# Patient Record
Sex: Female | Born: 1980 | Race: White | Hispanic: Yes | Marital: Married | Smoking: Never smoker
Health system: Southern US, Community
[De-identification: ages and names within clinical notes are randomized; demographics above are authoritative.]

## PROBLEM LIST (undated history)

## (undated) ENCOUNTER — Inpatient Hospital Stay (HOSPITAL_COMMUNITY): Payer: Self-pay

## (undated) DIAGNOSIS — F419 Anxiety disorder, unspecified: Secondary | ICD-10-CM

## (undated) DIAGNOSIS — O24419 Gestational diabetes mellitus in pregnancy, unspecified control: Secondary | ICD-10-CM

## (undated) DIAGNOSIS — F329 Major depressive disorder, single episode, unspecified: Secondary | ICD-10-CM

## (undated) DIAGNOSIS — F32A Depression, unspecified: Secondary | ICD-10-CM

## (undated) HISTORY — PX: APPENDECTOMY: SHX54

---

## 2005-08-21 ENCOUNTER — Emergency Department (HOSPITAL_COMMUNITY): Admission: EM | Admit: 2005-08-21 | Discharge: 2005-08-21 | Payer: Self-pay | Admitting: Emergency Medicine

## 2005-11-05 ENCOUNTER — Inpatient Hospital Stay (HOSPITAL_COMMUNITY): Admission: AD | Admit: 2005-11-05 | Discharge: 2005-11-05 | Payer: Self-pay | Admitting: Obstetrics and Gynecology

## 2006-01-14 ENCOUNTER — Inpatient Hospital Stay (HOSPITAL_COMMUNITY): Admission: AD | Admit: 2006-01-14 | Discharge: 2006-01-14 | Payer: Self-pay | Admitting: *Deleted

## 2006-05-25 ENCOUNTER — Emergency Department (HOSPITAL_COMMUNITY): Admission: EM | Admit: 2006-05-25 | Discharge: 2006-05-26 | Payer: Self-pay | Admitting: Emergency Medicine

## 2006-07-22 ENCOUNTER — Inpatient Hospital Stay (HOSPITAL_COMMUNITY): Admission: AD | Admit: 2006-07-22 | Discharge: 2006-07-22 | Payer: Self-pay | Admitting: Obstetrics and Gynecology

## 2007-06-04 ENCOUNTER — Ambulatory Visit: Payer: Self-pay | Admitting: Internal Medicine

## 2007-06-04 LAB — CONVERTED CEMR LAB
Basophils Absolute: 0 10*3/uL (ref 0.0–0.1)
CO2: 22 meq/L (ref 19–32)
Creatinine, Ser: 0.63 mg/dL (ref 0.40–1.20)
Eosinophils Relative: 1 % (ref 0–5)
Lymphocytes Relative: 36 % (ref 12–46)
Lymphs Abs: 3.3 10*3/uL (ref 0.7–3.3)
Monocytes Relative: 7 % (ref 3–11)
Neutro Abs: 5.1 10*3/uL (ref 1.7–7.7)
Neutrophils Relative %: 56 % (ref 43–77)
Potassium: 4.1 meq/L (ref 3.5–5.3)
RBC: 4.33 M/uL (ref 3.87–5.11)
Sodium: 138 meq/L (ref 135–145)
WBC: 9.1 10*3/uL (ref 4.0–10.5)

## 2007-06-07 ENCOUNTER — Ambulatory Visit: Payer: Self-pay | Admitting: *Deleted

## 2008-05-22 ENCOUNTER — Ambulatory Visit: Payer: Self-pay | Admitting: Family Medicine

## 2008-07-12 ENCOUNTER — Ambulatory Visit: Payer: Self-pay | Admitting: Internal Medicine

## 2008-07-12 ENCOUNTER — Encounter: Payer: Self-pay | Admitting: Family Medicine

## 2008-07-12 LAB — CONVERTED CEMR LAB
ALT: 16 units/L (ref 0–35)
Albumin: 4.7 g/dL (ref 3.5–5.2)
Alkaline Phosphatase: 103 units/L (ref 39–117)
BUN: 13 mg/dL (ref 6–23)
CO2: 23 meq/L (ref 19–32)
Calcium: 9.2 mg/dL (ref 8.4–10.5)
Glucose, Bld: 90 mg/dL (ref 70–99)
Hemoglobin: 14.3 g/dL (ref 12.0–15.0)
Lymphocytes Relative: 42 % (ref 12–46)
MCHC: 34.3 g/dL (ref 30.0–36.0)
Monocytes Absolute: 0.5 10*3/uL (ref 0.1–1.0)
Monocytes Relative: 7 % (ref 3–12)
Neutro Abs: 3.9 10*3/uL (ref 1.7–7.7)
Neutrophils Relative %: 49 % (ref 43–77)
Platelets: 253 10*3/uL (ref 150–400)
RDW: 12.3 % (ref 11.5–15.5)
Total CHOL/HDL Ratio: 4.2
Total Protein: 7.2 g/dL (ref 6.0–8.3)
VLDL: 35 mg/dL (ref 0–40)
WBC: 7.8 10*3/uL (ref 4.0–10.5)

## 2008-07-21 ENCOUNTER — Encounter: Payer: Self-pay | Admitting: Family Medicine

## 2008-07-21 ENCOUNTER — Ambulatory Visit: Payer: Self-pay | Admitting: Internal Medicine

## 2008-07-21 LAB — CONVERTED CEMR LAB
Chlamydia, DNA Probe: NEGATIVE
GC Probe Amp, Genital: NEGATIVE

## 2009-11-21 ENCOUNTER — Ambulatory Visit: Payer: Self-pay | Admitting: Obstetrics and Gynecology

## 2009-11-30 ENCOUNTER — Ambulatory Visit (HOSPITAL_COMMUNITY): Admission: RE | Admit: 2009-11-30 | Discharge: 2009-11-30 | Payer: Self-pay | Admitting: Obstetrics & Gynecology

## 2009-12-19 ENCOUNTER — Ambulatory Visit: Payer: Self-pay | Admitting: Obstetrics and Gynecology

## 2009-12-20 ENCOUNTER — Encounter: Payer: Self-pay | Admitting: Advanced Practice Midwife

## 2010-10-17 ENCOUNTER — Emergency Department (HOSPITAL_COMMUNITY)
Admission: EM | Admit: 2010-10-17 | Discharge: 2010-10-18 | Payer: Self-pay | Source: Home / Self Care | Admitting: Emergency Medicine

## 2010-10-21 LAB — URINALYSIS, ROUTINE W REFLEX MICROSCOPIC
Ketones, ur: NEGATIVE mg/dL
Protein, ur: NEGATIVE mg/dL
Specific Gravity, Urine: 1.021 (ref 1.005–1.030)
Urine Glucose, Fasting: >1000 mg/dL — AB
pH: 7 (ref 5.0–8.0)

## 2010-10-21 LAB — BASIC METABOLIC PANEL
BUN: 9 mg/dL (ref 6–23)
CO2: 29 mEq/L (ref 19–32)
Creatinine, Ser: 0.83 mg/dL (ref 0.4–1.2)
GFR calc non Af Amer: >60 mL/min (ref >60)
Sodium: 139 mEq/L (ref 135–145)

## 2010-10-21 LAB — DIFFERENTIAL
Basophils Absolute: 0 10*3/uL (ref 0.0–0.1)
Lymphs Abs: 3.8 10*3/uL (ref 0.7–4.0)
Monocytes Absolute: 1 10*3/uL (ref 0.1–1.0)

## 2010-10-21 LAB — CBC
Hemoglobin: 13.2 g/dL (ref 12.0–15.0)
MCH: 30.6 pg (ref 26.0–34.0)
MCHC: 35.6 g/dL (ref 30.0–36.0)
MCV: 86.1 fL (ref 78.0–100.0)
RBC: 4.31 MIL/uL (ref 3.87–5.11)
RDW: 12.5 % (ref 11.5–15.5)

## 2010-10-21 LAB — POCT PREGNANCY, URINE: Preg Test, Ur: NEGATIVE

## 2010-10-21 LAB — WET PREP, GENITAL: Trich, Wet Prep: NONE SEEN

## 2010-10-21 LAB — URINE MICROSCOPIC-ADD ON

## 2010-12-22 LAB — POCT URINALYSIS DIP (DEVICE)
Glucose, UA: NEGATIVE mg/dL
Nitrite: NEGATIVE
Specific Gravity, Urine: >=1.03 (ref 1.005–1.030)
Urobilinogen, UA: 0.2 mg/dL (ref 0.0–1.0)
pH: 5 (ref 5.0–8.0)

## 2011-05-16 ENCOUNTER — Emergency Department (HOSPITAL_COMMUNITY)
Admission: EM | Admit: 2011-05-16 | Discharge: 2011-05-17 | Disposition: A | Discharge status: Home / Self Care | Payer: Self-pay | Attending: Emergency Medicine | Admitting: Emergency Medicine

## 2011-05-16 DIAGNOSIS — M533 Sacrococcygeal disorders, not elsewhere classified: Secondary | ICD-10-CM | POA: Insufficient documentation

## 2011-05-16 DIAGNOSIS — R350 Frequency of micturition: Secondary | ICD-10-CM | POA: Insufficient documentation

## 2011-05-16 DIAGNOSIS — A499 Bacterial infection, unspecified: Secondary | ICD-10-CM | POA: Insufficient documentation

## 2011-05-16 DIAGNOSIS — M545 Low back pain, unspecified: Secondary | ICD-10-CM | POA: Insufficient documentation

## 2011-05-16 DIAGNOSIS — R109 Unspecified abdominal pain: Secondary | ICD-10-CM | POA: Insufficient documentation

## 2011-05-16 DIAGNOSIS — B9689 Other specified bacterial agents as the cause of diseases classified elsewhere: Secondary | ICD-10-CM | POA: Insufficient documentation

## 2011-05-16 DIAGNOSIS — R300 Dysuria: Secondary | ICD-10-CM | POA: Insufficient documentation

## 2011-05-16 DIAGNOSIS — N760 Acute vaginitis: Secondary | ICD-10-CM | POA: Insufficient documentation

## 2011-05-16 LAB — URINALYSIS, ROUTINE W REFLEX MICROSCOPIC
Glucose, UA: NEGATIVE mg/dL
Nitrite: NEGATIVE

## 2011-05-16 LAB — URINE MICROSCOPIC-ADD ON

## 2011-05-16 LAB — POCT PREGNANCY, URINE: Preg Test, Ur: NEGATIVE

## 2011-05-17 ENCOUNTER — Emergency Department (HOSPITAL_COMMUNITY): Payer: Self-pay

## 2011-05-17 LAB — WET PREP, GENITAL
Trich, Wet Prep: NONE SEEN
Yeast Wet Prep HPF POC: NONE SEEN

## 2011-05-19 LAB — GC/CHLAMYDIA PROBE AMP, GENITAL: GC Probe Amp, Genital: NEGATIVE

## 2011-09-30 NOTE — L&D Delivery Note (Signed)
Delivery Note At 2:33 PM a viable female was delivered via  (Presentation: ;  ).  APGAR: , ; weight .   Placenta status: , .  Cord:  with the following complications: .  Cord pH: not done  Anesthesia:   Episiotomy:  Lacerations:  Suture Repair: 2.0 Est. Blood Loss (mL):   Mom to postpartum.  Baby to nursery-stable.  MARSHALL,BERNARD A 06/28/2012, 2:40 PM

## 2011-11-07 ENCOUNTER — Encounter (HOSPITAL_COMMUNITY): Payer: Self-pay | Admitting: *Deleted

## 2011-11-07 ENCOUNTER — Inpatient Hospital Stay (HOSPITAL_COMMUNITY)
Admission: AD | Admit: 2011-11-07 | Discharge: 2011-11-07 | Disposition: A | Discharge status: Home / Self Care | Payer: Self-pay | Source: Ambulatory Visit | Attending: Obstetrics and Gynecology | Admitting: Obstetrics and Gynecology

## 2011-11-07 ENCOUNTER — Inpatient Hospital Stay (HOSPITAL_COMMUNITY): Payer: Self-pay

## 2011-11-07 DIAGNOSIS — O99891 Other specified diseases and conditions complicating pregnancy: Secondary | ICD-10-CM | POA: Insufficient documentation

## 2011-11-07 DIAGNOSIS — O26899 Other specified pregnancy related conditions, unspecified trimester: Secondary | ICD-10-CM

## 2011-11-07 DIAGNOSIS — R109 Unspecified abdominal pain: Secondary | ICD-10-CM | POA: Insufficient documentation

## 2011-11-07 HISTORY — DX: Major depressive disorder, single episode, unspecified: F32.9

## 2011-11-07 HISTORY — DX: Depression, unspecified: F32.A

## 2011-11-07 HISTORY — DX: Anxiety disorder, unspecified: F41.9

## 2011-11-07 LAB — URINALYSIS, ROUTINE W REFLEX MICROSCOPIC
Bilirubin Urine: NEGATIVE
Ketones, ur: NEGATIVE mg/dL
Leukocytes, UA: NEGATIVE
Nitrite: NEGATIVE
Protein, ur: NEGATIVE mg/dL
Urobilinogen, UA: 0.2 mg/dL (ref 0.0–1.0)
pH: 5.5 (ref 5.0–8.0)

## 2011-11-07 LAB — CBC
HCT: 37.8 % (ref 36.0–46.0)
Hemoglobin: 13.6 g/dL (ref 12.0–15.0)
MCH: 31 pg (ref 26.0–34.0)
MCV: 86.1 fL (ref 78.0–100.0)
Platelets: 243 10*3/uL (ref 150–400)
RBC: 4.39 MIL/uL (ref 3.87–5.11)
WBC: 14.4 10*3/uL — ABNORMAL HIGH (ref 4.0–10.5)

## 2011-11-07 NOTE — ED Provider Notes (Signed)
History   Pt presents today c/o lower abd pain that is worse with movement. She denies fever, vag dc, bleeding, or any other problems at this time.  Chief Complaint  Patient presents with  . Abdominal Pain   HPI  OB History    Grav Para Term Preterm Abortions TAB SAB Ect Mult Living   3 2 1 1      2       Past Medical History  Diagnosis Date  . Preterm labor   . Depression   . Anxiety     Past Surgical History  Procedure Date  . Appendectomy   . Appendectomy     Family History  Problem Relation Age of Onset  . Anesthesia problems Neg Hx     History  Substance Use Topics  . Smoking status: Not on file  . Smokeless tobacco: Not on file  . Alcohol Use:     Allergies: No Known Allergies  No prescriptions prior to admission    Review of Systems  Constitutional: Negative for fever and chills.  Eyes: Negative for blurred vision and double vision.  Cardiovascular: Negative for chest pain and palpitations.  Gastrointestinal: Positive for abdominal pain. Negative for nausea, vomiting, diarrhea and constipation.  Genitourinary: Negative for dysuria, urgency, frequency and hematuria.  Neurological: Negative for dizziness and headaches.  Psychiatric/Behavioral: Negative for depression and suicidal ideas.   Physical Exam   Blood pressure 112/75, pulse 85, temperature 99.1 F (37.3 C), temperature source Oral, resp. rate 20, height 5' 1.5" (1.562 m), weight 140 lb (63.504 kg), last menstrual period 09/26/2011, SpO2 100.00%.  Physical Exam  Nursing note and vitals reviewed. Constitutional: She is oriented to person, place, and time. She appears well-developed and well-nourished. No distress.  HENT:  Head: Normocephalic and atraumatic.  Eyes: EOM are normal. Pupils are equal, round, and reactive to light.  GI: Soft. She exhibits no distension and no mass. There is tenderness. There is no rebound and no guarding.  Genitourinary: No bleeding around the vagina. No vaginal  discharge found.       Cervix Lg/closed. Pt slightly tender over uterus. No adnexal masses.  Neurological: She is alert and oriented to person, place, and time.  Skin: Skin is warm and dry. She is not diaphoretic.  Psychiatric: She has a normal mood and affect. Her behavior is normal. Judgment and thought content normal.    MAU Course  Procedures  Results for orders placed during the hospital encounter of 11/07/11 (from the past 24 hour(s))  URINALYSIS, ROUTINE W REFLEX MICROSCOPIC     Status: Abnormal   Collection Time   11/07/11  1:20 PM      Component Value Range   Color, Urine YELLOW  YELLOW    APPearance HAZY (*) CLEAR    Specific Gravity, Urine 1.015  1.005 - 1.030    pH 5.5  5.0 - 8.0    Glucose, UA NEGATIVE  NEGATIVE (mg/dL)   Hgb urine dipstick SMALL (*) NEGATIVE    Bilirubin Urine NEGATIVE  NEGATIVE    Ketones, ur NEGATIVE  NEGATIVE (mg/dL)   Protein, ur NEGATIVE  NEGATIVE (mg/dL)   Urobilinogen, UA 0.2  0.0 - 1.0 (mg/dL)   Nitrite NEGATIVE  NEGATIVE    Leukocytes, UA NEGATIVE  NEGATIVE   URINE MICROSCOPIC-ADD ON     Status: Abnormal   Collection Time   11/07/11  1:20 PM      Component Value Range   Squamous Epithelial / LPF MANY (*) RARE  WBC, UA 0-2  <3 (WBC/hpf)   Bacteria, UA FEW (*) RARE   POCT PREGNANCY, URINE     Status: Abnormal   Collection Time   11/07/11  1:30 PM      Component Value Range   Preg Test, Ur POSITIVE (*) NEGATIVE   ABO/RH     Status: Normal   Collection Time   11/07/11  2:34 PM      Component Value Range   ABO/RH(D) B POS    CBC     Status: Abnormal   Collection Time   11/07/11  2:34 PM      Component Value Range   WBC 14.4 (*) 4.0 - 10.5 (K/uL)   RBC 4.39  3.87 - 5.11 (MIL/uL)   Hemoglobin 13.6  12.0 - 15.0 (g/dL)   HCT 16.1  09.6 - 04.5 (%)   MCV 86.1  78.0 - 100.0 (fL)   MCH 31.0  26.0 - 34.0 (pg)   MCHC 36.0  30.0 - 36.0 (g/dL)   RDW 40.9  81.1 - 91.4 (%)   Platelets 243  150 - 400 (K/uL)    US shows IUGS with yolk sac. No  adnexal masses.  Assessment and Plan  Pain in preg: discussed with pt at length. She is to begin prenatal care ASAP. Discussed diet, activity, risks, and precautions.  Clinton Gallant. Agripina Guyette III, DrHSc, MPAS, PA-C  11/07/2011, 4:01 PM   Henrietta Hoover, PA 11/07/11 1607

## 2011-11-07 NOTE — Progress Notes (Addendum)
Went to Dr [facility] 8 days ago, was told threatened miscarriage.  No bleeding.  abd pain, cramping and pain in back.   Blood was detected in urine.   implanon removed Dec 10.  Stools are soft, frequent.  Urine has a strong smell, denies pain or burning.

## 2011-11-08 NOTE — ED Provider Notes (Signed)
Agree with above note.  Michelle Cross 11/08/2011 6:07 AM   

## 2012-01-20 LAB — OB RESULTS CONSOLE ABO/RH

## 2012-01-20 LAB — OB RESULTS CONSOLE RUBELLA ANTIBODY, IGM: Rubella: IMMUNE

## 2012-01-20 LAB — OB RESULTS CONSOLE GC/CHLAMYDIA: Gonorrhea: NEGATIVE

## 2012-01-20 LAB — OB RESULTS CONSOLE ANTIBODY SCREEN: Antibody Screen: NEGATIVE

## 2012-01-20 LAB — OB RESULTS CONSOLE HEPATITIS B SURFACE ANTIGEN: Hepatitis B Surface Ag: NEGATIVE

## 2012-04-15 ENCOUNTER — Ambulatory Visit (HOSPITAL_COMMUNITY)
Admission: RE | Admit: 2012-04-15 | Discharge: 2012-04-15 | Disposition: A | Discharge status: Home / Self Care | Payer: Self-pay | Source: Ambulatory Visit | Attending: Obstetrics | Admitting: Obstetrics

## 2012-04-15 ENCOUNTER — Other Ambulatory Visit: Payer: Self-pay | Admitting: Obstetrics

## 2012-04-15 ENCOUNTER — Other Ambulatory Visit: Payer: Self-pay

## 2012-04-15 ENCOUNTER — Encounter: Payer: Self-pay | Attending: Obstetrics | Admitting: Dietician

## 2012-04-15 DIAGNOSIS — O99810 Abnormal glucose complicating pregnancy: Secondary | ICD-10-CM | POA: Insufficient documentation

## 2012-04-15 DIAGNOSIS — Z713 Dietary counseling and surveillance: Secondary | ICD-10-CM | POA: Insufficient documentation

## 2012-04-16 NOTE — ED Notes (Signed)
Diabetes Education: 04/15/2012  Hispanic patient of Dr. Francoise Ceo seen for counseling for GDM.  Speaks some English, but needs for the educational session to progress slowly and simply for her comprehension.  She is a G3 L2.  Gives a history of GDM with her last pregnancy.  Does not recall the diet.  Does noted that she did not take any medication for blood glucose, but that her baby had low blood glucose following the delivery.  She has a positive history of diabetes.  EDD is 07/02/2012.  HT: 60.45 inches  WT: 153.2 lb  Currently at 29 weeks.  Completed the review of the carb restricted diet for GDM, blood glucose monitoring and post-delivery self-care.  Provided handouts in Spanish:  Nutrition, Diabetes and Pregnancy and Carbohydrate Counting.  She is un-insured and we reviewed blood glucose monitoring with my demonstration meter and her glucose was 124 mg at 1 hr and 45 minutes post-lunch.  Instructed to purchase the Reli-On meeter and strips from [employer].  The meter kit, 100 strips and 100 lancets would cost approximately $48.00.  Provided brochure to facilitate the purchase.  She was instructed to call in her blood glucose readings to [facility] to Humphrey Rolls, RN at [phone] on Monday July 22.  Letter to Dr. Gaynell Face to follow.  Maggie Lennix Kneisel, RN, RD, CDE

## 2012-04-19 ENCOUNTER — Other Ambulatory Visit: Payer: Self-pay

## 2012-04-20 ENCOUNTER — Telehealth (HOSPITAL_COMMUNITY): Payer: Self-pay | Admitting: *Deleted

## 2012-04-20 NOTE — Telephone Encounter (Signed)
Pt called on Monday after office hours to report blood sugars.  Had a friend "Lurena Joiner" call as the pt does not speak english.  Blood sugars recorded on log and showed to Dr. Otho Perl.  Will monitor blood sugars for one more week.

## 2012-04-23 NOTE — Progress Notes (Signed)
MFM Note  [name] is a 31 year old G3P2 Hispanic female at 34 weeks who presents for consultation regarding her new diagnosis of gestational diabetes. The pregnancy has been uncomplicated so far. Her 3 hour OGTT results were as follows: 88, 224,194 and 102.  OB history:  G1: 2007, diet controlled GDM, preterm labor and vaginal delivery at 36 weeks, female, 6+2, [place] G2: 2009, uncomplicated, SVD, female, 9+2, [place] G3: current pregnancy  Medical history: none Surgical history: appendectomy Allergies: none Social history: works at home; denies tob/ETOH/drug use Family history: no congenital anomalies and no inheritable disorders  Assessment: 1) IUP at 29 weeks 2) Gestational diabetes 3) H/O LGA infant  [name] had diabetic teaching today by [facility]. She understands the goals of therapy include fasting blood sugars less than 90 mg/dl and 2-hour postprandial less than 120 mg/dl with avoidance of hypoglycemia. As pregnancy progresses if diet therapy is not successful in achieving adequate glycemic control, she will require medical therapy. I recommend first starting glyburide 2.5 mg orally with breakfast and increasing as needed to reach desired glycemic goals. If the maximum daily dose of glyburide is reached (10 mg bid) and glycemic control remains inadequate, the patient should be stared on insulin. If the patient requires medical treatment of her diabetes then fetal surveillance should be instituted with interval serial growth ultrasounds every 4 weeks and twice weekly antepartum testing beginning at 32 weeks.   We reviewed that the goal of good glycemic control is to prevent macrosomia and the subsequent complications such as birth trauma, increased need for C/S and metabolic disturbances in the neonate.  [name] will start the diabetic diet and begin checking fasting and 2 hour postprandial blood sugars. She was asked to contact us within a  week with her BSs.  It was a pleasure meeting [name].  (Face-to-face consultation with patient: 15 min)

## 2012-04-26 ENCOUNTER — Other Ambulatory Visit: Payer: Self-pay

## 2012-04-27 ENCOUNTER — Telehealth (HOSPITAL_COMMUNITY): Payer: Self-pay | Admitting: *Deleted

## 2012-04-27 NOTE — Telephone Encounter (Signed)
Blood sugars called in by pt's friend Lurena Joiner) as pt does not speak english.  Reviewed BSG's with Dr. Claudean Severance.  Will continue to monitor sugars for one more week.  Instructed to call sugars in again next week.  See scanned reports for log of blood sugars.

## 2012-05-03 ENCOUNTER — Other Ambulatory Visit: Payer: Self-pay

## 2012-05-03 ENCOUNTER — Telehealth (HOSPITAL_COMMUNITY): Payer: Self-pay | Admitting: *Deleted

## 2012-05-03 NOTE — Telephone Encounter (Signed)
Pt's friend (rebecca) called in blood sugars.  Reviewed blood sugars with Dr. Claudean Severance.  Will begin glyburide 2.5mg  bid.  Prescription called into [facility] pharmacy on [address].  Spoke with pharmacist about pt not speaking english and requested 2.5mg  tablets.  Called Lurena Joiner back and instructed the patient to take one tablet in am with breakfast and one tablet at night with dinner.  Verbalized understanding.  Lurena Joiner will call back in the morning to schedule appointment next week for twice weekly NST's.  See scanned reports for blood sugar log.

## 2012-05-04 ENCOUNTER — Telehealth (HOSPITAL_COMMUNITY): Payer: Self-pay | Admitting: *Deleted

## 2012-05-04 NOTE — Telephone Encounter (Signed)
Lurena Joiner and Otilio Miu called over speaker phone.  Appointment made for 8/12 at 10am.  Interpreter requested for appointment. Instructed to bring copy of blood sugars with her to appointment.  Pt verbalized understanding.

## 2012-05-10 ENCOUNTER — Ambulatory Visit (HOSPITAL_COMMUNITY)
Admission: RE | Admit: 2012-05-10 | Discharge: 2012-05-10 | Disposition: A | Discharge status: Home / Self Care | Payer: Self-pay | Source: Ambulatory Visit | Attending: Obstetrics | Admitting: Obstetrics

## 2012-05-10 DIAGNOSIS — O99810 Abnormal glucose complicating pregnancy: Secondary | ICD-10-CM | POA: Insufficient documentation

## 2012-05-10 DIAGNOSIS — Z8751 Personal history of pre-term labor: Secondary | ICD-10-CM | POA: Insufficient documentation

## 2012-05-10 NOTE — ED Notes (Signed)
Reviewed Blood sugars with Dr. Sherrie George.  Will continued glyburide 2.5mg  bid.  Interpreter with pt discussing blood sugar management. Verbalized understanding. See scanned documents for blood sugar logs.

## 2012-05-11 ENCOUNTER — Other Ambulatory Visit (HOSPITAL_COMMUNITY): Payer: Self-pay | Admitting: Obstetrics

## 2012-05-11 DIAGNOSIS — O24419 Gestational diabetes mellitus in pregnancy, unspecified control: Secondary | ICD-10-CM

## 2012-05-13 ENCOUNTER — Ambulatory Visit (HOSPITAL_COMMUNITY)
Admission: RE | Admit: 2012-05-13 | Discharge: 2012-05-13 | Disposition: A | Discharge status: Home / Self Care | Payer: Self-pay | Source: Ambulatory Visit | Attending: Obstetrics | Admitting: Obstetrics

## 2012-05-13 VITALS — BP 100/51 | HR 89 | Wt 152.0 lb

## 2012-05-13 DIAGNOSIS — O24419 Gestational diabetes mellitus in pregnancy, unspecified control: Secondary | ICD-10-CM

## 2012-05-13 DIAGNOSIS — O99810 Abnormal glucose complicating pregnancy: Secondary | ICD-10-CM | POA: Insufficient documentation

## 2012-05-13 DIAGNOSIS — Z8751 Personal history of pre-term labor: Secondary | ICD-10-CM | POA: Insufficient documentation

## 2012-05-13 NOTE — Progress Notes (Signed)
Michelle Cross was seen for ultrasound appointment today.  Please see [facility]-OBGYN report for details.

## 2012-05-17 ENCOUNTER — Ambulatory Visit (HOSPITAL_COMMUNITY)
Admission: RE | Admit: 2012-05-17 | Discharge: 2012-05-17 | Disposition: A | Discharge status: Home / Self Care | Payer: Self-pay | Source: Ambulatory Visit | Attending: Obstetrics | Admitting: Obstetrics

## 2012-05-17 ENCOUNTER — Other Ambulatory Visit: Payer: Self-pay

## 2012-05-17 VITALS — BP 96/57 | HR 88 | Wt 153.0 lb

## 2012-05-17 DIAGNOSIS — O99810 Abnormal glucose complicating pregnancy: Secondary | ICD-10-CM | POA: Insufficient documentation

## 2012-05-17 DIAGNOSIS — O24419 Gestational diabetes mellitus in pregnancy, unspecified control: Secondary | ICD-10-CM

## 2012-05-17 DIAGNOSIS — O368390 Maternal care for abnormalities of the fetal heart rate or rhythm, unspecified trimester, not applicable or unspecified: Secondary | ICD-10-CM | POA: Insufficient documentation

## 2012-05-20 ENCOUNTER — Ambulatory Visit (HOSPITAL_COMMUNITY)
Admission: RE | Admit: 2012-05-20 | Discharge: 2012-05-20 | Disposition: A | Discharge status: Home / Self Care | Payer: Self-pay | Source: Ambulatory Visit | Attending: Obstetrics | Admitting: Obstetrics

## 2012-05-20 VITALS — BP 105/55 | HR 90 | Wt 154.0 lb

## 2012-05-20 DIAGNOSIS — O24419 Gestational diabetes mellitus in pregnancy, unspecified control: Secondary | ICD-10-CM

## 2012-05-20 DIAGNOSIS — Z8751 Personal history of pre-term labor: Secondary | ICD-10-CM | POA: Insufficient documentation

## 2012-05-20 DIAGNOSIS — O99810 Abnormal glucose complicating pregnancy: Secondary | ICD-10-CM | POA: Insufficient documentation

## 2012-05-20 NOTE — Progress Notes (Signed)
Michelle Cross  was seen today for an ultrasound appointment.  See full report in [facility].  Alpha Gula, MD  Single living intrauterine pregnancy at 33 weeks 6 days. Interval fetal growth is appropriate (70th %tile) Normal amniotic fluid volume.  Recommend continued twice weekly fetal testing due to A2 GDM. Follow up growth scan in 3-4 weeks.

## 2012-05-24 ENCOUNTER — Ambulatory Visit (HOSPITAL_COMMUNITY)
Admission: RE | Admit: 2012-05-24 | Discharge: 2012-05-24 | Disposition: A | Discharge status: Home / Self Care | Payer: Self-pay | Source: Ambulatory Visit | Attending: Obstetrics | Admitting: Obstetrics

## 2012-05-24 ENCOUNTER — Other Ambulatory Visit: Payer: Self-pay

## 2012-05-24 VITALS — BP 92/53 | HR 87 | Wt 152.5 lb

## 2012-05-24 DIAGNOSIS — O24419 Gestational diabetes mellitus in pregnancy, unspecified control: Secondary | ICD-10-CM

## 2012-05-24 DIAGNOSIS — O99810 Abnormal glucose complicating pregnancy: Secondary | ICD-10-CM | POA: Insufficient documentation

## 2012-05-24 DIAGNOSIS — Z8751 Personal history of pre-term labor: Secondary | ICD-10-CM | POA: Insufficient documentation

## 2012-05-24 NOTE — ED Notes (Signed)
See scanned documents for blood sugar logs. 

## 2012-05-26 ENCOUNTER — Other Ambulatory Visit (HOSPITAL_COMMUNITY): Payer: Self-pay | Admitting: Obstetrics

## 2012-05-26 DIAGNOSIS — O24419 Gestational diabetes mellitus in pregnancy, unspecified control: Secondary | ICD-10-CM

## 2012-05-27 ENCOUNTER — Ambulatory Visit (HOSPITAL_COMMUNITY)
Admission: RE | Admit: 2012-05-27 | Discharge: 2012-05-27 | Disposition: A | Discharge status: Home / Self Care | Payer: Self-pay | Source: Ambulatory Visit | Attending: Obstetrics | Admitting: Obstetrics

## 2012-05-27 VITALS — BP 90/56 | HR 83 | Wt 152.0 lb

## 2012-05-27 DIAGNOSIS — O24419 Gestational diabetes mellitus in pregnancy, unspecified control: Secondary | ICD-10-CM

## 2012-05-27 DIAGNOSIS — O99810 Abnormal glucose complicating pregnancy: Secondary | ICD-10-CM | POA: Insufficient documentation

## 2012-05-27 DIAGNOSIS — Z8751 Personal history of pre-term labor: Secondary | ICD-10-CM | POA: Insufficient documentation

## 2012-05-31 ENCOUNTER — Other Ambulatory Visit: Payer: Self-pay

## 2012-06-01 ENCOUNTER — Other Ambulatory Visit (HOSPITAL_COMMUNITY): Payer: Self-pay | Admitting: Obstetrics

## 2012-06-01 ENCOUNTER — Other Ambulatory Visit: Payer: Self-pay

## 2012-06-01 ENCOUNTER — Ambulatory Visit (HOSPITAL_COMMUNITY)
Admission: RE | Admit: 2012-06-01 | Discharge: 2012-06-01 | Disposition: A | Discharge status: Home / Self Care | Payer: Self-pay | Source: Ambulatory Visit | Attending: Obstetrics | Admitting: Obstetrics

## 2012-06-01 VITALS — BP 98/60 | HR 95 | Wt 153.0 lb

## 2012-06-01 DIAGNOSIS — O99810 Abnormal glucose complicating pregnancy: Secondary | ICD-10-CM

## 2012-06-01 DIAGNOSIS — Z8751 Personal history of pre-term labor: Secondary | ICD-10-CM

## 2012-06-01 DIAGNOSIS — O24419 Gestational diabetes mellitus in pregnancy, unspecified control: Secondary | ICD-10-CM

## 2012-06-01 NOTE — ED Notes (Signed)
Blood sugars reviewed with MD.  Faxed to Dr. Gaynell Face and scanned into epic.

## 2012-06-04 ENCOUNTER — Ambulatory Visit (HOSPITAL_COMMUNITY)
Admission: RE | Admit: 2012-06-04 | Discharge: 2012-06-04 | Disposition: A | Discharge status: Home / Self Care | Payer: Self-pay | Source: Ambulatory Visit | Attending: Obstetrics | Admitting: Obstetrics

## 2012-06-04 ENCOUNTER — Other Ambulatory Visit: Payer: Self-pay

## 2012-06-04 VITALS — BP 96/64 | HR 75 | Wt 156.0 lb

## 2012-06-04 DIAGNOSIS — O24419 Gestational diabetes mellitus in pregnancy, unspecified control: Secondary | ICD-10-CM

## 2012-06-04 DIAGNOSIS — O99810 Abnormal glucose complicating pregnancy: Secondary | ICD-10-CM

## 2012-06-04 DIAGNOSIS — Z8751 Personal history of pre-term labor: Secondary | ICD-10-CM

## 2012-06-07 ENCOUNTER — Ambulatory Visit (HOSPITAL_COMMUNITY)
Admission: RE | Admit: 2012-06-07 | Discharge: 2012-06-07 | Disposition: A | Discharge status: Home / Self Care | Payer: Self-pay | Source: Ambulatory Visit | Attending: Obstetrics | Admitting: Obstetrics

## 2012-06-07 ENCOUNTER — Encounter (HOSPITAL_COMMUNITY): Payer: Self-pay

## 2012-06-07 ENCOUNTER — Other Ambulatory Visit: Payer: Self-pay

## 2012-06-07 VITALS — BP 103/60 | HR 84 | Wt 156.0 lb

## 2012-06-07 DIAGNOSIS — O24419 Gestational diabetes mellitus in pregnancy, unspecified control: Secondary | ICD-10-CM

## 2012-06-07 DIAGNOSIS — O99810 Abnormal glucose complicating pregnancy: Secondary | ICD-10-CM | POA: Insufficient documentation

## 2012-06-07 HISTORY — DX: Gestational diabetes mellitus in pregnancy, unspecified control: O24.419

## 2012-06-10 ENCOUNTER — Ambulatory Visit (HOSPITAL_COMMUNITY)
Admission: RE | Admit: 2012-06-10 | Discharge: 2012-06-10 | Disposition: A | Discharge status: Home / Self Care | Payer: Self-pay | Source: Ambulatory Visit | Attending: Obstetrics | Admitting: Obstetrics

## 2012-06-10 VITALS — BP 104/72 | HR 93 | Wt 158.0 lb

## 2012-06-10 DIAGNOSIS — Z8751 Personal history of pre-term labor: Secondary | ICD-10-CM | POA: Insufficient documentation

## 2012-06-10 DIAGNOSIS — O24419 Gestational diabetes mellitus in pregnancy, unspecified control: Secondary | ICD-10-CM

## 2012-06-10 DIAGNOSIS — O99810 Abnormal glucose complicating pregnancy: Secondary | ICD-10-CM | POA: Insufficient documentation

## 2012-06-14 ENCOUNTER — Ambulatory Visit (HOSPITAL_COMMUNITY)
Admission: RE | Admit: 2012-06-14 | Discharge: 2012-06-14 | Disposition: A | Discharge status: Home / Self Care | Payer: Self-pay | Source: Ambulatory Visit | Attending: Obstetrics | Admitting: Obstetrics

## 2012-06-14 ENCOUNTER — Other Ambulatory Visit: Payer: Self-pay

## 2012-06-14 VITALS — BP 106/63 | HR 88 | Wt 156.5 lb

## 2012-06-14 DIAGNOSIS — O99810 Abnormal glucose complicating pregnancy: Secondary | ICD-10-CM | POA: Insufficient documentation

## 2012-06-14 DIAGNOSIS — O24419 Gestational diabetes mellitus in pregnancy, unspecified control: Secondary | ICD-10-CM

## 2012-06-14 DIAGNOSIS — Z8751 Personal history of pre-term labor: Secondary | ICD-10-CM | POA: Insufficient documentation

## 2012-06-15 ENCOUNTER — Other Ambulatory Visit (HOSPITAL_COMMUNITY): Payer: Self-pay | Admitting: Obstetrics

## 2012-06-15 DIAGNOSIS — O24919 Unspecified diabetes mellitus in pregnancy, unspecified trimester: Secondary | ICD-10-CM

## 2012-06-17 ENCOUNTER — Ambulatory Visit (HOSPITAL_COMMUNITY)
Admission: RE | Admit: 2012-06-17 | Discharge: 2012-06-17 | Disposition: A | Discharge status: Home / Self Care | Payer: Self-pay | Source: Ambulatory Visit | Attending: Obstetrics | Admitting: Obstetrics

## 2012-06-17 VITALS — BP 109/64 | HR 86 | Wt 158.5 lb

## 2012-06-17 DIAGNOSIS — Z8751 Personal history of pre-term labor: Secondary | ICD-10-CM | POA: Insufficient documentation

## 2012-06-17 DIAGNOSIS — O99810 Abnormal glucose complicating pregnancy: Secondary | ICD-10-CM | POA: Insufficient documentation

## 2012-06-17 DIAGNOSIS — O24419 Gestational diabetes mellitus in pregnancy, unspecified control: Secondary | ICD-10-CM

## 2012-06-17 DIAGNOSIS — O24919 Unspecified diabetes mellitus in pregnancy, unspecified trimester: Secondary | ICD-10-CM

## 2012-06-18 ENCOUNTER — Telehealth (HOSPITAL_COMMUNITY): Payer: Self-pay | Admitting: *Deleted

## 2012-06-18 ENCOUNTER — Encounter (HOSPITAL_COMMUNITY): Payer: Self-pay | Admitting: *Deleted

## 2012-06-18 NOTE — Telephone Encounter (Signed)
Preadmission screen  

## 2012-06-21 ENCOUNTER — Other Ambulatory Visit: Payer: Self-pay

## 2012-06-21 ENCOUNTER — Other Ambulatory Visit (HOSPITAL_COMMUNITY): Payer: Self-pay | Admitting: Obstetrics and Gynecology

## 2012-06-21 ENCOUNTER — Ambulatory Visit (HOSPITAL_COMMUNITY)
Admission: RE | Admit: 2012-06-21 | Discharge: 2012-06-21 | Disposition: A | Discharge status: Home / Self Care | Payer: Self-pay | Source: Ambulatory Visit | Attending: Obstetrics | Admitting: Obstetrics

## 2012-06-21 VITALS — BP 112/70 | HR 90 | Wt 158.0 lb

## 2012-06-21 DIAGNOSIS — O24419 Gestational diabetes mellitus in pregnancy, unspecified control: Secondary | ICD-10-CM

## 2012-06-21 DIAGNOSIS — O288 Other abnormal findings on antenatal screening of mother: Secondary | ICD-10-CM

## 2012-06-21 DIAGNOSIS — Z3689 Encounter for other specified antenatal screening: Secondary | ICD-10-CM | POA: Insufficient documentation

## 2012-06-21 DIAGNOSIS — O368390 Maternal care for abnormalities of the fetal heart rate or rhythm, unspecified trimester, not applicable or unspecified: Secondary | ICD-10-CM | POA: Insufficient documentation

## 2012-06-21 NOTE — Progress Notes (Signed)
Ms. Patrick Jupiter had an ultrasound appointment today.  Please see [facility]-OB/GYN report for details.  Impression Active singleton fetus. BPP 8/8 (Reassuring) Normal amniotic fluid volume  Recommendations 1. Continue surveillance as previously scheduled. 2. Follow as clinically indicated.  Rogelia Boga, MD, MS, FACOG Assistant Professor Section of Maternal-Fetal Medicine [facility]

## 2012-06-22 ENCOUNTER — Other Ambulatory Visit (HOSPITAL_COMMUNITY): Payer: Self-pay | Admitting: Obstetrics

## 2012-06-22 DIAGNOSIS — O24419 Gestational diabetes mellitus in pregnancy, unspecified control: Secondary | ICD-10-CM

## 2012-06-24 ENCOUNTER — Encounter (HOSPITAL_COMMUNITY): Payer: Self-pay

## 2012-06-24 ENCOUNTER — Other Ambulatory Visit (HOSPITAL_COMMUNITY): Payer: Self-pay | Admitting: Obstetrics

## 2012-06-24 ENCOUNTER — Ambulatory Visit (HOSPITAL_COMMUNITY)
Admission: RE | Admit: 2012-06-24 | Discharge: 2012-06-24 | Disposition: A | Discharge status: Home / Self Care | Payer: Self-pay | Source: Ambulatory Visit | Attending: Obstetrics | Admitting: Obstetrics

## 2012-06-24 VITALS — BP 115/68 | HR 88 | Wt 159.5 lb

## 2012-06-24 DIAGNOSIS — O24419 Gestational diabetes mellitus in pregnancy, unspecified control: Secondary | ICD-10-CM

## 2012-06-24 DIAGNOSIS — Z8751 Personal history of pre-term labor: Secondary | ICD-10-CM | POA: Insufficient documentation

## 2012-06-24 DIAGNOSIS — O99810 Abnormal glucose complicating pregnancy: Secondary | ICD-10-CM | POA: Insufficient documentation

## 2012-06-24 NOTE — Progress Notes (Signed)
[name] had an ultrasound appointment today.  Please see AS-OB/GYN report for details.  Comments There is an active singleton fetus with no apparent dysmorphic features on today's routine anatomic re-examination.  The biometry suggests a fetus with an EFW (3753gm) at greater than the 90th percentile and AC greater than the 97th percentile for gestational age.    Impression Active singleton fetus. Accelerated interval growth. Normal amniotic fluid volume   Recommendations  Follow as clinically indicated.  Rogelia Boga, MD, MS, FACOG Assistant Professor Section of Maternal-Fetal Medicine [facility]

## 2012-06-28 ENCOUNTER — Encounter (HOSPITAL_COMMUNITY): Payer: Self-pay

## 2012-06-28 ENCOUNTER — Inpatient Hospital Stay (HOSPITAL_COMMUNITY)
Admission: RE | Admit: 2012-06-28 | Discharge: 2012-06-30 | DRG: 775 | Disposition: A | Discharge status: Home / Self Care | Payer: Medicaid Other | Source: Ambulatory Visit | Attending: Obstetrics | Admitting: Obstetrics

## 2012-06-28 ENCOUNTER — Encounter (HOSPITAL_COMMUNITY): Payer: Self-pay | Admitting: Anesthesiology

## 2012-06-28 ENCOUNTER — Inpatient Hospital Stay (HOSPITAL_COMMUNITY): Payer: Medicaid Other | Admitting: Anesthesiology

## 2012-06-28 DIAGNOSIS — O24419 Gestational diabetes mellitus in pregnancy, unspecified control: Secondary | ICD-10-CM

## 2012-06-28 DIAGNOSIS — O99814 Abnormal glucose complicating childbirth: Principal | ICD-10-CM | POA: Diagnosis present

## 2012-06-28 LAB — CBC
Hemoglobin: 14.1 g/dL (ref 12.0–15.0)
MCH: 31.3 pg (ref 26.0–34.0)
MCHC: 36 g/dL (ref 30.0–36.0)
Platelets: 168 10*3/uL (ref 150–400)

## 2012-06-28 LAB — RPR: RPR Ser Ql: NONREACTIVE

## 2012-06-28 MED ORDER — SIMETHICONE 80 MG PO CHEW: 80.0000 mg | CHEWABLE_TABLET | ORAL | Status: DC | PRN

## 2012-06-28 MED ORDER — DIPHENHYDRAMINE HCL 25 MG PO CAPS
25.0000 mg | ORAL_CAPSULE | Freq: Four times a day (QID) | ORAL | Status: DC | PRN
  Administered 2012-06-28: 25 mg via ORAL
  Filled 2012-06-28: qty 1

## 2012-06-28 MED ORDER — LACTATED RINGERS IV SOLN: 500.0000 mL | INTRAVENOUS | Status: DC | PRN

## 2012-06-28 MED ORDER — SODIUM BICARBONATE 8.4 % IV SOLN
INTRAVENOUS | Status: DC | PRN
  Administered 2012-06-28: 4 mL via EPIDURAL

## 2012-06-28 MED ORDER — OXYTOCIN 40 UNITS IN LACTATED RINGERS INFUSION - SIMPLE MED
1.0000 m[IU]/min | INTRAVENOUS | Status: DC
  Administered 2012-06-28: 41.667 m[IU]/min via INTRAVENOUS
  Administered 2012-06-28: 2 m[IU]/min via INTRAVENOUS

## 2012-06-28 MED ORDER — LANOLIN HYDROUS EX OINT: TOPICAL_OINTMENT | CUTANEOUS | Status: DC | PRN

## 2012-06-28 MED ORDER — TERBUTALINE SULFATE 1 MG/ML IJ SOLN: 0.2500 mg | Freq: Once | INTRAMUSCULAR | Status: DC | PRN

## 2012-06-28 MED ORDER — EPHEDRINE 5 MG/ML INJ
10.0000 mg | INTRAVENOUS | Status: DC | PRN
  Filled 2012-06-28: qty 4

## 2012-06-28 MED ORDER — LIDOCAINE HCL (PF) 1 % IJ SOLN: 30.0000 mL | INTRAMUSCULAR | Status: DC | PRN

## 2012-06-28 MED ORDER — IBUPROFEN 600 MG PO TABS
600.0000 mg | ORAL_TABLET | Freq: Four times a day (QID) | ORAL | Status: DC
  Administered 2012-06-28 – 2012-06-30 (×9): 600 mg via ORAL
  Filled 2012-06-28 (×9): qty 1

## 2012-06-28 MED ORDER — BUTORPHANOL TARTRATE 1 MG/ML IJ SOLN: 1.0000 mg | INTRAMUSCULAR | Status: DC | PRN

## 2012-06-28 MED ORDER — SENNOSIDES-DOCUSATE SODIUM 8.6-50 MG PO TABS
2.0000 | ORAL_TABLET | Freq: Every day | ORAL | Status: DC
  Administered 2012-06-28 – 2012-06-29 (×2): 2 via ORAL

## 2012-06-28 MED ORDER — DIBUCAINE 1 % RE OINT: 1.0000 "application " | TOPICAL_OINTMENT | RECTAL | Status: DC | PRN

## 2012-06-28 MED ORDER — ACETAMINOPHEN 325 MG PO TABS: 650.0000 mg | ORAL_TABLET | ORAL | Status: DC | PRN

## 2012-06-28 MED ORDER — ONDANSETRON HCL 4 MG/2ML IJ SOLN: 4.0000 mg | Freq: Four times a day (QID) | INTRAMUSCULAR | Status: DC | PRN

## 2012-06-28 MED ORDER — FENTANYL 2.5 MCG/ML BUPIVACAINE 1/10 % EPIDURAL INFUSION (WH - ANES)
14.0000 mL/h | INTRAMUSCULAR | Status: DC
  Filled 2012-06-28: qty 60

## 2012-06-28 MED ORDER — PHENYLEPHRINE 40 MCG/ML (10ML) SYRINGE FOR IV PUSH (FOR BLOOD PRESSURE SUPPORT): 80.0000 ug | PREFILLED_SYRINGE | INTRAVENOUS | Status: DC | PRN

## 2012-06-28 MED ORDER — FENTANYL 2.5 MCG/ML BUPIVACAINE 1/10 % EPIDURAL INFUSION (WH - ANES)
INTRAMUSCULAR | Status: DC | PRN
  Administered 2012-06-28: 14 mL/h via EPIDURAL

## 2012-06-28 MED ORDER — CITRIC ACID-SODIUM CITRATE 334-500 MG/5ML PO SOLN: 30.0000 mL | ORAL | Status: DC | PRN

## 2012-06-28 MED ORDER — IBUPROFEN 600 MG PO TABS: 600.0000 mg | ORAL_TABLET | Freq: Four times a day (QID) | ORAL | Status: DC | PRN

## 2012-06-28 MED ORDER — PRENATAL MULTIVITAMIN CH
1.0000 | ORAL_TABLET | Freq: Every day | ORAL | Status: DC
  Administered 2012-06-28 – 2012-06-30 (×3): 1 via ORAL
  Filled 2012-06-28 (×4): qty 1

## 2012-06-28 MED ORDER — OXYTOCIN BOLUS FROM INFUSION
500.0000 mL | Freq: Once | INTRAVENOUS | Status: DC
  Filled 2012-06-28: qty 500

## 2012-06-28 MED ORDER — LACTATED RINGERS IV SOLN
500.0000 mL | Freq: Once | INTRAVENOUS | Status: AC
  Administered 2012-06-28: 500 mL via INTRAVENOUS

## 2012-06-28 MED ORDER — PHENYLEPHRINE 40 MCG/ML (10ML) SYRINGE FOR IV PUSH (FOR BLOOD PRESSURE SUPPORT)
80.0000 ug | PREFILLED_SYRINGE | INTRAVENOUS | Status: DC | PRN
  Filled 2012-06-28: qty 5

## 2012-06-28 MED ORDER — INFLUENZA VIRUS VACC SPLIT PF IM SUSP
0.5000 mL | INTRAMUSCULAR | Status: AC
  Administered 2012-06-29: 0.5 mL via INTRAMUSCULAR
  Filled 2012-06-28: qty 0.5

## 2012-06-28 MED ORDER — LACTATED RINGERS IV SOLN
INTRAVENOUS | Status: DC
  Administered 2012-06-28 (×2): via INTRAVENOUS

## 2012-06-28 MED ORDER — BENZOCAINE-MENTHOL 20-0.5 % EX AERO: 1.0000 "application " | INHALATION_SPRAY | CUTANEOUS | Status: DC | PRN

## 2012-06-28 MED ORDER — TETANUS-DIPHTH-ACELL PERTUSSIS 5-2.5-18.5 LF-MCG/0.5 IM SUSP
0.5000 mL | Freq: Once | INTRAMUSCULAR | Status: AC
  Administered 2012-06-29: 0.5 mL via INTRAMUSCULAR
  Filled 2012-06-28: qty 0.5

## 2012-06-28 MED ORDER — OXYCODONE-ACETAMINOPHEN 5-325 MG PO TABS
1.0000 | ORAL_TABLET | ORAL | Status: DC | PRN
  Administered 2012-06-28 – 2012-06-29 (×3): 1 via ORAL
  Filled 2012-06-28 (×3): qty 1

## 2012-06-28 MED ORDER — ZOLPIDEM TARTRATE 5 MG PO TABS: 5.0000 mg | ORAL_TABLET | Freq: Every evening | ORAL | Status: DC | PRN

## 2012-06-28 MED ORDER — EPHEDRINE 5 MG/ML INJ: 10.0000 mg | INTRAVENOUS | Status: DC | PRN

## 2012-06-28 MED ORDER — FERROUS SULFATE 325 (65 FE) MG PO TABS
325.0000 mg | ORAL_TABLET | Freq: Two times a day (BID) | ORAL | Status: DC
  Administered 2012-06-29 – 2012-06-30 (×4): 325 mg via ORAL
  Filled 2012-06-28 (×4): qty 1

## 2012-06-28 MED ORDER — OXYCODONE-ACETAMINOPHEN 5-325 MG PO TABS: 1.0000 | ORAL_TABLET | ORAL | Status: DC | PRN

## 2012-06-28 MED ORDER — OXYTOCIN 40 UNITS IN LACTATED RINGERS INFUSION - SIMPLE MED
62.5000 mL/h | Freq: Once | INTRAVENOUS | Status: AC
  Administered 2012-06-28: 999 mL/h via INTRAVENOUS
  Filled 2012-06-28: qty 1000

## 2012-06-28 MED ORDER — ONDANSETRON HCL 4 MG PO TABS: 4.0000 mg | ORAL_TABLET | ORAL | Status: DC | PRN

## 2012-06-28 MED ORDER — DIPHENHYDRAMINE HCL 50 MG/ML IJ SOLN: 12.5000 mg | INTRAMUSCULAR | Status: DC | PRN

## 2012-06-28 MED ORDER — WITCH HAZEL-GLYCERIN EX PADS: 1.0000 "application " | MEDICATED_PAD | CUTANEOUS | Status: DC | PRN

## 2012-06-28 MED ORDER — ONDANSETRON HCL 4 MG/2ML IJ SOLN: 4.0000 mg | INTRAMUSCULAR | Status: DC | PRN

## 2012-06-28 NOTE — Anesthesia Procedure Notes (Signed)
Epidural Patient location during procedure: OB  Preanesthetic Checklist Completed: patient identified, site marked, surgical consent, pre-op evaluation, timeout performed, IV checked, risks and benefits discussed and monitors and equipment checked  Epidural Patient position: sitting Prep: site prepped and draped and DuraPrep Patient monitoring: continuous pulse ox and blood pressure Approach: midline Injection technique: LOR air  Needle:  Needle type: Tuohy  Needle gauge: 17 G Needle length: 9 cm and 9 Needle insertion depth: 5 cm cm Catheter type: closed end flexible Catheter size: 19 Gauge Catheter at skin depth: 10 cm Test dose: negative  Assessment Events: blood not aspirated, injection not painful, no injection resistance, negative IV test and no paresthesia  Additional Notes Dosing of Epidural:  1st dose, through needle ............................................. epi 1:200K + Xylocaine 40 mg  2nd dose, through catheter, after waiting 3 minutes.....epi 1:200K + Xylocaine 40 mg  3rd dose, through catheter after waiting 3 minutes .............................Marcaine   4mg   ( mg Marcaine are expressed as equivilent  cc's medication removed from the 0.1%Bupiv / fentanyl syringe from L&D pump)  ( 2% Xylo charted as a single dose in Epic Meds for ease of charting; actual dosing was fractionated as above, for saftey's sake)  As each dose occurred, patient was free of IV sx; and patient exhibited no evidence of SA injection.  Patient is more comfortable after epidural dosed. Please see RN's note for documentation of vital signs,and FHR which are stable.  Patient reminded not to try to ambulate with numb legs, and that an RN must be present the 1st time she attempts to get up.    

## 2012-06-28 NOTE — H&P (Signed)
This is Dr. Francoise Ceo dictating the history and physical on  Michelle Cross she's a 31 year old gravida 3 para [birth year]2000 at 39 weeks and 4 days her EDC is 07/02/2012 she was brought in for induction because of diabetes she's been on glyburide 2.5 mg by mouth twice a day she is O- GBS and her cervix is 2-3 cm vertex -2-3 and the cervix is 80% I'm not him is performed the fluids clear and she is on low-dose Pitocin and Past medical history negative Past surgical history negative Social history negative System review negative Physical exam well-developed female in labor HEENT negative Lungs clear to P&A Breasts negative Heart regular rhythm no murmurs no gallops Lungs clear Abdomen term Pelvic as described above Extremities negative

## 2012-06-28 NOTE — Anesthesia Preprocedure Evaluation (Addendum)
Anesthesia Evaluation  Patient identified by MRN, date of birth, ID band Patient awake    Reviewed: Allergy & Precautions, H&P , Patient's Chart, lab work & pertinent test results  Airway Mallampati: II  TM Distance: >3 FB Neck ROM: full    Dental  (+) Teeth Intact   Pulmonary  breath sounds clear to auscultation        Cardiovascular Rhythm:regular Rate:Normal     Neuro/Psych    GI/Hepatic GERD-  ,  Endo/Other  diabetes, Gestational  Renal/GU      Musculoskeletal   Abdominal   Peds  Hematology   Anesthesia Other Findings       Reproductive/Obstetrics (+) Pregnancy                             Anesthesia Physical Anesthesia Plan  ASA: II  Anesthesia Plan: Epidural   Post-op Pain Management:    Induction:   Airway Management Planned:   Additional Equipment:   Intra-op Plan:   Post-operative Plan:   Informed Consent: I have reviewed the patients History and Physical, chart, labs and discussed the procedure including the risks, benefits and alternatives for the proposed anesthesia with the patient or authorized representative who has indicated his/her understanding and acceptance.   Dental Advisory Given  Plan Discussed with:   Anesthesia Plan Comments: (Labs checked- platelets confirmed with RN in room. Fetal heart tracing, per RN, reported to be stable enough for sitting procedure. Discussed epidural, and patient consents to the procedure:  included risk of possible headache,backache, failed block, allergic reaction, and nerve injury. This patient was asked if she had any questions or concerns before the procedure started.)        Anesthesia Quick Evaluation  

## 2012-06-28 NOTE — Anesthesia Postprocedure Evaluation (Signed)
  Anesthesia Post-op Note  Patient: Michelle Cross  Procedure(s) Performed: * No procedures listed *  Patient Location: Mother/Baby  Anesthesia Type: Epidural  Level of Consciousness: awake, alert  and oriented  Airway and Oxygen Therapy: Patient Spontanous Breathing  Post-op Pain: none  Post-op Assessment: Post-op Vital signs reviewed and Patient's Cardiovascular Status Stable  Post-op Vital Signs: Reviewed and stable  Complications: No apparent anesthesia complications

## 2012-06-28 NOTE — Progress Notes (Signed)
In-house interpreter at bedside to assist with mother/baby education as noted in education charting.

## 2012-06-29 LAB — CBC
HCT: 39.1 % (ref 36.0–46.0)
Platelets: 156 10*3/uL (ref 150–400)
WBC: 15.9 10*3/uL — ABNORMAL HIGH (ref 4.0–10.5)

## 2012-06-29 NOTE — Progress Notes (Signed)
Patient ID: Michelle Cross, female   DOB: 24-Aug-1981, 31 y.o.   MRN: [id number] Postpartum day one Vital signs normal Fundus firm Lochia moderate Legs negative No complaints

## 2012-06-29 NOTE — Progress Notes (Signed)
UR chart review completed.  

## 2012-06-30 NOTE — Discharge Summary (Signed)
Obstetric Discharge Summary Reason for Admission: induction of labor Prenatal Procedures: none Intrapartum Procedures: spontaneous vaginal delivery Postpartum Procedures: none Complications-Operative and Postpartum: none Hemoglobin  Date Value Range Status  06/29/2012 13.6  12.0 - 15.0 g/dL Final     HCT  Date Value Range Status  06/29/2012 39.1  36.0 - 46.0 % Final    Physical Exam:  General: alert Lochia: appropriate Uterine Fundus: firm Incision: healing well DVT Evaluation: No evidence of DVT seen on physical exam.  Discharge Diagnoses: Term Pregnancy-delivered  Discharge Information: Date: 06/30/2012 Activity: pelvic rest Diet: routine Medications: Percocet Condition: stable Instructions: refer to practice specific booklet Discharge to: home Follow-up Information    Call in 6 weeks to follow up.         Newborn Data: Live born female  Birth Weight: 8 lb 13.2 oz (4003 g) APGAR: 9, 9  Home with mother.  MARSHALL,BERNARD A 06/30/2012, 6:19 AM

## 2012-07-02 ENCOUNTER — Inpatient Hospital Stay (HOSPITAL_COMMUNITY): Admission: AD | Admit: 2012-07-02 | Payer: Self-pay | Source: Home / Self Care | Admitting: Obstetrics

## 2013-06-04 ENCOUNTER — Ambulatory Visit: Payer: Self-pay | Admitting: Family Medicine

## 2013-06-04 VITALS — BP 108/64 | HR 110 | Temp 99.2°F | Resp 18 | Ht 61.0 in | Wt 133.0 lb

## 2013-06-04 DIAGNOSIS — J029 Acute pharyngitis, unspecified: Secondary | ICD-10-CM

## 2013-06-04 DIAGNOSIS — B950 Streptococcus, group A, as the cause of diseases classified elsewhere: Secondary | ICD-10-CM

## 2013-06-04 LAB — POCT RAPID STREP A (OFFICE): Rapid Strep A Screen: POSITIVE — AB

## 2013-06-04 MED ORDER — AMOXICILLIN 500 MG PO TABS: 1000.0000 mg | ORAL_TABLET | Freq: Three times a day (TID) | ORAL | Status: AC

## 2013-06-04 MED ORDER — HYDROCODONE-ACETAMINOPHEN 7.5-325 MG/15ML PO SOLN: 10.0000 mL | Freq: Four times a day (QID) | ORAL | Status: DC | PRN

## 2013-06-04 MED ORDER — FIRST-DUKES MOUTHWASH MT SUSP: 5.0000 mL | OROMUCOSAL | Status: DC | PRN

## 2013-06-04 NOTE — Progress Notes (Signed)
Subjective:    Patient ID: Michelle Cross, female    DOB: 1981-03-08, 32 y.o.   MRN: [id number] Chief Complaint  Patient presents with  . Sore Throat    since last night  . Fever    last night  . Generalized Body Aches    last night  . Headache    last night    HPI  Yesterday started c/o glands swollen and tender/throat hurting and then last night body aches, headaches, hurts to swallow, fever/chills/sweats.  Children have been sick with URI sxs but seem to have gotten better.  Has been using some advil for relieve but hasn't helped much.  Sleeping tons. Hurts to much to swallow so not eating or drinking.  GI/GU nml  Friend interpreting as pt speaks Spanish only.   Past Medical History  Diagnosis Date  . Depression   . Anxiety   . Gestational diabetes     glyburide  . Preterm labor     delivery   No current outpatient prescriptions on file prior to visit.   No current facility-administered medications on file prior to visit.   No Known Allergies   Review of Systems  Constitutional: Positive for fever, chills, diaphoresis, activity change, appetite change and fatigue.  HENT: Positive for sore throat, trouble swallowing, neck pain and voice change. Negative for ear pain, nosebleeds, congestion, rhinorrhea, sneezing, mouth sores, neck stiffness, postnasal drip, sinus pressure and ear discharge.   Eyes: Negative for pain and itching.  Respiratory: Negative for cough and shortness of breath.   Cardiovascular: Negative for chest pain.  Gastrointestinal: Negative for nausea, vomiting, abdominal pain, diarrhea and constipation.  Genitourinary: Negative for dysuria.  Musculoskeletal: Positive for myalgias. Negative for joint swelling, arthralgias and gait problem.  Skin: Negative for rash.  Neurological: Positive for weakness and headaches. Negative for dizziness and syncope.  Hematological: Positive for adenopathy.  Psychiatric/Behavioral: Positive for sleep  disturbance.      BP 108/64  Pulse 110  Temp(Src) 99.2 F (37.3 C) (Oral)  Resp 18  Ht 5\' 1"  (1.549 m)  Wt 133 lb (60.328 kg)  BMI 25.14 kg/m2  SpO2 99%  LMP 05/26/2013 Objective:   Physical Exam  Constitutional: She is oriented to person, place, and time. She appears well-developed and well-nourished. She appears lethargic. She appears ill. No distress.  HENT:  Head: Normocephalic and atraumatic.  Right Ear: Tympanic membrane, external ear and ear canal normal.  Left Ear: Tympanic membrane, external ear and ear canal normal.  Nose: Nose normal. No mucosal edema or rhinorrhea.  Mouth/Throat: Uvula is midline and mucous membranes are normal. Mucous membranes are not pale and not dry. No trismus in the jaw. No edematous. Oropharyngeal exudate, posterior oropharyngeal edema and posterior oropharyngeal erythema present. No tonsillar abscesses.  Eyes: Conjunctivae are normal. Right eye exhibits no discharge. Left eye exhibits no discharge. No scleral icterus.  Neck: Normal range of motion. Neck supple.  Cardiovascular: Normal rate, regular rhythm, normal heart sounds and intact distal pulses.   Pulmonary/Chest: Effort normal and breath sounds normal. No respiratory distress.  Lymphadenopathy:       Head (right side): Submandibular and tonsillar adenopathy present. No preauricular, no posterior auricular and no occipital adenopathy present.       Head (left side): Submandibular and tonsillar adenopathy present. No preauricular, no posterior auricular and no occipital adenopathy present.    She has cervical adenopathy.       Right cervical: Superficial cervical adenopathy present. No posterior cervical adenopathy present.  Left cervical: Superficial cervical adenopathy present. No posterior cervical adenopathy present.       Right: No supraclavicular adenopathy present.       Left: No supraclavicular adenopathy present.  Neurological: She is oriented to person, place, and time. She  appears lethargic.  Skin: Skin is warm and dry. She is not diaphoretic. No erythema.  Psychiatric: She has a normal mood and affect. Her behavior is normal.          Results for orders placed in visit on 06/04/13  POCT RAPID STREP A (OFFICE)      Result Value Range   Rapid Strep A Screen Positive (*) Negative   Assessment & Plan:  Sore throat - Plan: POCT rapid strep A  Streptococcus infection, group A  Meds ordered this encounter  Medications  . amoxicillin (AMOXIL) 500 MG tablet    Sig: Take 2 tablets (1,000 mg total) by mouth 3 (three) times daily.    Dispense:  60 tablet    Refill:  0  . Diphenhyd-Hydrocort-Nystatin (FIRST-DUKES MOUTHWASH) SUSP    Sig: Use as directed 5 mLs in the mouth or throat every 2 (two) hours as needed (sore throat).    Dispense:  237 mL    Refill:  0  . HYDROcodone-acetaminophen (HYCET) 7.5-325 mg/15 ml solution    Sig: Take 10-15 mLs by mouth every 6 (six) hours as needed for pain.    Dispense:  140 mL    Refill:  0

## 2013-06-04 NOTE — Patient Instructions (Addendum)
Angina por estreptococos  (Strep Throat)   La angina estreptocóccica es una infección en la garganta causada por una bacteria llamada Streptococcus pyogenes. El médico la llamará "amigdalitis" o "faringitis" estreptocóccica, según haya signos de inflamación en las amígdalas o en la zona posterior de la garganta. Es más frecuente en niños entre los 5 y los 15 años durante los meses de frío, pero puede ocurrir a personas de cualquier edad. La infección se transmite de persona a persona (contagio) a través de la tos, el estornudo u otro contacto cercano.   SÍNTOMAS  · Fiebre o escalofríos.  · La garganta o las amígdalas le duelen y están inflamadas.  · Dolor o dificultad para tragar.  · Puntos blancos o amarillos en las amígdalas o la garganta.  · Ganglios linfáticos hinchados a los lados del cuello o debajo de la mandíbula.  · Erupción rojiza en todo el cuerpo (poco común).  DIAGNÓSTICO  Diferentes infecciones pueden causar los mismos síntomas. Para confirmar el diagnóstico deberá hacerse análisis, y así podrán prescribirle el tratamiento adecuado. La "prueba rápida de estreptococo" ayudará al médico a hacer el diagnóstico en algunos minutos. Si no se dispone de la prueba, se hará un rápido hisopado de la zona afectada para hacer un cultivo de las secreciones de la garganta. Si se hace un cultivo, los resultados estarán disponibles en uno o dos días.   TRATAMIENTO  El estreptococo de garganta se trata con antibióticos.  INSTRUCCIONES PARA EL CUIDADO DOMICILIARIO  · Haga gárgaras con 1 cucharadita de sal en 1 taza de agua tibia, 3 ó 4 veces por día o cuando lo crea necesario para sentirse mejor.  · Los miembros de la familia que también tengan dolor en la garganta deben ser evaluados y tratados con antibióticos si tienen la infección.  · Asegúrese de que todas las personas de su casa se laven bien las manos.  · No comparta alimentos, tazas ni utensilios personales que puedan contagiar la infección.  · Coma alimentos  blandos hasta que el dolor de garganta mejore.  · Beba gran cantidad de líquido para mantener la orina de tono claro o color amarillo pálido. Esto ayudará a prevenir la deshidratación.  · Debe hacer reposo.  · No concurra a la escuela o la trabajo hasta que haya tomado los antibióticos durante 24 horas.  · Utilice los medicamentos de venta libre o de prescripción para el dolor, el malestar o la fiebre, según se lo indique el profesional que lo asiste.  · Si le han recetado medicamentos, tómelos según las indicaciones. Finalice la prescripción completa, aunque se sienta mejor.  SOLICITE ATENCIÓN MÉDICA SI:  · Los ganglios del cuello siguen agrandados.  · Aparece una erupción cutánea, tos o dolor de oídos.  · Tiene un catarro verde, amarillo amarronado o esputo sanguinolento.  · Siente dolor o malestar que no puede controlar con los medicamentos.  · Los síntomas parecen empeorar en vez de mejorar.  SOLICITE ATENCIÓN MÉDICA DE INMEDIATO SI:  · Presenta algún nuevo síntoma, como vómitos, dolor de cabeza intenso, rigidez o dolor en el cuello, dolor en el pecho o problemas respiratorios o dificultad para tragar.  · Presenta dolor de garganta intenso, babeo o cambios en la voz.  · Siente que el cuello se hincha o la piel de esa zona se vuelve roja y sensible.  · Tiene fiebre.  · Tiene signos de deshidratación, como fatiga, boca seca y disminución de la orina.  · Comienza a sentir mucho sueño, o no   puede despertarse bien.  Document Released: 06/25/2005 Document Revised: 09/01/2012  [employer]® Patient Information ©2014 [employer].

## 2014-04-25 IMAGING — US US OB LIMITED
1 series · 13 of 20 positions shown · non-contrast
Comparison: none

[Series 1: afi · 0.21mm/px · 13 of 20 slices shown]
[im 1/20]
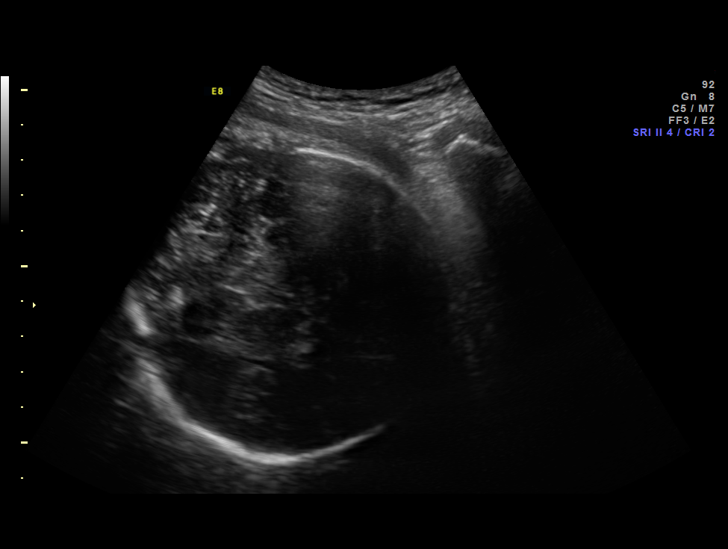
[im 3/20]
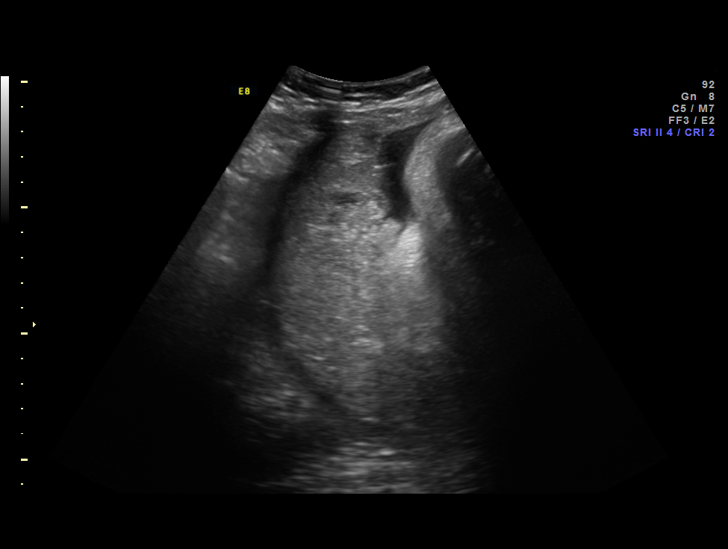
[im 4/20]
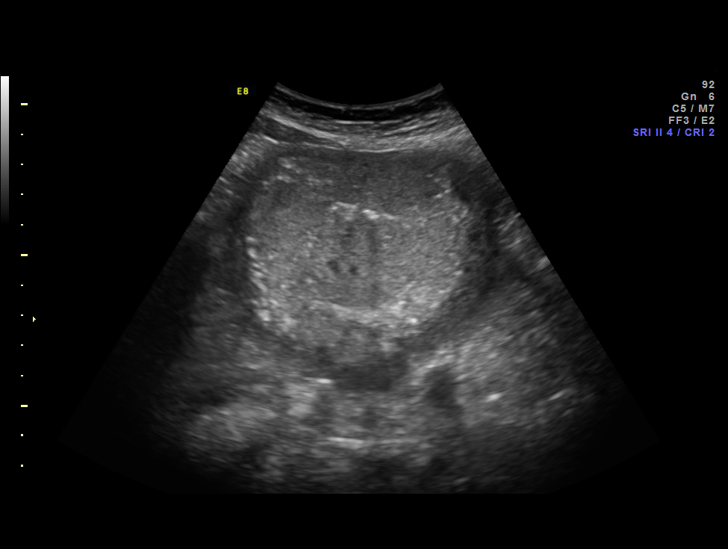
[im 6/20]
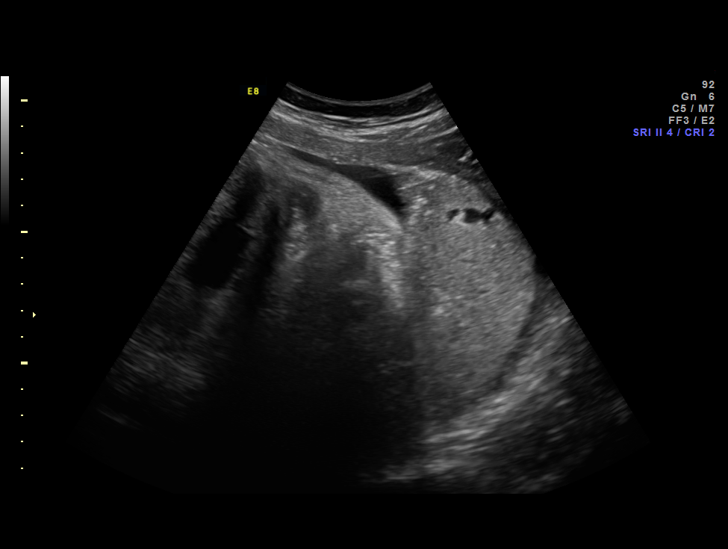
[im 7/20]
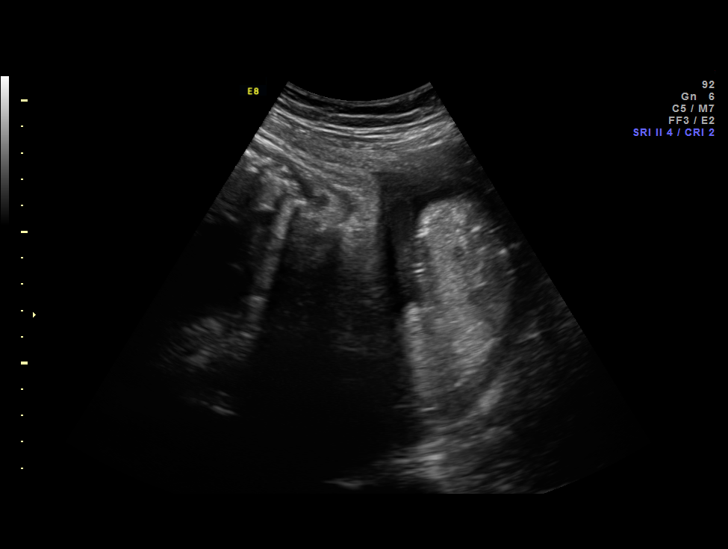
[im 9/20]
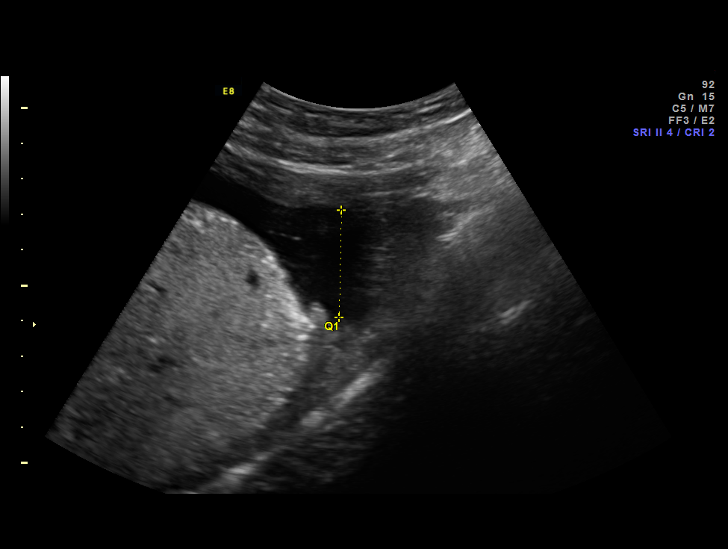
[im 11/20]
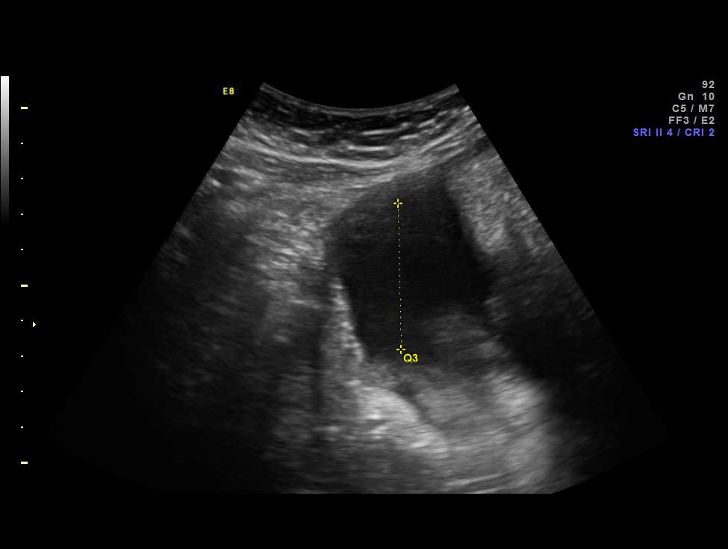
[im 12/20]
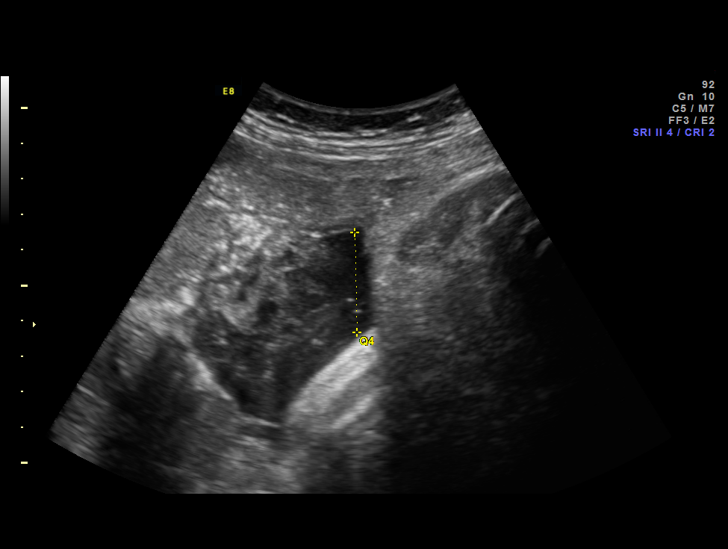
[im 14/20]
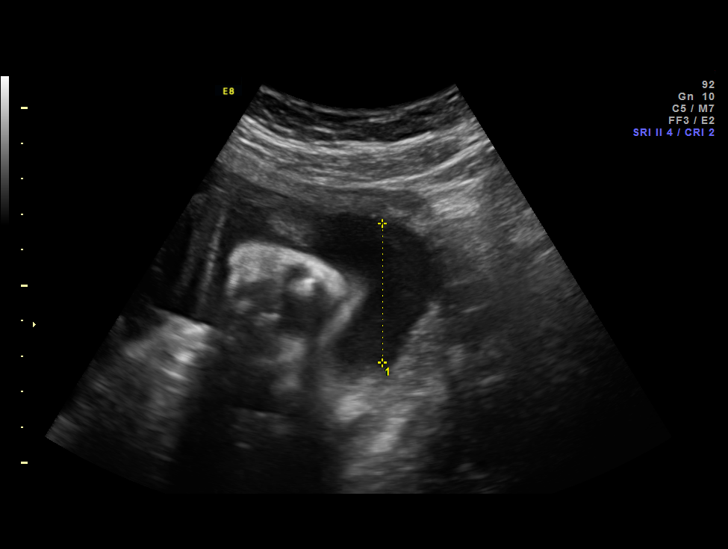
[im 15/20]
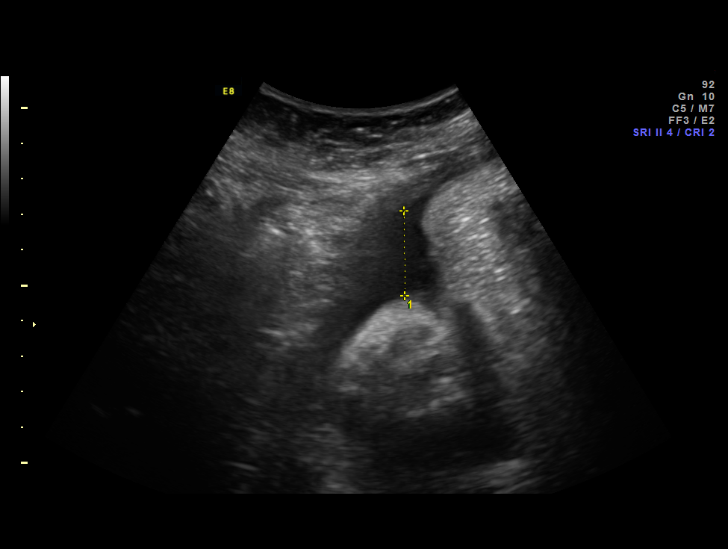
[im 17/20]
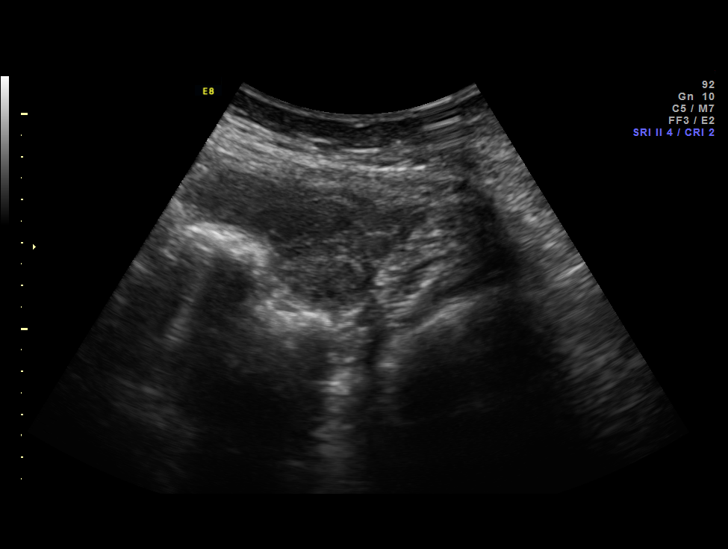
[im 18/20]
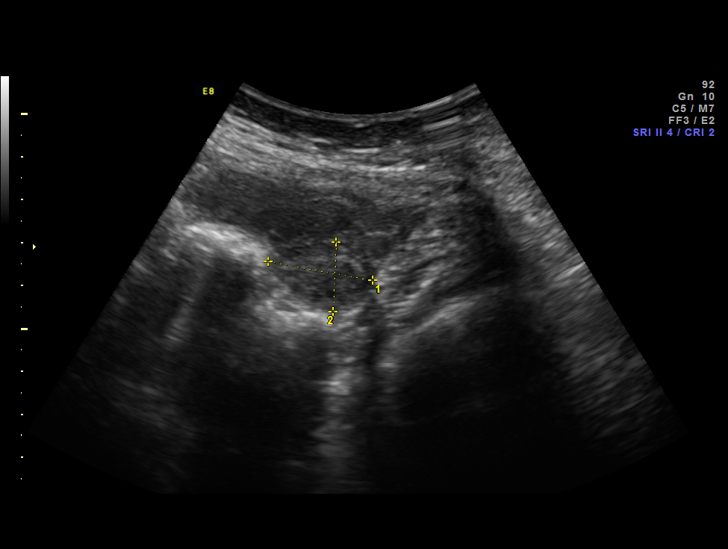
[im 20/20]
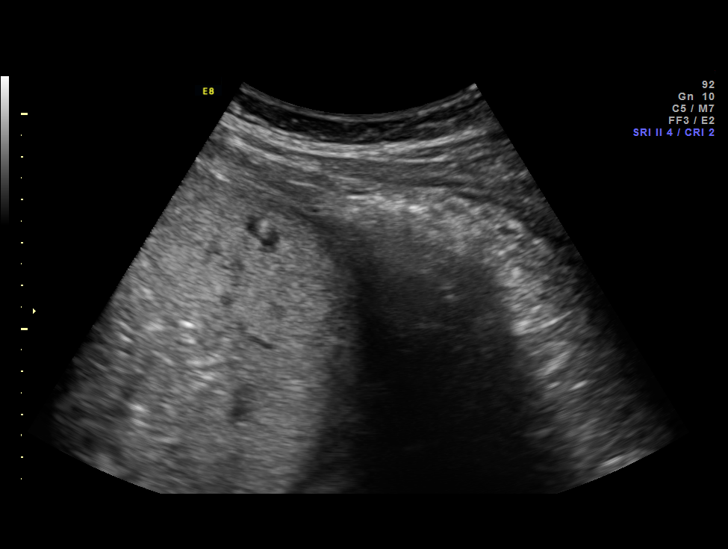

[13 of 20 positions shown; findings below may reference images not displayed]

OBSTETRICS REPORT
                      (Signed Final 06/04/2012 [DATE])

           [facility]

 Order#:         [id number]
Procedures

 [HOSPITAL]                                         [address].0
Indications

 Poor obstetric history: Previous preterm delivery
 Assess amniotic fluid volume
 Diabetes - Gestational, A2 on glyburide
Fetal Evaluation

 Fetal Heart Rate:  152                          bpm
 Cardiac Activity:  Observed
 Presentation:      Cephalic
 Placenta:          Posterior, above cervical
                    os
 P. Cord            Visualized
 Insertion:

 Amniotic Fluid
 AFI FV:      Subjectively within normal limits
 AFI Sum:     13.99   cm       51  %Tile     Larg Pckt:    4.12  cm
 RUQ:   3.03    cm   RLQ:    4.02   cm    LUQ:   4.12    cm   LLQ:    2.82   cm
Gestational Age

 LMP:           36w 0d        Date:  09/26/11                 EDD:   07/02/12
 Best:          36w 0d     Det. By:  LMP  (09/26/11)          EDD:   07/02/12
Cervix Uterus Adnexa

 Cervix:       Not visualized (advanced GA >29 wks)
 Left Ovary:    Not visualized.
 Right Ovary:   Within normal limits.

 Adnexa:     No abnormality visualized.
Impression

 IUP at 36+0 weeks
 Normal amniotic fluid volume
 NST reactive
 BSs reviewed. No change in glyburide dose.
Recommendations

 Continue twice weekly NSTs with weekly AFIs

 [facility] with us.  Please do not hesitate to

## 2015-09-05 ENCOUNTER — Encounter: Payer: Self-pay | Admitting: Obstetrics & Gynecology

## 2015-09-13 ENCOUNTER — Ambulatory Visit (INDEPENDENT_AMBULATORY_CARE_PROVIDER_SITE_OTHER): Payer: Self-pay | Admitting: Obstetrics and Gynecology

## 2015-09-13 ENCOUNTER — Encounter: Payer: Self-pay | Admitting: Obstetrics and Gynecology

## 2015-09-13 VITALS — BP 103/70 | HR 83 | Wt 148.0 lb

## 2015-09-13 DIAGNOSIS — Z8632 Personal history of gestational diabetes: Principal | ICD-10-CM

## 2015-09-13 DIAGNOSIS — Z124 Encounter for screening for malignant neoplasm of cervix: Secondary | ICD-10-CM

## 2015-09-13 DIAGNOSIS — O09291 Supervision of pregnancy with other poor reproductive or obstetric history, first trimester: Secondary | ICD-10-CM

## 2015-09-13 DIAGNOSIS — O0990 Supervision of high risk pregnancy, unspecified, unspecified trimester: Secondary | ICD-10-CM | POA: Insufficient documentation

## 2015-09-13 DIAGNOSIS — Z1151 Encounter for screening for human papillomavirus (HPV): Secondary | ICD-10-CM

## 2015-09-13 DIAGNOSIS — O09219 Supervision of pregnancy with history of pre-term labor, unspecified trimester: Secondary | ICD-10-CM

## 2015-09-13 DIAGNOSIS — Z113 Encounter for screening for infections with a predominantly sexual mode of transmission: Secondary | ICD-10-CM

## 2015-09-13 DIAGNOSIS — O09891 Supervision of other high risk pregnancies, first trimester: Secondary | ICD-10-CM

## 2015-09-13 DIAGNOSIS — O09899 Supervision of other high risk pregnancies, unspecified trimester: Secondary | ICD-10-CM | POA: Insufficient documentation

## 2015-09-13 DIAGNOSIS — O0991 Supervision of high risk pregnancy, unspecified, first trimester: Secondary | ICD-10-CM

## 2015-09-13 DIAGNOSIS — O09211 Supervision of pregnancy with history of pre-term labor, first trimester: Secondary | ICD-10-CM

## 2015-09-13 DIAGNOSIS — O09299 Supervision of pregnancy with other poor reproductive or obstetric history, unspecified trimester: Secondary | ICD-10-CM | POA: Insufficient documentation

## 2015-09-13 MED ORDER — PROMETHAZINE HCL 25 MG PO TABS: 25.0000 mg | ORAL_TABLET | Freq: Four times a day (QID) | ORAL | Status: DC | PRN

## 2015-09-13 NOTE — Patient Instructions (Signed)
Primer trimestre de embarazo (First Trimester of Pregnancy) El primer trimestre de embarazo se extiende desde la semana1 hasta el final de la semana12 (mes1 al mes3). Una semana despus de que un espermatozoide fecunda un vulo, este se implantar en la pared uterina. Este embrin comenzar a desarrollarse hasta convertirse en un beb. Sus genes y los de su pareja forman el beb. Los genes del varn determinan si ser un nio o una nia. Entre la semana6 y la8, se forman los ojos y el rostro, y los latidos del corazn pueden verse en la ecografa. Al final de las 12semanas, todos los rganos del beb estn formados.  Ahora que est embarazada, querr hacer todo lo que est a su alcance para tener un beb sano. Dos de las cosas ms importantes son tener una buena atencin prenatal y seguir las indicaciones del mdico. La atencin prenatal incluye toda la asistencia mdica que usted recibe antes del nacimiento del beb. Esta ayudar a prevenir, detectar y tratar cualquier problema durante el embarazo y el parto. CAMBIOS EN EL ORGANISMO Su organismo atraviesa por muchos cambios durante el embarazo, y estos varan de una mujer a otra.   Al principio, puede aumentar o bajar algunos kilos.  Puede tener malestar estomacal (nuseas) y vomitar. Si no puede controlar los vmitos, llame al mdico.  Puede cansarse con facilidad.  Es posible que tenga dolores de cabeza que pueden aliviarse con los medicamentos que el mdico le permita tomar.  Puede orinar con mayor frecuencia. El dolor al orinar puede significar que usted tiene una infeccin de la vejiga.  Debido al embarazo, puede tener acidez estomacal.  Puede estar estreida, ya que ciertas hormonas enlentecen los movimientos de los msculos que empujan los desechos a travs de los intestinos.  Pueden aparecer hemorroides o abultarse e hincharse las venas (venas varicosas).  Las mamas pueden empezar a agrandarse y estar sensibles. Los pezones  pueden sobresalir ms, y el tejido que los rodea (areola) tornarse ms oscuro.  Las encas pueden sangrar y estar sensibles al cepillado y al hilo dental.  Pueden aparecer zonas oscuras o manchas (cloasma, mscara del embarazo) en el rostro que probablemente se atenuarn despus del nacimiento del beb.  Los perodos menstruales se interrumpirn.  Tal vez no tenga apetito.  Puede sentir un fuerte deseo de consumir ciertos alimentos.  Puede tener cambios a nivel emocional da a da, por ejemplo, por momentos puede estar emocionada por el embarazo y por otros preocuparse porque algo pueda salir mal con el embarazo o el beb.  Tendr sueos ms vvidos y extraos.  Tal vez haya cambios en el cabello que pueden incluir su engrosamiento, crecimiento rpido y cambios en la textura. A algunas mujeres tambin se les cae el cabello durante o despus del embarazo, o tienen el cabello seco o fino. Lo ms probable es que el cabello se le normalice despus del nacimiento del beb. QU DEBE ESPERAR EN LAS CONSULTAS PRENATALES Durante una visita prenatal de rutina:  La pesarn para asegurarse de que usted y el beb estn creciendo normalmente.  Le controlarn la presin arterial.  Le medirn el abdomen para controlar el desarrollo del beb.  Se escucharn los latidos cardacos a partir de la semana10 o la12 de embarazo, aproximadamente.  Se analizarn los resultados de los estudios solicitados en visitas anteriores. El mdico puede preguntarle:  Cmo se siente.  Si siente los movimientos del beb.  Si ha tenido sntomas anormales, como prdida de lquido, sangrado, dolores de cabeza intensos o   médico puede preguntarle:  · Cómo se siente.  · Si siente los movimientos del bebé.  · Si ha tenido síntomas anormales, como pérdida de líquido, sangrado, dolores de cabeza intensos o cólicos abdominales.  · Si está consumiendo algún producto que contenga tabaco, como cigarrillos, tabaco de mascar y cigarrillos electrónicos.  · Si tiene alguna pregunta.  Otros estudios que pueden realizarse durante el primer trimestre incluyen lo siguiente:  · Análisis de sangre para determinar el tipo  de sangre y detectar la presencia de infecciones previas. Además, se los usará para controlar si los niveles de hierro son bajos (anemia) y determinar los anticuerpos Rh. En una etapa más avanzada del embarazo, se harán análisis de sangre para saber si tiene diabetes, junto con otros estudios si surgen problemas.  · Análisis de orina para detectar infecciones, diabetes o proteínas en la orina.  · Una ecografía para confirmar que el bebé crece y se desarrolla correctamente.  · Una amniocentesis para diagnosticar posibles problemas genéticos.  · Estudios del feto para descartar espina bífida y síndrome de [place].  · Es posible que necesite otras pruebas adicionales.  · Prueba del VIH (virus de inmunodeficiencia humana). Los exámenes prenatales de rutina incluyen la prueba de detección del VIH, a menos que decida no realizársela.  INSTRUCCIONES PARA EL CUIDADO EN EL HOGAR   Medicamentos:  · Siga las indicaciones del médico en relación con el uso de medicamentos. Durante el embarazo, hay medicamentos que pueden tomarse y otros que no.  · Tome las vitaminas prenatales como se le indicó.  · Si está estreñida, tome un laxante suave, si el médico lo autoriza.  Dieta  · Consuma alimentos balanceados. Elija alimentos variados, como carne o proteínas de origen vegetal, pescado, leche y productos lácteos descremados, verduras, frutas y panes y cereales integrales. El médico la ayudará a determinar la cantidad de peso que puede aumentar.  · No coma carne cruda ni quesos sin cocinar. Estos elementos contienen bacterias que pueden causar defectos congénitos en el bebé.  · La ingesta diaria de cuatro o cinco comidas pequeñas en lugar de tres comidas abundantes puede ayudar a aliviar las náuseas y los vómitos. Si empieza a tener náuseas, comer algunas galletas saladas puede ser de ayuda. Beber líquidos entre las comidas en lugar de tomarlos durante las comidas también puede ayudar a calmar las náuseas y los vómitos.  · Si está  estreñida, consuma alimentos con alto contenido de fibra, como verduras y frutas frescas, y cereales integrales. Beba suficiente líquido para mantener la orina clara o de color amarillo pálido.  Actividad y ejercicios  · Haga ejercicio solamente como se lo haya indicado el médico. El ejercicio la ayudará a:    Controlar el peso.    Mantenerse en forma.    Estar preparada para el trabajo de parto y el parto.  · Los dolores, los cólicos en la parte baja del abdomen o los calambres en la cintura son un buen indicio de que debe dejar de hacer ejercicios. Consulte al médico antes de seguir haciendo ejercicios normales.  · Intente no estar de pie durante mucho tiempo. Mueva las piernas con frecuencia si debe estar de pie en un lugar durante mucho tiempo.  · Evite levantar pesos excesivos.  · Use zapatos de tacones bajos y mantenga una buena postura.  · Puede seguir teniendo relaciones sexuales, excepto que el médico le indique lo contrario.  Alivio del dolor o las molestias  · Use un sostén que le brinde buen   3 o 4veces por da. Limite la cantidad de sal en su dieta. Cuidados prenatales  Programe las visitas prenatales para la semana12 de [place]. Generalmente se programan cada mes al principio y se hacen ms frecuentes en los 2 ltimos meses antes del parto.  Escriba sus preguntas. Llvelas cuando concurra a las visitas prenatales.  Concurra a todas las visitas prenatales como se lo haya indicado el mdico. Seguridad  Colquese el cinturn de seguridad cuando conduzca.  Haga una lista de los nmeros de telfono de  Associate Professor, que [employer] nmeros de telfono de familiares, [place], el hospital y los departamentos de polica y bomberos. Consejos generales  Pdale al mdico que la derive a clases de educacin prenatal en su localidad. Debe comenzar a tomar las clases antes de Cytogeneticist en el mes6 de embarazo.  Pida ayuda si tiene necesidades nutricionales o de asesoramiento Academic librarian. El mdico puede aconsejarla o derivarla a especialistas para que la ayuden con diferentes necesidades.  No se d baos de inmersin en agua caliente, baos turcos ni saunas.  No se haga duchas vaginales ni use tampones o toallas higinicas perfumadas.  No mantenga las piernas cruzadas durante [place].  Evite el contacto con las bandejas sanitarias de los gatos y la tierra que estos animales usan. Estos elementos contienen bacterias que pueden causar defectos congnitos al beb y la posible prdida del feto debido a un aborto espontneo o muerte fetal.  No fume, no consuma hierbas ni medicamentos que no hayan sido recetados por el mdico. Las sustancias qumicas que estos productos contienen afectan la formacin y el desarrollo del beb.  No consuma ningn producto que contenga tabaco, lo que incluye cigarrillos, tabaco de Theatre manager y Administrator, Civil Service. Si necesita ayuda para dejar de fumar, consulte al [employer]. Puede recibir asesoramiento y otro tipo de recursos para dejar de fumar.  Programe una cita con el dentista. En su casa, lvese los dientes con un cepillo dental blando y psese el hilo dental con suavidad. SOLICITE ATENCIN MDICA SI:   Tiene mareos.  Siente clicos leves, presin en la pelvis o dolor persistente en el abdomen.  Tiene nuseas, vmitos o diarrea persistentes.  Tiene secrecin vaginal con mal olor.  Siente dolor al [employer].  Tiene el rostro, las [place], las piernas o los tobillos ms hinchados. SOLICITE ATENCIN MDICA DE INMEDIATO SI:   Tiene fiebre.  Tiene una prdida de  lquido por la vagina.  Tiene sangrado o pequeas prdidas vaginales.  Siente dolor intenso o clicos en el abdomen.  Sube o baja de peso rpidamente.  Vomita sangre de color rojo brillante o material que parezca granos de caf.  Ha estado expuesta a la rubola y no ha sufrido la enfermedad.  Ha estado expuesta a la quinta enfermedad o a la varicela.  Tiene un dolor de cabeza intenso.  Le falta el aire.  Sufre cualquier tipo de traumatismo, por ejemplo, debido a una cada o un accidente automovilstico.   Esta informacin no tiene Theme park manager el consejo del mdico. Asegrese de hacerle al mdico cualquier pregunta que tenga.   Document Released: 06/25/2005 Document Revised: 10/06/2014 Elsevier Interactive Patient Education 2016 [employer].  Diabetes mellitus gestacional (Gestational Diabetes Mellitus) La diabetes mellitus gestacional, ms comnmente conocida como diabetes gestacional es un tipo de diabetes que desarrollan algunas mujeres durante el [place]. En la diabetes gestacional, el pncreas no produce suficiente insulina (una hormona) o las clulas son menos sensibles a la insulina producida (resistencia a la insulina), o  ambas cosas. Normalmente, la [employer] azcares de los alimentos a las clulas de los tejidos. Las clulas de los tejidos [employer] azcares para Psychiatrist. La falta de insulina o la falta de una respuesta normal a la insulina hace que el exceso de azcar se acumule en la sangre en lugar de Customer service manager en las clulas de los tejidos. Como resultado, se producen niveles altos de Banker (hiperglucemia). El [address] de los niveles altos de International aid/development worker (glucosa) puede causar muchos problemas.  FACTORES DE RIESGO Usted tiene mayor probabilidad de desarrollar diabetes gestacional si tiene antecedentes familiares de diabetes y tambin si tiene uno o ms de los siguientes factores de riesgo:  ndice de masa corporal superior a 30  (obesidad).  Embarazo previo con diabetes gestacional.  La edad avanzada en el momento del embarazo. Si se mantienen los niveles de glucosa en la sangre en un rango normal durante el [place], las mujeres pueden tener un embarazo saludable. Si los niveles de glucosa en la sangre no estn bien controlados, puede haber riesgos para usted, el feto o el recin nacido, o durante el [place] de parto y [place].  SNTOMAS  Si se presentan sntomas, stos son similares a los sntomas que normalmente experimentar durante el embarazo. Los sntomas de la diabetes gestacional son:   Wyvonnia Dusky de la sed (polidipsia).  Aumento de la miccin (poliuria).  Orina con ms frecuencia durante la noche (nocturia).  Prdida de peso. La prdida de peso puede ser muy rpida.  Infecciones frecuentes y recurrentes.  Cansancio (fatiga).  Debilidad.  Cambios en la visin, como visin borrosa.  Olor a Water quality scientist.  Dolor abdominal. DIAGNSTICO La diabetes se diagnostica cuando hay aumento de los niveles de glucosa en la [place]. El nivel de glucosa en la sangre puede controlarse en uno o ms de los siguientes anlisis de sangre:  Medicin de glucosa en la sangre en [place]. No se le permitir comer durante al menos 8 horas antes de que se tome [place] de [place].  Pruebas al azar de glucosa en la sangre. El nivel de glucosa en la sangre se controla en cualquier momento del da sin importar el momento en que haya comido.  Prueba de tolerancia a la glucosa oral (PTGO). La glucosa en la sangre se mide despus de no haber comido (ayunas) durante una a tres horas y despus de beber una bebida que contenga glucosa. Dado que las hormonas que causan la resistencia a la insulina son ms altas alrededor [employer] 24 a 28 de Psychiatrist, generalmente se realiza una PTGO durante ese tiempo. Si tiene factores de riesgo, en la primera visita prenatal pueden hacerle pruebas de deteccin de diabetes tipo 2 no  diagnosticada. TRATAMIENTO  La diabetes gestacional debe controlarse en primer lugar con dieta y ejercicios. Pueden agregarse medicamentos, pero solo si son necesarios.  Usted tendr que tomar medicamentos para la diabetes o insulina diariamente para Pharmacologist los niveles de glucosa en la sangre en el rango deseado.  Usted tendr [employer] dosis de insulina con la actividad fsica y la eleccin de alimentos saludables. Si tiene diabetes gestacional, el objetivo del tratamiento ser [employer] siguientes niveles sanguneos de glucosa:  Antes de las comidas (preprandial): valor de 95 mg/dl o inferior.  Despus de las comidas (posprandial):  Una hora despus de la comida: valor de 140 mg/dl o inferior.  Dos horas despus de la comida: valor de 120 mg/dl o inferior. Si tiene diabetes  tipo 1 o tipo 2 preexistente, el objetivo del tratamiento ser [employer] siguientes niveles sanguneos de glucosa:  Antes de las comidas, a la hora de acostarse y durante la noche: de 60 a 99 mg/dl.  Despus de las comidas: valor mximo de 100 a 129 mg/dl. INSTRUCCIONES PARA EL CUIDADO EN EL HOGAR   Controle su nivel de hemoglobina A1c dos veces al ao.  Contrlese a diario el nivel de glucosa en la sangre segn las indicaciones de su mdico. Es comn Education officer, environmental controles frecuentes de la glucosa en la [place].  Supervise las cetonas en la orina cuando est enferma y segn las indicaciones de su mdico.  Tome el medicamento para la diabetes y adminstrese insulina segn las indicaciones de su mdico para Radio producer nivel de glucosa en la sangre en el rango deseado.  Nunca se quede sin medicamento para la diabetes o sin insulina. Es necesario que la reciba [employer].  Ajuste la insulina segn la ingesta de hidratos de carbono. Los hidratos de carbono pueden aumentar los niveles de glucosa en la sangre, pero deben incluirse en su dieta. Los hidratos de carbono aportan vitaminas, minerales y [place]  que son Neomia Dear parte esencial de una dieta saludable. Los hidratos de carbono se encuentran en frutas, verduras, cereales integrales, productos lcteos, legumbres y alimentos que contienen azcares aadidos.  Consuma alimentos saludables. Alterne 3 comidas con 3 colaciones.  Aumente de peso saludablemente. El aumento del peso total vara de acuerdo con el ndice de masa corporal que tena antes del embarazo [facility]).  Lleve una tarjeta de alerta mdica o use una pulsera o medalla de alerta mdica.  Lleve con usted una colacin de 15gramos de hidratos de carbono en todo momento para controlar los niveles bajos de glucosa en la sangre (hipoglucemia). Algunos ejemplos de colaciones de 15gramos de hidratos de carbono son los siguientes:  Tabletas de glucosa, 3 o 4.  Gel de glucosa, tubo de 15 gramos.  Pasas de uva, 2 cucharadas (24 g).  Caramelos de goma, 6.  Galletas de [place], 8.  Jugo de fruta, gaseosa comn, o [place], 4 onzas (120 ml).  Pastillas de goma, 9.  Reconocer la hipoglucemia. Durante el embarazo la hipoglucemia se produce cuando hay niveles de glucosa en la sangre de 60 mg/dl o menos. El riesgo de hipoglucemia aumenta durante el ayuno o cuando se saltea las comidas, durante o despus de Education officer, environmental ejercicio intenso y [place] duerme. Los sntomas de hipoglucemia son:  Temblores o sacudidas.  Disminucin de la capacidad de concentracin.  Sudoracin.  Aumento de la frecuencia cardaca.  Dolor de [place].  Sequedad en la boca.  Hambre.  Irritabilidad.  Ansiedad.  Sueo agitado.  Alteracin del habla o de la coordinacin.  Confusin.  Tratar la hipoglucemia rpidamente. Si usted est alerta y puede tragar con seguridad, siga la regla de 15/15 que consiste en:  [place] 15 y 20gramos de glucosa de accin rpida o carbohidratos. Las opciones de accin rpida son un gel de glucosa, tabletas de glucosa, o 4 onzas (120 ml) de jugo de frutas, gaseosa  comn, o leche baja en grasa.  Compruebe su nivel de glucosa en la sangre 15 minutos despus de tomar la glucosa.  Tome entre 15 y 20 gramos ms de glucosa si el nivel de glucosa en la sangre todava es de 70mg /dl o inferior.  Ingiera una comida o una colacin en el lapso de 1 hora una vez que los niveles de glucosa en la [place]  vuelven a la normalidad.  Est atento a la poliuria (miccin excesiva) y la polidipsia (sensacin de mucha sed), que son los primeros signos de la hiperglucemia. El reconocimiento temprano de la hiperglucemia permite un tratamiento oportuno. Trate la hiperglucemia segn le indic su mdico.  Haga actividad fsica por lo menos al da o como lo indique su mdico. Se recomienda que 30 minutos despus de cada comida, realice diez minutos de actividad fsica para controlar los niveles de glucosa postprandial en la [place].  Ajuste su dosis de insulina y la ingesta de alimentos, segn sea necesario, si inicia un nuevo ejercicio o deporte.  Siga su plan para los [address] de enfermedad cuando no pueda comer o beber como de [place].  Evite el tabaco y el alcohol.  Concurra a todas las visitas de control como se lo haya indicado el mdico.  Siga el consejo del mdico respecto a los controles prenatales y posteriores al parto (postparto), las visitas, la planificacin de las comidas, el ejercicio, los medicamentos, las vitaminas, los anlisis de [place], otras pruebas mdicas y [place] fsicas.  Realice diariamente el cuidado de la piel y de los pies. Examine su piel y los pies diariamente para ver si tiene cortes, moretones, enrojecimiento, problemas en las uas, sangrado, ampollas o Advertising account planner.  Cepllese los dientes y encas por lo menos dos veces al da y use hilo dental al menos una vez por da. Concurra regularmente a las visitas de control con el dentista.  Programe un examen de vista durante el primer trimestre de su embarazo o como lo indique su  mdico.  Comparta su plan de control de diabetes en el trabajo o en la escuela.  Mantngase al da con las vacunas.  Aprenda a Dealer.  Obtenga la mayor cantidad posible de informacin sobre la diabetes y solicite ayuda siempre que sea necesario.  Obtenga informacin sobre el amamantamiento y analice esta posibilidad.  Debe controlar el nivel de azcar en la sangre de 6a 12semanas despus del parto. Esto se hace con una prueba de tolerancia a la glucosa oral (PTGO). SOLICITE ATENCIN MDICA SI:   No puede comer alimentos o beber por ms de 6 horas.  Tuvo nuseas o ha vomitado durante ms de 6 horas.  Tiene un nivel de glucosa en la sangre de 200 mg/dl y cetonas en la orina.  Presenta algn cambio en el estado mental.  Desarrolla problemas de visin.  Sufre un dolor persistente de Training and development officer.  Siente dolor o molestias en la parte superior del abdomen.  Desarrolla una enfermedad grave adicional.  Tuvo diarrea durante ms de 6 horas.  Ha estado enfermo o ha tenido fiebre durante un par [address] y no mejora. SOLICITE ATENCIN MDICA DE INMEDIATO SI:   Tiene dificultad para respirar.  Ya no siente los movimientos del beb.  Est sangrando o tiene flujo vaginal.  Comienza a tener contracciones o trabajo de [place] prematuro. ASEGRESE DE QUE:  Comprende estas instrucciones.  Controlar su afeccin.  Recibir ayuda de inmediato si no mejora o si empeora.   Esta informacin no tiene Theme park manager el consejo del mdico. Asegrese de hacerle al mdico cualquier pregunta que tenga.   Document Released: 06/25/2005 Document Revised: 10/06/2014 Elsevier Interactive Patient Education 2016 [employer].  [employer] del mtodo anticonceptivo (Contraception Choices) La anticoncepcin (control de la natalidad) es el uso de cualquier mtodo o dispositivo para Location manager. A continuacin se indican algunos de esos mtodos. MTODOS HORMONALES   El Implante  contraconceptivo consiste en un tubo plstico delgado que contiene la hormona progesterona. No contiene estrgenos. El mdico inserta el tubo en la parte interna del brazo. El tubo puede Geneticist, molecular durante 3 aos. Despus de los 3 aos debe retirarse. El implante impide que los ovarios liberen vulos (ovulacin), espesa el moco cervical, lo que evita que los espermatozoides ingresen al tero y hace ms delgada la membrana que cubre el interior del tero.  Inyecciones de progesterona sola: las Insurance underwriter cada 3 meses para Location manager. La progesterona sinttica impide que los ovarios liberen vulos. Tambin hacen que el moco cervical se espese y modifique el tejido de recubrimiento interno del tero. Esto hace ms difcil que los espermatozoides sobrevivan en el tero.  Las pldoras anticonceptivas contienen estrgenos y Education officer, museum. Su funcin es [employer] ovarios liberen vulos (ovulacin). Las hormonas de los anticonceptivos orales hacen que el moco cervical se haga ms espeso, lo que evita que el esperma ingrese al tero. Las pldoras anticonceptivas son recetadas por el mdico.Tambin se utilizan para tratar los perodos menstruales abundantes.  Minipldora: este tipo de pldora anticonceptiva contiene slo hormona progesterona. Deben tomarse todos los [address] del mes y debe recetarlas el mdico.  El parche de control de natalidad: contiene hormonas similares a las que contienen las pldoras anticonceptivas. Deben cambiarse una vez por semana y se utilizan bajo prescripcin mdica.  Anillo vaginal: contiene hormonas similares a las que contienen las pldoras anticonceptivas. Se deja colocado durante tres semanas, se lo retira durante 1 semana y luego se coloca uno nuevo. La paciente debe sentirse cmoda al insertar y retirar el anillo de la vagina.Es necesaria la prescripcin mdica.  Anticonceptivos de emergencia: son mtodos para evitar un embarazo despus de Neomia Dear  relacin sexual sin proteccin. Esta pldora puede tomarse inmediatamente despus de Child psychotherapist sexuales o hasta 5 [place] de haber tenido sexo sin proteccin. Es ms efectiva si se toma poco tiempo despus de la relacin sexual. Los anticonceptivos de emergencia estn disponibles sin prescripcin mdica. Consltelo con su farmacutico. No use los anticonceptivos de emergencia como nico mtodo anticonceptivo. MTODOS DE Lenis Noon   Condn masculino: es una vaina delgada (ltex o goma) que se coloca cubriendo al pene durante el acto sexual. Deri Fuelling con espermicida para aumentar la efectividad.  Condn femenino. Es una funda delicada y blanda que se adapta holgadamente a la vagina antes de las Clinical research associate.  Diafragma: es una barrera de ltex redonda y suave que debe ser recomendado por un profesional. Se inserta en la vagina, junto con un gel espermicida. Debe insertarse antes de Management consultant. Debe dejar el diafragma colocado en la vagina durante 6 a 8 horas despus de la relacin sexual.  Capuchn cervical: es una barrera de ltex o taza plstica redonda y [place] que cubre el cuello del tero y debe ser colocada por un mdico. Puede dejarlo colocado en la vagina hasta 48 horas despus de las Clinical research associate.  Esponja: es una pieza blanda y circular de espuma de poliuretano. Contiene un espermicida. Se inserta en la vagina despus de mojarla y antes de las [employer].  Espermicidas: son sustancias qumicas que matan o bloquean al esperma y no lo dejan ingresar al cuello del tero y al tero. Vienen en forma de cremas, geles, supositorios, espuma o comprimidos. No es necesario tener Emergency planning/management officer. Se insertan en la vagina con un aplicador antes de Management consultant. El proceso debe repetirse cada vez que tiene relaciones sexuales.  ANTICONCEPTIVOS INTRAUTERINOS  Dispositivo intrauterino (DIU) es un dispositivo en forma de T que se coloca en el tero durante  el perodo menstrual, para Location manager. Hay dos tipos:  DIU de cobre: este tipo de DIU est recubierto con un alambre de cobre y se inserta dentro del tero. El cobre hace que el tero y las trompas de Falopio produzcan un liquido que [employer] espermatozoides. Puede permanecer colocado durante 10 aos.  DIU con hormona: este tipo de DIU contiene la hormona progestina (progesterona sinttica). La hormona espesa el moco cervical y evita que los espermatozoides ingresen al tero y tambin afina la membrana que cubre el tero para evitar la implantacin del vulo fertilizado. La hormona debilita o destruye los espermatozoides que ingresan al tero. Puede Geneticist, molecular durante 3-5 aos, segn el tipo de DIU que se [place]. MTODOS ANTICONCEPTIVOS PERMANENTES  Ligadura de trompas en la mujer: se realiza sellando, atando u obstruyendo quirrgicamente las trompas de Falopio lo que impide que el vulo descienda hacia el tero.  Esterilizacin histeroscpica: Implica la colocacin de un pequeo espiral o la insercin en cada trompa de Falopio. El mdico utiliza una tcnica llamada histeroscopa para Primary school teacher procedimiento. El dispositivo produce la formacin de tejido Designer, television/film set. Esto da como resultado una obstruccin permanente de las trompas de Falopio, de modo que la esperma no pueda fertilizar el vulo. Demora alrededor de 3 meses despus del procedimiento hasta que el conducto se obstruye. Tendr que usar otro mtodo anticonceptivo durante al menos 3 meses.  Esterilizacin masculina: se realiza ligando los conductos por los que pasan los espermatozoides (vasectoma).Esto impide que el esperma ingrese a la vagina durante el acto sexual. Luego del procedimiento, el hombre puede eyacular lquido (semen). MTODOS DE PLANIFICACIN NATURAL  Planificacin familiar natural: consiste en no Management consultant o usar un mtodo de barrera (condn, [place], capuchn cervical) en los  [employer] la mujer podra quedar [place].  Mtodo de calendario: consiste en el seguimiento de la duracin de cada ciclo menstrual y la identificacin de los perodos frtiles.  Mtodo de ovulacin: Paramedic las relaciones sexuales durante la ovulacin.  Mtodo sintotrmico: Advertising copywriter sexuales en la poca en la que se est ovulando, utilizando un termmetro y tendiendo en cuenta los sntomas de la ovulacin.  Mtodo postovulacin: Youth worker las relaciones sexuales para despus de haber ovulado. Independientemente del tipo o mtodo anticonceptivo que usted elija, es importante que use condones para protegerse contra las infecciones de transmisin sexual (ETS). Hable con su mdico con respecto a qu mtodo anticonceptivo es el ms apropiado para usted.   Esta informacin no tiene Theme park manager el consejo del mdico. Asegrese de hacerle al mdico cualquier pregunta que tenga.   Document Released: 09/15/2005 Document Revised: 05/18/2013 Elsevier Interactive Patient Education 2016 [employer].  [place] materna (Breastfeeding) Decidir Museum/gallery exhibitions officer es una de las mejores elecciones que puede hacer por usted y su beb. El cambio hormonal durante el Psychiatrist produce el desarrollo del tejido mamario y [place] la cantidad y el tamao de los conductos galactforos. Estas hormonas tambin permiten que las protenas, los azcares y las grasas de la sangre produzcan la [employer] materna en las glndulas productoras de [place]. Las hormonas impiden que la leche materna sea liberada antes del nacimiento del beb, adems de impulsar el flujo de leche luego del nacimiento. Una vez que ha comenzado a Museum/gallery exhibitions officer, Conservation officer, nature beb, as como la succin o el llanto, pueden estimular la  liberacin de [employer] glndulas productoras de [place].  LOS BENEFICIOS DE AMAMANTAR Para el beb  La primera leche (calostro) ayuda a Careers information officer funcionamiento del sistema digestivo  del beb.  La leche tiene anticuerpos que ayudan a Radio producer las infecciones en el beb.  El beb tiene una menor incidencia de asma, alergias y del sndrome de muerte sbita del lactante.  Los nutrientes en la [place] materna son mejores para el beb que la [place] maternizada y estn preparados exclusivamente para cubrir las necesidades del beb.  La leche materna mejora el desarrollo cerebral del beb.  Es menos probable que el beb desarrolle otras enfermedades, como obesidad infantil, asma o diabetes mellitus de tipo 2. Para usted   La lactancia materna favorece el desarrollo de un vnculo muy especial entre la madre y el beb.  Es conveniente. La leche materna siempre est disponible a la Human resources officer y es [place].  La lactancia materna ayuda a quemar caloras y a perder el peso ganado durante el [place].  Favorece la contraccin del tero al tamao que tena antes del embarazo de manera ms rpida y disminuye el sangrado (loquios) despus del parto.  La lactancia materna contribuye a reducir Nurse, adult de desarrollar diabetes mellitus de tipo 2, osteoporosis o cncer de mama o de ovario en el futuro. SIGNOS DE QUE EL BEB EST HAMBRIENTO Primeros signos de [address] de [place].  Se estira.  Mueve la cabeza de un lado a otro.  Mueve la cabeza y abre la boca cuando se le toca la mejilla o la comisura de la boca (reflejo de bsqueda).  Aumenta las vocalizaciones, tales como sonidos de succin, se relame los labios, emite arrullos, suspiros, o chirridos.  Mueve la [employer] boca.  Se chupa con ganas los dedos o las manos. Signos tardos de [employer].  Llora de manera intermitente. Signos de [employer] Corporation signos de hambre extrema requerirn que lo calme y lo consuele antes de que el beb pueda alimentarse adecuadamente. No espere a que se manifiesten los siguientes signos de hambre extrema para comenzar a Museum/gallery exhibitions officer:    Designer, jewellery.  Llanto intenso y fuerte.  Gritos. INFORMACIN BSICA SOBRE LA LACTANCIA MATERNA Iniciacin de la lactancia materna  Encuentre un lugar cmodo para sentarse o acostarse, con un buen respaldo para el cuello y la espalda.  Coloque una almohada o una manta enrollada debajo del beb para acomodarlo a la altura de la mama (si est sentada). Las almohadas para Museum/gallery exhibitions officer se han diseado especialmente a fin de servir de apoyo para los brazos y el beb [employer].  Asegrese de que el abdomen del beb est frente al suyo.   Masajee suavemente la mama. Con las yemas de los dedos, masajee la pared del pecho hacia el pezn en un movimiento circular. Esto estimula el flujo de [place]. Es posible que Engineer, manufacturing systems este movimiento mientras amamanta si la leche fluye lentamente.  Sostenga la mama con el pulgar por arriba del pezn y los otros 4 dedos por debajo de la mama. Asegrese de que los dedos se encuentren lejos del pezn y de la boca del beb.  Empuje suavemente los labios del beb con el pezn o con el dedo.  Cuando la boca del beb se abra lo suficiente, acrquelo rpidamente a la mama e introduzca todo el pezn y la zona oscura que lo rodea (areola), tanto como sea posible, dentro de la boca del beb.  Debe  haber ms areola visible por arriba del labio superior del beb que por debajo del labio inferior.  La lengua del beb debe estar entre la enca inferior y la [place].  Asegrese de que la boca del beb est en la posicin correcta alrededor del pezn (prendida). Los labios del beb deben crear un sello sobre la mama y estar doblados hacia afuera (invertidos).  Es comn que el beb succione durante 2 a 3 minutos para que comience el flujo de [place]. Cmo debe prenderse Es muy importante que le ensee al beb cmo prenderse adecuadamente a la mama. Si el beb no se prende adecuadamente, puede causarle dolor en el pezn y reducir la produccin de [place], y  hacer que el beb tenga un escaso aumento de [place]. Adems, si el beb no se prende adecuadamente al pezn, puede tragar aire durante la alimentacin. Esto puede causarle molestias al beb. Hacer eructar al beb al [place] de mama puede ayudarlo a liberar el aire. Sin embargo, ensearle al beb cmo prenderse a la mama adecuadamente es la mejor manera de evitar que se sienta molesto por tragar Oceanographer se alimenta. Signos de que el beb se ha prendido adecuadamente al pezn:   Payton Doughty o succiona de modo silencioso, sin causarle dolor.  Se escucha que traga cada 3 o 4 succiones.  Hay movimientos musculares por arriba y por delante de sus odos al Printmaker. Signos de que el beb no se ha prendido Audiological scientist al pezn:   Hace ruidos de succin o de chasquido mientras se alimenta.  Siente dolor en el pezn. Si cree que el beb no se prendi correctamente, deslice el dedo en la comisura de la boca y [employer] las encas del beb para interrumpir la succin. Intente comenzar a amamantar nuevamente. Signos de Fish farm manager Signos del beb:   Disminuye gradualmente el nmero de succiones o cesa la succin por completo.  Se duerme.  Relaja el cuerpo.  Retiene una pequea cantidad de [facility] boca.  Se desprende solo del pecho. Signos que presenta usted:  Las mamas han aumentado la firmeza, el peso y el tamao 1 a 3 horas despus de Museum/gallery exhibitions officer.  Estn ms blandas inmediatamente despus de amamantar.  Un aumento del volumen de [place], y tambin un cambio en su consistencia y color se producen hacia el quinto da de Tour manager.  Los pezones no duelen, ni estn agrietados ni sangran. Signos de que su beb recibe la cantidad de leche suficiente  Moja al menos 3 paales en 24 horas. La orina debe ser clara y de color amarillo plido a los [address] de [place].  Defeca al menos 3 veces en 24 horas a los [address]  [address] de [address] La materia fecal debe ser blanda y  [place].  Defeca al menos 3 veces en 24 horas a los [address] de [address] La materia fecal debe ser grumosa y [place].  No registra una prdida de peso mayor del 10% del peso al nacer durante los primeros 3 [address] de [place].  Aumenta de peso un promedio de 4 a 7onzas (113 a 198g) por semana despus de los [address] de vida.  Aumenta de [place], [place], de [place] uniforme a Glass blower/designer de los [address] de vida, sin Passenger transport manager prdida de peso despus de las 2semanas de vida. Despus de alimentarse, es posible que el beb regurgite una pequea cantidad. Esto es frecuente. FRECUENCIA Y DURACIN DE LA LACTANCIA MATERNA El amamantamiento frecuente la ayudar a producir  ms [place]leche y a Education officer, communityprevenir problemas de Engineer, miningdolor en los pezones e hinchazn en las [place] Parkmamas. Alimente al beb cuando muestre signos de hambre o si siente la necesidad de reducir la congestin de las McNarymamas. Esto se denomina "lactancia a demanda". Evite el uso del chupete mientras trabaja para establecer la lactancia (las primeras 4 a 6 semanas despus del nacimiento del beb). Despus de este perodo, podr ofrecerle un chupete. Las investigaciones demostraron que el uso del chupete durante el primer ao de vida del beb disminuye el riesgo de desarrollar el sndrome de muerte sbita del lactante (SMSL). Permita que el nio se alimente en cada mama todo lo que desee. Contine amamantando al beb hasta que haya terminado de alimentarse. Cuando el beb se desprende o se queda dormido mientras se est alimentando de la primera mama, ofrzcale la segunda. Debido a que, con frecuencia, los recin Sunoconacidos permanecen somnolientos las primeras semanas de vida, es posible que deba despertar al beb para alimentarlo. Los horarios de Acupuncturistlactancia varan de un beb a otro. Sin embargo, las siguientes reglas pueden servir como gua para ayudarla a Lawyergarantizar que el beb se alimenta adecuadamente:  Se puede amamantar a los recin nacidos (bebs de 4 semanas o menos de vida)  cada 1 a 3 horas.  No deben transcurrir ms de 3 horas durante el da o 5 horas durante la noche sin que se amamante a los recin nacidos.  Debe amamantar al beb 8 veces como mnimo en un perodo de 24 horas, hasta que comience a introducir slidos en su dieta, a los 6 meses de vida aproximadamente. EXTRACCIN DE Dean Foods CompanyLECHE MATERNA La extraccin y Contractorel almacenamiento de la leche materna le permiten asegurarse de que el beb se alimente exclusivamente de [place] Grand Forksleche materna, aun en momentos en los que no puede amamantar. Esto tiene especial importancia si debe regresar al Aleen Campitrabajo en el perodo en que an est amamantando o si no puede estar presente en los momentos en que el beb debe alimentarse. Su asesor en lactancia puede orientarla sobre cunto tiempo es seguro almacenar [place]leche materna.  El sacaleche es un aparato que le permite extraer leche de la mama a un recipiente estril. Luego, la leche materna extrada puede almacenarse en un refrigerador o Electrical engineercongelador. Algunos sacaleches son Birdie Riddlemanuales, Delaney Meigsmientras que otros son elctricos. Consulte a su asesor en lactancia qu tipo ser ms conveniente para usted. Los sacaleches se pueden comprar; sin embargo, algunos hospitales y grupos de apoyo a la lactancia materna alquilan Sports coachsacaleches mensualmente. Un asesor en lactancia puede ensearle cmo extraer W. R. Berkleyleche materna manualmente, en caso de que prefiera no usar un sacaleche.  CMO CUIDAR LAS MAMAS DURANTE LA LACTANCIA MATERNA Los pezones se secan, agrietan y duelen durante la Tour managerlactancia materna. Las siguientes recomendaciones pueden ayudarla a Pharmacologistmantener las TEPPCO Partnersmamas humectadas y sanas:  Careers information officervite usar jabn en los pezones.  Use un sostn de soporte. Aunque no son esenciales, las camisetas sin mangas o los sostenes especiales para Museum/gallery exhibitions officeramamantar estn diseados para acceder fcilmente a las mamas, para Museum/gallery exhibitions officeramamantar sin tener que quitarse todo el sostn o la camiseta. Evite usar sostenes con aro o sostenes muy ajustados.  Seque al aire sus  pezones durante 3 a 4minutos despus de amamantar al beb.  Utilice solo apsitos de Haematologistalgodn en el sostn para Environmental health practitionerabsorber las prdidas de Holcombleche. La prdida de un poco de Public Service Enterprise Groupleche materna entre las tomas es normal.  Utilice lanolina sobre los pezones luego de Museum/gallery exhibitions officeramamantar. La lanolina ayuda a mantener la humedad normal de la piel.  Si [place] lanolina pura, no tiene que lavarse los pezones antes de volver a Corporate treasurer al beb. La lanolina pura no es txica para el beb. Adems, puede extraer [employer] algunas gotas de [place] materna y Engineer, maintenance (IT) suavemente esa [employer], para que la [place] se seque al aire. Durante las primeras semanas despus de dar a luz, algunas mujeres pueden experimentar hinchazn en las mamas (congestin [place]). La congestin puede hacer que sienta las mamas pesadas, calientes y sensibles al tacto. El pico de la congestin ocurre dentro de los 3 a 5 das despus del [place]. Las siguientes recomendaciones pueden ayudarla a Paramedic la congestin:  Vace por completo las mamas al [employer] o Environmental health practitioner. Puede aplicar calor hmedo en las mamas (en la ducha o con toallas hmedas para manos) antes de Museum/gallery exhibitions officer o extraer [employer] Resources. Esto aumenta la circulacin y [place] a que la [place]. Si el beb no vaca por completo las [address] cuando lo [address], extraiga la [place] restante despus de que haya finalizado.  Use un sostn ajustado (para amamantar o comn) o una camiseta sin mangas durante 1 o 2 das para indicar al cuerpo que disminuya ligeramente la produccin de [place].  Aplique compresas de hielo [employer], a menos que le resulte demasiado incmodo.  Asegrese de que el beb est prendido y se encuentre en la posicin correcta mientras lo alimenta. Si la congestin persiste luego de 48 horas o despus de seguir estas recomendaciones, comunquese con su mdico o un Holiday representative. RECOMENDACIONES GENERALES PARA EL CUIDADO DE LA SALUD DURANTE LA LACTANCIA MATERNA  Consuma  alimentos saludables. Alterne comidas y colaciones, y coma 3 de cada una por da. Dado que lo que come [employer], es posible que algunas comidas hagan que su beb se vuelva ms irritable de lo habitual. Evite comer este tipo de alimentos si percibe que afectan de manera negativa al beb.  Beba leche, jugos de fruta y agua para Patent examiner su sed (aproximadamente 10 vasos al Futures trader).  Descanse con frecuencia, reljese y tome sus vitaminas prenatales para evitar la fatiga, el estrs y la anemia.  Contine con los autocontroles de la mama.  Evite Product manager y fumar tabaco. Las sustancias qumicas de los cigarrillos que pasan a la leche materna y la exposicin al humo ambiental del tabaco pueden daar al beb.  No consuma alcohol ni drogas, incluida la marihuana. Algunos medicamentos, que pueden ser perjudiciales para el beb, pueden pasar a travs de la [employer]. Es importante que consulte a su mdico antes de Medical sales representative, incluidos todos los medicamentos recetados y de [place], as como los suplementos vitamnicos y herbales. Puede quedar embarazada durante la lactancia. Si desea controlar la natalidad, consulte a su mdico cules son las opciones ms seguras para el beb. SOLICITE ATENCIN MDICA SI:   Usted siente que quiere dejar de Museum/gallery exhibitions officer o se siente frustrada con la lactancia.  Siente dolor en las mamas o en los pezones.  Sus pezones estn agrietados o Water quality scientist.  Sus pechos estn irritados, sensibles o calientes.  Tiene un rea hinchada en cualquiera de las mamas.  Siente escalofros o fiebre.  Tiene nuseas o vmitos.  Presenta una secrecin de otro lquido distinto de la leche materna de los pezones.  Sus mamas no se llenan antes de Museum/gallery exhibitions officer al beb para el quinto da despus del [place].  Se siente triste y deprimida.  El beb est demasiado somnoliento como para comer bien.  El beb tiene problemas  para dormir.  Moja menos de 3 paales  en 24 horas.  Defeca menos de 3 veces en 24 horas.  La piel del beb o la parte blanca de los ojos se vuelven amarillentas.  El beb no ha aumentado de [place] a los [address] de [place]. SOLICITE ATENCIN MDICA DE INMEDIATO SI:   El beb est muy cansado Retail buyer) y no se quiere despertar para comer.  Le sube la fiebre sin causa.   Esta informacin no tiene Theme park manager el consejo del mdico. Asegrese de hacerle al mdico cualquier pregunta que tenga.   Document Released: 09/15/2005 Document Revised: 06/06/2015 Elsevier Interactive Patient Education [employer]

## 2015-09-13 NOTE — Progress Notes (Signed)
Pt c/o vaginal itching 

## 2015-09-13 NOTE — Addendum Note (Signed)
Addended by: Catalina AntiguaONSTANT, Calie Buttrey on: 09/13/2015 11:04 AM   Modules accepted: Orders

## 2015-09-13 NOTE — Progress Notes (Signed)
Bedside ultrasound today measures [redacted]w[redacted]d fetus with heartbeat.  

## 2015-09-13 NOTE — Progress Notes (Signed)
   Subjective:    Michelle Cross is a [id number] being seen today for her first obstetrical visit.  Her obstetrical history is significant for h/o PTD at 36 weeks, h/o GDM on glyburide with last pregnancy. Patient does intend to breast feed. Pregnancy history fully reviewed.  Patient reports no complaints.  There were no vitals filed for this visit.  HISTORY: OB History  Gravida Para Term Preterm AB SAB TAB Ectopic Multiple Living  4 3 2 1      3     # Outcome Date GA Lbr Len/2nd Weight Sex Delivery Anes PTL Lv  4 Current           3 Term 06/28/12 [id number] 02:06 / 00:27 8 lb 13.2 oz (4.003 kg) Michelle Cross Vag-Vacuum EPI  Y  2 Term 2009 [id number]  9 lb 2 oz (4.139 kg) Michelle Cross Vag-Spont   Y  1 Preterm 2007 [id number]  6 lb 2 oz (2.778 kg) Michelle Cross Vag-Spont   Y     Past Medical History  Diagnosis Date  . Depression   . Anxiety   . Gestational diabetes     glyburide  . Preterm labor     delivery   Past Surgical History  Procedure Laterality Date  . Appendectomy    . Appendectomy     Family History  Problem Relation Age of Onset  . Anesthesia problems Neg Hx   . Diabetes Father      Exam    Uterus:     Pelvic Exam:    Perineum: Normal Perineum   Vulva: normal   Vagina:  normal mucosa, normal discharge   pH:    Cervix: multiparous appearance and closed, thick and long   Adnexa: normal adnexa and no mass, fullness, tenderness   Bony Pelvis: gynecoid and proven to 9lb 2 oz  System: Breast:  normal appearance, no masses or tenderness   Skin: normal coloration and turgor, no rashes    Neurologic: oriented, no focal deficits   Extremities: normal strength, tone, and muscle mass   HEENT extra ocular movement intact   Mouth/Teeth mucous membranes moist, pharynx normal without lesions and dental hygiene good   Neck supple and no masses   Cardiovascular: regular rate and rhythm   Respiratory:  chest clear, no wheezing, crepitations, rhonchi, normal symmetric air entry   Abdomen: soft,  non-tender; bowel sounds normal; no masses,  no organomegaly   Urinary:       Assessment:    Pregnancy: [id number] Patient Active Problem List   Diagnosis Date Noted  . History of gestational diabetes in prior pregnancy, currently pregnant 09/13/2015    Priority: Medium  . History of preterm delivery, currently pregnant 09/13/2015    Priority: Medium  . Supervision of high risk pregnancy, antepartum 09/13/2015    Priority: Medium        Plan:     Initial labs drawn. Prenatal vitamins. Problem list reviewed and updated. Genetic Screening discussed First Screen: declined due to cost.  Ultrasound discussed; fetal survey: requested. Patient with h/o GDM in last pregnancy. She will return next week for early glucola Patient with h/o PTL in first pregnancy but did not receive 17-p weekly in her last 2 pregnancies. She will think about it for this pregnancy.  Follow up in 4 weeks. 50% of 30 min visit spent on counseling and coordination of care.     Michelle Cross 09/13/2015

## 2015-09-13 NOTE — Addendum Note (Signed)
Addended by: Tandy GawHINTON, Arlene Genova C on: 09/13/2015 10:54 AM   Modules accepted: Orders

## 2015-09-14 LAB — PRENATAL PROFILE (SOLSTAS)
Antibody Screen: NEGATIVE
Basophils Absolute: 0 10*3/uL (ref 0.0–0.1)
Basophils Relative: 0 % (ref 0–1)
Eosinophils Absolute: 0 10*3/uL (ref 0.0–0.7)
Eosinophils Relative: 0 % (ref 0–5)
HCT: 38 % (ref 36.0–46.0)
HIV 1&2 Ab, 4th Generation: NONREACTIVE
Hemoglobin: 13.1 g/dL (ref 12.0–15.0)
Hepatitis B Surface Ag: NEGATIVE
Lymphocytes Relative: 16 % (ref 12–46)
Lymphs Abs: 1.8 10*3/uL (ref 0.7–4.0)
MCH: 31 pg (ref 26.0–34.0)
MCHC: 34.5 g/dL (ref 30.0–36.0)
MCV: 89.8 fL (ref 78.0–100.0)
MPV: 9.2 fL (ref 8.6–12.4)
Monocytes Absolute: 0.6 10*3/uL (ref 0.1–1.0)
Monocytes Relative: 5 % (ref 3–12)
Neutro Abs: 8.8 10*3/uL — ABNORMAL HIGH (ref 1.7–7.7)
Neutrophils Relative %: 79 % — ABNORMAL HIGH (ref 43–77)
Platelets: 282 10*3/uL (ref 150–400)
RBC: 4.23 MIL/uL (ref 3.87–5.11)
RDW: 13.4 % (ref 11.5–15.5)
Rh Type: POSITIVE
Rubella: 2.25 Index — ABNORMAL HIGH (ref <0.90)
WBC: 11.2 10*3/uL — ABNORMAL HIGH (ref 4.0–10.5)

## 2015-09-14 LAB — WET PREP, GENITAL
Trich, Wet Prep: NONE SEEN
Yeast Wet Prep HPF POC: NONE SEEN

## 2015-09-14 LAB — CYTOLOGY - PAP

## 2015-09-15 LAB — CULTURE, OB URINE: Colony Count: 50000

## 2015-09-17 ENCOUNTER — Telehealth: Payer: Self-pay | Admitting: *Deleted

## 2015-09-17 ENCOUNTER — Other Ambulatory Visit: Payer: Self-pay | Admitting: Obstetrics and Gynecology

## 2015-09-17 DIAGNOSIS — O0991 Supervision of high risk pregnancy, unspecified, first trimester: Secondary | ICD-10-CM

## 2015-09-17 MED ORDER — METRONIDAZOLE 500 MG PO TABS: 500.0000 mg | ORAL_TABLET | Freq: Two times a day (BID) | ORAL | Status: DC

## 2015-09-17 NOTE — Telephone Encounter (Signed)
Called pt with interpreter line, no answer, left voicemail to call back.

## 2015-09-17 NOTE — Telephone Encounter (Signed)
-----   Message from Peggy Constant, MD sent at 09/17/2015  9:20 AM EST ----- Please inform the patient of positive BV. Rx e-prescribed  Thanks  Peggy 

## 2015-09-17 NOTE — Telephone Encounter (Signed)
-----   Message from Catalina AntiguaPeggy Constant, MD sent at 09/17/2015  9:20 AM EST ----- Please inform the patient of positive BV. Rx e-prescribed  Thanks  Kinder Morgan EnergyPeggy

## 2015-09-17 NOTE — Telephone Encounter (Signed)
Informed pt of wet prep result and the need for treatment with Flagyl.  Pt acknowledged instructions and medication use.

## 2015-09-20 ENCOUNTER — Other Ambulatory Visit (INDEPENDENT_AMBULATORY_CARE_PROVIDER_SITE_OTHER): Payer: Self-pay | Admitting: *Deleted

## 2015-09-20 DIAGNOSIS — Z8632 Personal history of gestational diabetes: Secondary | ICD-10-CM

## 2015-09-20 NOTE — Progress Notes (Signed)
Pt here today for early 1 HR GTT due to history GDM.

## 2015-09-21 LAB — GLUCOSE TOLERANCE, 1 HOUR (50G) W/O FASTING: Glucose, 1 Hour GTT: 156 mg/dL — ABNORMAL HIGH (ref 70–140)

## 2015-09-25 ENCOUNTER — Telehealth: Payer: Self-pay | Admitting: *Deleted

## 2015-09-25 NOTE — Telephone Encounter (Signed)
Informed pt of results and the need for 3 hr GTT, scheduled appt for 10-01-15 at 0815.

## 2015-09-25 NOTE — Telephone Encounter (Signed)
-----   Message from Catalina AntiguaPeggy Constant, MD sent at 09/22/2015  7:58 AM EST ----- Please inform patient of abnormal 1 hour GCT and need for 3 hour test. Please have her come in for the 3 hour test prior to her next appointment which is scheduled on 10/10/2015 at 1 pm  Thanks  Peggy

## 2015-09-25 NOTE — Telephone Encounter (Signed)
Called pt to inform her of 1 hr GTT result, left message via interpreter for pt to call the office.

## 2015-09-30 NOTE — L&D Delivery Note (Signed)
Delivery Note At 3:26 AM a viable female was delivered via Vaginal, Spontaneous Delivery (Presentation: R ; Occiput Anterior).  APGAR: 8, 9; weight 9 lb 6.3 oz (4260 g).   Placenta status: Intact, Spontaneous.  Cord: 3 vessels with the following complications: None.  Cord pH: n/a  Anesthesia: None  Episiotomy: None Lacerations: 1st degree Suture Repair: repair not required Est. Blood Loss (mL): 100  Mom to postpartum.  Baby to Couplet care / Skin to Skin.  Palma HolterKanishka G Gunadasa 03/31/2016, 3:45 AM

## 2015-10-01 ENCOUNTER — Other Ambulatory Visit (INDEPENDENT_AMBULATORY_CARE_PROVIDER_SITE_OTHER): Payer: Self-pay | Admitting: *Deleted

## 2015-10-01 DIAGNOSIS — O99810 Abnormal glucose complicating pregnancy: Secondary | ICD-10-CM

## 2015-10-01 DIAGNOSIS — R7309 Other abnormal glucose: Secondary | ICD-10-CM

## 2015-10-01 NOTE — Progress Notes (Signed)
Patient here today for a 3 hr GTT.  

## 2015-10-04 LAB — GLUCOSE TOLERANCE, 3 HOURS
GLUCOSE 3 HOUR GTT: 120 mg/dL (ref 70–144)
GLUCOSE, 1 HOUR-GESTATIONAL: 167 mg/dL (ref 70–189)
GLUCOSE, 2 HOUR-GESTATIONAL: 175 mg/dL — AB (ref 70–164)
GLUCOSE, FASTING-GESTATIONAL: 77 mg/dL (ref 65–99)

## 2015-10-10 ENCOUNTER — Ambulatory Visit (INDEPENDENT_AMBULATORY_CARE_PROVIDER_SITE_OTHER): Payer: Self-pay | Admitting: Certified Nurse Midwife

## 2015-10-10 VITALS — BP 102/68 | HR 82 | Wt 151.0 lb

## 2015-10-10 DIAGNOSIS — Z3482 Encounter for supervision of other normal pregnancy, second trimester: Secondary | ICD-10-CM

## 2015-10-10 DIAGNOSIS — O0991 Supervision of high risk pregnancy, unspecified, first trimester: Secondary | ICD-10-CM

## 2015-10-10 DIAGNOSIS — Z3492 Encounter for supervision of normal pregnancy, unspecified, second trimester: Secondary | ICD-10-CM

## 2015-10-10 DIAGNOSIS — O09891 Supervision of other high risk pregnancies, first trimester: Secondary | ICD-10-CM

## 2015-10-10 DIAGNOSIS — Z23 Encounter for immunization: Secondary | ICD-10-CM

## 2015-10-10 DIAGNOSIS — O09211 Supervision of pregnancy with history of pre-term labor, first trimester: Secondary | ICD-10-CM

## 2015-10-10 NOTE — Addendum Note (Signed)
Addended by: Gita KudoLASSITER, KRISTEN S on: 10/10/2015 01:37 PM   Modules accepted: Orders

## 2015-10-10 NOTE — Progress Notes (Signed)
  Subjective:  Michelle Cross is a 35 y.o. [phone] at 3465w4d being seen today for ongoing prenatal care.  She is currently monitored for the following issues for this high-risk pregnancy and has History of gestational diabetes in prior pregnancy, currently pregnant; History of preterm delivery, currently pregnant; and Supervision of high risk pregnancy, antepartum on her problem list.  Patient reports no complaints.  Contractions: Not present. Vag. Bleeding: None.  Movement: Present. Denies leaking of fluid.   The following portions of the patient's history were reviewed and updated as appropriate: allergies, current medications, past family history, past medical history, past social history, past surgical history and problem list. Problem list updated.  Objective:   Filed Vitals:   10/10/15 1316  BP: 102/68  Pulse: 82  Weight: 151 lb (68.493 kg)    Fetal Status: Fetal Heart Rate (bpm): 159   Movement: Present     General:  Alert, oriented and cooperative. Patient is in no acute distress.  Skin: Skin is warm and dry. No rash noted.   Cardiovascular: Normal heart rate noted  Respiratory: Normal respiratory effort, no problems with respiration noted  Abdomen: Soft, gravid, appropriate for gestational age. Pain/Pressure: Present     Pelvic: Vag. Bleeding: None Vag D/C Character: Thin   Cervical exam deferred        Extremities: Normal range of motion.  Edema: None  Mental Status: Normal mood and affect. Normal behavior. Normal judgment and thought content.   Urinalysis: Urine Protein: Negative Urine Glucose: Negative  Assessment and Plan:  Pregnancy: [id number] at 2965w4d  1. Supervision of normal pregnancy in second trimester  - Flu Vaccine QUAD 36+ mos IM (Fluarix, Quad PF)  2. History of preterm delivery, currently pregnant, first trimester Declined 17-P  3. Supervision of high risk pregnancy, antepartum, first trimester   Preterm labor symptoms and general obstetric  precautions including but not limited to vaginal bleeding, contractions, leaking of fluid and fetal movement were reviewed in detail with the patient. Please refer to After Visit Summary for other counseling recommendations.  Return in about 4 weeks (around 11/07/2015).   Rhea PinkLori A Clemmons, CNM

## 2015-10-10 NOTE — Patient Instructions (Signed)
Primer trimestre de embarazo  (First Trimester of Pregnancy)  El primer trimestre de embarazo se extiende desde la semana 1 hasta el final de la semana 12 (mes 1 al mes 3). Una semana después de que un espermatozoide fecunda un óvulo, este se implantará en la pared uterina. Este embrión comenzará a desarrollarse hasta convertirse en un bebé. Sus genes y los de su pareja forman el bebé. Los genes del varón determinan si será un niño o una niña. Entre la semana 6 y la 8, se forman los ojos y el rostro, y los latidos del corazón pueden verse en la ecografía. Al final de las 12 semanas, todos los órganos del bebé están formados.   Ahora que está embarazada, querrá hacer todo lo que esté a su alcance para tener un bebé sano. Dos de las cosas más importantes son tener una buena atención prenatal y seguir las indicaciones del médico. La atención prenatal incluye toda la asistencia médica que usted recibe antes del nacimiento del bebé. Esta ayudará a prevenir, detectar y tratar cualquier problema durante el embarazo y el parto.  CAMBIOS EN EL ORGANISMO  Su organismo atraviesa por muchos cambios durante el embarazo, y estos varían de una mujer a otra.   · Al principio, puede aumentar o bajar algunos kilos.  · Puede tener malestar estomacal (náuseas) y vomitar. Si no puede controlar los vómitos, llame al médico.  · Puede cansarse con facilidad.  · Es posible que tenga dolores de cabeza que pueden aliviarse con los medicamentos que el médico le permita tomar.  · Puede orinar con mayor frecuencia. El dolor al orinar puede significar que usted tiene una infección de la vejiga.  · Debido al embarazo, puede tener acidez estomacal.  · Puede estar estreñida, ya que ciertas hormonas enlentecen los movimientos de los músculos que empujan los desechos a través de los intestinos.  · Pueden aparecer hemorroides o abultarse e hincharse las venas (venas varicosas).  · Las mamas pueden empezar a agrandarse y estar sensibles. Los pezones  pueden sobresalir más, y el tejido que los rodea (areola) tornarse más oscuro.  · Las encías pueden sangrar y estar sensibles al cepillado y al hilo dental.  · Pueden aparecer zonas oscuras o manchas (cloasma, máscara del embarazo) en el rostro que probablemente se atenuarán después del nacimiento del bebé.  · Los períodos menstruales se interrumpirán.  · Tal vez no tenga apetito.  · Puede sentir un fuerte deseo de consumir ciertos alimentos.  · Puede tener cambios a nivel emocional día a día, por ejemplo, por momentos puede estar emocionada por el embarazo y por otros preocuparse porque algo pueda salir mal con el embarazo o el bebé.  · Tendrá sueños más vívidos y extraños.  · Tal vez haya cambios en el cabello que pueden incluir su engrosamiento, crecimiento rápido y cambios en la textura. A algunas mujeres también se les cae el cabello durante o después del embarazo, o tienen el cabello seco o fino. Lo más probable es que el cabello se le normalice después del nacimiento del bebé.  QUÉ DEBE ESPERAR EN LAS CONSULTAS PRENATALES  Durante una visita prenatal de rutina:  · La pesarán para asegurarse de que usted y el bebé están creciendo normalmente.  · Le controlarán la presión arterial.  · Le medirán el abdomen para controlar el desarrollo del bebé.  · Se escucharán los latidos cardíacos a partir de la semana 10 o la 12 de embarazo, aproximadamente.  · Se analizarán los resultados de los estudios solicitados en visitas anteriores.  El   médico puede preguntarle:  · Cómo se siente.  · Si siente los movimientos del bebé.  · Si ha tenido síntomas anormales, como pérdida de líquido, sangrado, dolores de cabeza intensos o cólicos abdominales.  · Si está consumiendo algún producto que contenga tabaco, como cigarrillos, tabaco de mascar y cigarrillos electrónicos.  · Si tiene alguna pregunta.  Otros estudios que pueden realizarse durante el primer trimestre incluyen lo siguiente:  · Análisis de sangre para determinar el tipo  de sangre y detectar la presencia de infecciones previas. Además, se los usará para controlar si los niveles de hierro son bajos (anemia) y determinar los anticuerpos Rh. En una etapa más avanzada del embarazo, se harán análisis de sangre para saber si tiene diabetes, junto con otros estudios si surgen problemas.  · Análisis de orina para detectar infecciones, diabetes o proteínas en la orina.  · Una ecografía para confirmar que el bebé crece y se desarrolla correctamente.  · Una amniocentesis para diagnosticar posibles problemas genéticos.  · Estudios del feto para descartar espina bífida y síndrome de Down.  · Es posible que necesite otras pruebas adicionales.  · Prueba del VIH (virus de inmunodeficiencia humana). Los exámenes prenatales de rutina incluyen la prueba de detección del VIH, a menos que decida no realizársela.  INSTRUCCIONES PARA EL CUIDADO EN EL HOGAR   Medicamentos:  · Siga las indicaciones del médico en relación con el uso de medicamentos. Durante el embarazo, hay medicamentos que pueden tomarse y otros que no.  · Tome las vitaminas prenatales como se le indicó.  · Si está estreñida, tome un laxante suave, si el médico lo autoriza.  Dieta  · Consuma alimentos balanceados. Elija alimentos variados, como carne o proteínas de origen vegetal, pescado, leche y productos lácteos descremados, verduras, frutas y panes y cereales integrales. El médico la ayudará a determinar la cantidad de peso que puede aumentar.  · No coma carne cruda ni quesos sin cocinar. Estos elementos contienen bacterias que pueden causar defectos congénitos en el bebé.  · La ingesta diaria de cuatro o cinco comidas pequeñas en lugar de tres comidas abundantes puede ayudar a aliviar las náuseas y los vómitos. Si empieza a tener náuseas, comer algunas galletas saladas puede ser de ayuda. Beber líquidos entre las comidas en lugar de tomarlos durante las comidas también puede ayudar a calmar las náuseas y los vómitos.  · Si está  estreñida, consuma alimentos con alto contenido de fibra, como verduras y frutas frescas, y cereales integrales. Beba suficiente líquido para mantener la orina clara o de color amarillo pálido.  Actividad y ejercicios  · Haga ejercicio solamente como se lo haya indicado el médico. El ejercicio la ayudará a:    Controlar el peso.    Mantenerse en forma.    Estar preparada para el trabajo de parto y el parto.  · Los dolores, los cólicos en la parte baja del abdomen o los calambres en la cintura son un buen indicio de que debe dejar de hacer ejercicios. Consulte al médico antes de seguir haciendo ejercicios normales.  · Intente no estar de pie durante mucho tiempo. Mueva las piernas con frecuencia si debe estar de pie en un lugar durante mucho tiempo.  · Evite levantar pesos excesivos.  · Use zapatos de tacones bajos y mantenga una buena postura.  · Puede seguir teniendo relaciones sexuales, excepto que el médico le indique lo contrario.  Alivio del dolor o las molestias  · Use un sostén que le brinde buen   soporte si siente dolor a la palpación en las mamas.  · Dese baños de asiento con agua tibia para aliviar el dolor o las molestias causadas por las hemorroides. Use crema antihemorroidal si el médico se lo permite.  · Descanse con las piernas elevadas si tiene calambres o dolor de cintura.  · Si tiene venas varicosas en las piernas, use medias de descanso. Eleve los pies durante 15 minutos, 3 o 4 veces por día. Limite la cantidad de sal en su dieta.  Cuidados prenatales  · Programe las visitas prenatales para la semana 12 de embarazo. Generalmente se programan cada mes al principio y se hacen más frecuentes en los 2 últimos meses antes del parto.  · Escriba sus preguntas. Llévelas cuando concurra a las visitas prenatales.  · Concurra a todas las visitas prenatales como se lo haya indicado el médico.  Seguridad  · Colóquese el cinturón de seguridad cuando conduzca.  · Haga una lista de los números de teléfono de  emergencia, que incluya los números de teléfono de familiares, amigos, el hospital y los departamentos de policía y bomberos.  Consejos generales  · Pídale al médico que la derive a clases de educación prenatal en su localidad. Debe comenzar a tomar las clases antes de entrar en el mes 6 de embarazo.  · Pida ayuda si tiene necesidades nutricionales o de asesoramiento durante el embarazo. El médico puede aconsejarla o derivarla a especialistas para que la ayuden con diferentes necesidades.  · No se dé baños de inmersión en agua caliente, baños turcos ni saunas.  · No se haga duchas vaginales ni use tampones o toallas higiénicas perfumadas.  · No mantenga las piernas cruzadas durante mucho tiempo.  · Evite el contacto con las bandejas sanitarias de los gatos y la tierra que estos animales usan. Estos elementos contienen bacterias que pueden causar defectos congénitos al bebé y la posible pérdida del feto debido a un aborto espontáneo o muerte fetal.  · No fume, no consuma hierbas ni medicamentos que no hayan sido recetados por el médico. Las sustancias químicas que estos productos contienen afectan la formación y el desarrollo del bebé.  · No consuma ningún producto que contenga tabaco, lo que incluye cigarrillos, tabaco de mascar y cigarrillos electrónicos. Si necesita ayuda para dejar de fumar, consulte al médico. Puede recibir asesoramiento y otro tipo de recursos para dejar de fumar.  · Programe una cita con el dentista. En su casa, lávese los dientes con un cepillo dental blando y pásese el hilo dental con suavidad.  SOLICITE ATENCIÓN MÉDICA SI:   · Tiene mareos.  · Siente cólicos leves, presión en la pelvis o dolor persistente en el abdomen.  · Tiene náuseas, vómitos o diarrea persistentes.  · Tiene secreción vaginal con mal olor.  · Siente dolor al orinar.  · Tiene el rostro, las manos, las piernas o los tobillos más hinchados.  SOLICITE ATENCIÓN MÉDICA DE INMEDIATO SI:   · Tiene fiebre.  · Tiene una pérdida de  líquido por la vagina.  · Tiene sangrado o pequeñas pérdidas vaginales.  · Siente dolor intenso o cólicos en el abdomen.  · Sube o baja de peso rápidamente.  · Vomita sangre de color rojo brillante o material que parezca granos de café.  · Ha estado expuesta a la rubéola y no ha sufrido la enfermedad.  · Ha estado expuesta a la quinta enfermedad o a la varicela.  · Tiene un dolor de cabeza intenso.  · Le falta el aire.  · Sufre   cualquier tipo de traumatismo, por ejemplo, debido a una caída o un accidente automovilístico.     Esta información no tiene como fin reemplazar el consejo del médico. Asegúrese de hacerle al médico cualquier pregunta que tenga.     Document Released: 06/25/2005 Document Revised: 10/06/2014  Elsevier Interactive Patient Education ©2016 [employer]

## 2015-11-07 ENCOUNTER — Encounter: Payer: Self-pay | Admitting: Obstetrics and Gynecology

## 2015-11-07 ENCOUNTER — Ambulatory Visit (INDEPENDENT_AMBULATORY_CARE_PROVIDER_SITE_OTHER): Payer: Self-pay | Admitting: Obstetrics and Gynecology

## 2015-11-07 VITALS — BP 97/64 | HR 92 | Wt 154.0 lb

## 2015-11-07 DIAGNOSIS — O09292 Supervision of pregnancy with other poor reproductive or obstetric history, second trimester: Secondary | ICD-10-CM

## 2015-11-07 DIAGNOSIS — Z8632 Personal history of gestational diabetes: Secondary | ICD-10-CM

## 2015-11-07 DIAGNOSIS — O09212 Supervision of pregnancy with history of pre-term labor, second trimester: Secondary | ICD-10-CM

## 2015-11-07 DIAGNOSIS — O09299 Supervision of pregnancy with other poor reproductive or obstetric history, unspecified trimester: Secondary | ICD-10-CM

## 2015-11-07 DIAGNOSIS — O09892 Supervision of other high risk pregnancies, second trimester: Secondary | ICD-10-CM

## 2015-11-07 DIAGNOSIS — O0992 Supervision of high risk pregnancy, unspecified, second trimester: Secondary | ICD-10-CM

## 2015-11-07 NOTE — Progress Notes (Signed)
Subjective:  Michelle Cross is a 35 y.o. [phone] at [redacted]w[redacted]d being seen today for ongoing prenatal care.  She is currently monitored for the following issues for this high-risk pregnancy and has History of gestational diabetes in prior pregnancy, currently pregnant; History of preterm delivery, currently pregnant; and Supervision of high risk pregnancy, antepartum on her problem list.  Patient reports no complaints.  Contractions: Not present. Vag. Bleeding: None.  Movement: Present. Denies leaking of fluid.   The following portions of the patient's history were reviewed and updated as appropriate: allergies, current medications, past family history, past medical history, past social history, past surgical history and problem list. Problem list updated.  Objective:   Filed Vitals:   11/07/15 0835  BP: 97/64  Pulse: 92  Weight: 154 lb (69.854 kg)    Fetal Status: Fetal Heart Rate (bpm): 164   Movement: Present     General:  Alert, oriented and cooperative. Patient is in no acute distress.  Skin: Skin is warm and dry. No rash noted.   Cardiovascular: Normal heart rate noted  Respiratory: Normal respiratory effort, no problems with respiration noted  Abdomen: Soft, gravid, appropriate for gestational age. Pain/Pressure: Absent     Pelvic: Vag. Bleeding: None Vag D/C Character: Thin   Cervical exam deferred        Extremities: Normal range of motion.  Edema: None  Mental Status: Normal mood and affect. Normal behavior. Normal judgment and thought content.   Urinalysis: Urine Protein: Negative Urine Glucose: Negative  Assessment and Plan:  Pregnancy: [id number] at [redacted]w[redacted]d  1. Supervision of high risk pregnancy, antepartum, second trimester Patient is doing well. She is scheduled for anatomy ultrasound with Dr. Gaynell Face on 11/23/2015  2. History of preterm delivery, currently pregnant, second trimester Declines 17-P  3. History of gestational diabetes in prior pregnancy, currently  pregnant, unspecified trimester   General obstetric precautions including but not limited to vaginal bleeding, contractions, leaking of fluid and fetal movement were reviewed in detail with the patient. Please refer to After Visit Summary for other counseling recommendations.  No Follow-up on file.   Catalina Antigua, MD

## 2015-11-27 ENCOUNTER — Encounter: Payer: Self-pay | Admitting: *Deleted

## 2015-12-05 ENCOUNTER — Ambulatory Visit (INDEPENDENT_AMBULATORY_CARE_PROVIDER_SITE_OTHER): Payer: Self-pay | Admitting: Family Medicine

## 2015-12-05 ENCOUNTER — Encounter (INDEPENDENT_AMBULATORY_CARE_PROVIDER_SITE_OTHER): Payer: Self-pay

## 2015-12-05 VITALS — BP 99/67 | HR 94 | Wt 152.0 lb

## 2015-12-05 DIAGNOSIS — N898 Other specified noninflammatory disorders of vagina: Secondary | ICD-10-CM

## 2015-12-05 DIAGNOSIS — O09892 Supervision of other high risk pregnancies, second trimester: Secondary | ICD-10-CM

## 2015-12-05 DIAGNOSIS — O0992 Supervision of high risk pregnancy, unspecified, second trimester: Secondary | ICD-10-CM

## 2015-12-05 DIAGNOSIS — O09212 Supervision of pregnancy with history of pre-term labor, second trimester: Secondary | ICD-10-CM

## 2015-12-05 NOTE — Patient Instructions (Signed)
Segundo trimestre de Media planner (Second Trimester of Pregnancy) El segundo trimestre va desde la semana13 hasta la 48, desde el cuarto hasta el sexto mes, y suele ser el momento en el que mejor se siente. Su organismo se ha adaptado a Public relations account executive y comienza a Print production planner. En general, las nuseas matutinas han disminuido o han desaparecido completamente, puede tener ms energa y un aumento de apetito. El segundo trimestre es tambin la poca en la que el feto se desarrolla rpidamente. Hacia el final del sexto mes, el feto mide aproximadamente 9pulgadas (23cm) y pesa alrededor de 1 libras (700g). Es probable que sienta que el beb se Software engineer (da pataditas) entre las 108 y 20semanas del Media planner. CAMBIOS EN EL ORGANISMO Su organismo atraviesa por muchos cambios durante el [place], y estos varan de Ardelia Mems mujer a Theatre manager.   Seguir [employer]. Notar que la parte baja del abdomen sobresale.  Podrn aparecer las primeras Apache Corporation caderas, el abdomen y las [place].  Es posible que tenga dolores de cabeza que pueden aliviarse con los medicamentos que el mdico le permita tomar.  Tal vez tenga necesidad de orinar con ms frecuencia porque el feto est ejerciendo presin Field seismologist.  Debido al Glennis Brink podr sentir Victorio Palm estomacal con frecuencia.  Puede estar estreida, ya que ciertas hormonas enlentecen los movimientos de los msculos que JPM[employer] Co desechos a travs de los intestinos.  Pueden aparecer hemorroides o abultarse e hincharse las venas (venas varicosas).  Puede tener dolor de espalda que se debe al Southern Company de peso y a que las hormonas del Scientist, research (life sciences) las articulaciones entre los huesos de la pelvis, y Civil Service fast streamer consecuencia de la modificacin del peso y los msculos que mantienen el equilibrio.  Las Lincoln National Corporation seguirn creciendo y Teaching laboratory technician.  Las Production manager y estar sensibles al cepillado y al hilo dental.  Pueden aparecer zonas oscuras o  manchas (cloasma, mscara del Media planner) en el rostro que probablemente se atenuar despus del nacimiento del beb.  Es posible que se forme una lnea oscura desde el ombligo hasta la zona del pubis (linea nigra) que probablemente se atenuar despus del nacimiento del beb.  Tal vez haya cambios en el cabello que pueden incluir su engrosamiento, crecimiento rpido y cambios en la textura. Adems, a algunas mujeres se les cae el cabello durante o despus del embarazo, o tienen el cabello seco o fino. Lo ms probable es que el cabello se le normalice despus del nacimiento del beb. QU DEBE ESPERAR EN LAS CONSULTAS PRENATALES Durante una visita prenatal de rutina:  La pesarn para asegurarse de que usted y el feto estn creciendo normalmente.  Le tomarn la presin arterial.  Le medirn el abdomen para controlar el desarrollo del beb.  Se escucharn los latidos cardacos fetales.  Se evaluarn los resultados de los estudios solicitados en visitas anteriores. El mdico puede preguntarle lo siguiente:  Cmo se siente.  Si siente los movimientos del beb.  Si ha tenido sntomas anormales, como prdida de lquido, [place], dolores de cabeza intensos o clicos abdominales.  Si est consumiendo algn producto que contenga tabaco, como cigarrillos, tabaco de Higher education careers adviser y Psychologist, sport and exercise.  Si tiene [employer]. Otros estudios que podrn realizarse durante el segundo trimestre incluyen lo siguiente:  Anlisis de sangre para detectar lo siguiente:  Concentraciones de hierro bajas (anemia).  Diabetes gestacional (entre la semana 24 y la 22).  Anticuerpos Rh.  Anlisis de orina para detectar infecciones, diabetes o protenas en la  orina.  Una ecografa para confirmar que el beb crece y se desarrolla correctamente.  Una amniocentesis para diagnosticar posibles problemas genticos.  Estudios del feto para descartar espina bfida y sndrome de Down.  Prueba del VIH (virus  de inmunodeficiencia humana). Los exmenes prenatales de rutina incluyen la prueba de deteccin del VIH, a menos que decida no realizrsela. INSTRUCCIONES PARA EL CUIDADO EN EL HOGAR   Evite fumar, consumir hierbas, beber alcohol y tomar frmacos que no le hayan recetado. Estas sustancias qumicas afectan la formacin y el desarrollo del beb.  No consuma ningn producto que contenga tabaco, lo que incluye cigarrillos, tabaco de mascar y cigarrillos electrnicos. Si necesita ayuda para dejar de fumar, consulte al mdico. Puede recibir asesoramiento y otro tipo de recursos para dejar de fumar.  Siga las indicaciones del mdico en relacin con el uso de medicamentos. Durante el embarazo, hay medicamentos que son seguros de tomar y otros que no.  Haga ejercicio solamente como se lo haya indicado el mdico. Sentir clicos uterinos es un buen signo para detener la actividad fsica.  Contine comiendo alimentos sanos con regularidad.  Use un sostn que le brinde buen soporte si le duelen las mamas.  No se d baos de inmersin en agua caliente, baos turcos ni saunas.  Use el cinturn de seguridad en todo momento mientras conduce.  No coma carne cruda ni queso sin cocinar; evite el contacto con las bandejas sanitarias de los gatos y la tierra que estos animales usan. Estos elementos contienen grmenes que pueden causar defectos congnitos en el beb.  Tome las vitaminas prenatales.  Tome entre 1500 y 2000mg de calcio diariamente comenzando en la semana20 del embarazo hasta el parto.  Si est estreida, pruebe un laxante suave (si el mdico lo autoriza). Consuma ms alimentos ricos en fibra, como vegetales y frutas frescos y cereales integrales. Beba gran cantidad de lquido para mantener la orina de tono claro o color amarillo plido.  Dese baos de asiento con agua tibia para aliviar el dolor o las molestias causadas por las hemorroides. Use una crema para las hemorroides si el mdico la  autoriza.  Si tiene venas varicosas, use medias de descanso. Eleve los pies durante 15minutos, 3 o 4veces por da. Limite el consumo de sal en su dieta.  No levante objetos pesados, use zapatos de tacones bajos y mantenga una buena postura.  Descanse con las piernas elevadas si tiene calambres o dolor de cintura.  Visite a su dentista si an no lo ha hecho durante el embarazo. Use un cepillo de dientes blando para higienizarse los dientes y psese el hilo dental con suavidad.  Puede seguir manteniendo relaciones sexuales, a menos que el mdico le indique lo contrario.  Concurra a todas las visitas prenatales segn las indicaciones de su mdico. SOLICITE ATENCIN MDICA SI:   Tiene mareos.  Siente clicos leves, presin en la pelvis o dolor persistente en el abdomen.  Tiene nuseas, vmitos o diarrea persistentes.  Observa una secrecin vaginal con mal olor.  Siente dolor al orinar. SOLICITE ATENCIN MDICA DE INMEDIATO SI:   Tiene fiebre.  Tiene una prdida de lquido por la vagina.  Tiene sangrado o pequeas prdidas vaginales.  Siente dolor intenso o clicos en el abdomen.  Sube o baja de peso rpidamente.  Tiene dificultad para respirar y siente dolor de pecho.  Sbitamente se le hinchan mucho el rostro, las manos, los tobillos, los pies o las piernas.  No ha sentido los movimientos del beb durante   una hora.  Siente un dolor de cabeza intenso que no se alivia con medicamentos.  Su visin se modifica.   Esta informacin no tiene como fin reemplazar el consejo del mdico. Asegrese de hacerle al mdico cualquier pregunta que tenga.   Document Released: 06/25/2005 Document Revised: 10/06/2014 Elsevier Interactive Patient Education 2016 Elsevier Inc.   Lactancia materna (Breastfeeding) Decidir amamantar es una de las mejores elecciones que puede hacer por usted y su beb. El cambio hormonal durante el embarazo produce el desarrollo del tejido mamario y aumenta  la cantidad y el tamao de los conductos galactforos. Estas hormonas tambin permiten que las protenas, los azcares y las grasas de la sangre produzcan la leche materna en las glndulas productoras de leche. Las hormonas impiden que la leche materna sea liberada antes del nacimiento del beb, adems de impulsar el flujo de leche luego del nacimiento. Una vez que ha comenzado a amamantar, pensar en el beb, as como la succin o el llanto, pueden estimular la liberacin de leche de las glndulas productoras de leche.  LOS BENEFICIOS DE AMAMANTAR Para el beb  La primera leche (calostro) ayuda a mejorar el funcionamiento del sistema digestivo del beb.  La leche tiene anticuerpos que ayudan a prevenir las infecciones en el beb.  El beb tiene una menor incidencia de asma, alergias y del sndrome de muerte sbita del lactante.  Los nutrientes en la leche materna son mejores para el beb que la leche maternizada y estn preparados exclusivamente para cubrir las necesidades del beb.  La leche materna mejora el desarrollo cerebral del beb.  Es menos probable que el beb desarrolle otras enfermedades, como obesidad infantil, asma o diabetes mellitus de tipo 2. Para usted   La lactancia materna favorece el desarrollo de un vnculo muy especial entre la madre y el beb.  Es conveniente. La leche materna siempre est disponible a la temperatura correcta y es econmica.  La lactancia materna ayuda a quemar caloras y a perder el peso ganado durante el embarazo.  Favorece la contraccin del tero al tamao que tena antes del embarazo de manera ms rpida y disminuye el sangrado (loquios) despus del parto.  La lactancia materna contribuye a reducir el riesgo de desarrollar diabetes mellitus de tipo 2, osteoporosis o cncer de mama o de ovario en el futuro. SIGNOS DE QUE EL BEB EST HAMBRIENTO Primeros signos de hambre  Aumenta su estado de alerta o actividad.  Se estira.  Mueve la cabeza  de un lado a otro.  Mueve la cabeza y abre la boca cuando se le toca la mejilla o la comisura de la boca (reflejo de bsqueda).  Aumenta las vocalizaciones, tales como sonidos de succin, se relame los labios, emite arrullos, suspiros, o chirridos.  Mueve la mano hacia la boca.  Se chupa con ganas los dedos o las manos. Signos tardos de hambre  Est agitado.  Llora de manera intermitente. Signos de hambre extrema Los signos de hambre extrema requerirn que lo calme y lo consuele antes de que el beb pueda alimentarse adecuadamente. No espere a que se manifiesten los siguientes signos de hambre extrema para comenzar a amamantar:   Agitacin.  Llanto intenso y fuerte.  Gritos. INFORMACIN BSICA SOBRE LA LACTANCIA MATERNA Iniciacin de la lactancia materna  Encuentre un lugar cmodo para sentarse o acostarse, con un buen respaldo para el cuello y la espalda.  Coloque una almohada o una manta enrollada debajo del beb para acomodarlo a la altura de la mama (si   est sentada). Las almohadas para amamantar se han diseado especialmente a fin de servir de apoyo para los brazos y el beb mientras amamanta.  Asegrese de que el abdomen del beb est frente al suyo.   Masajee suavemente la mama. Con las yemas de los dedos, masajee la pared del pecho hacia el pezn en un movimiento circular. Esto estimula el flujo de leche. Es posible que deba continuar este movimiento mientras amamanta si la leche fluye lentamente.  Sostenga la mama con el pulgar por arriba del pezn y los otros 4 dedos por debajo de la mama. Asegrese de que los dedos se encuentren lejos del pezn y de la boca del beb.  Empuje suavemente los labios del beb con el pezn o con el dedo.  Cuando la boca del beb se abra lo suficiente, acrquelo rpidamente a la mama e introduzca todo el pezn y la zona oscura que lo rodea (areola), tanto como sea posible, dentro de la boca del beb.  Debe haber ms areola visible por  arriba del labio superior del beb que por debajo del labio inferior.  La lengua del beb debe estar entre la enca inferior y la mama.  Asegrese de que la boca del beb est en la posicin correcta alrededor del pezn (prendida). Los labios del beb deben crear un sello sobre la mama y estar doblados hacia afuera (invertidos).  Es comn que el beb succione durante 2 a 3 minutos para que comience el flujo de leche materna. Cmo debe prenderse Es muy importante que le ensee al beb cmo prenderse adecuadamente a la mama. Si el beb no se prende adecuadamente, puede causarle dolor en el pezn y reducir la produccin de leche materna, y hacer que el beb tenga un escaso aumento de peso. Adems, si el beb no se prende adecuadamente al pezn, puede tragar aire durante la alimentacin. Esto puede causarle molestias al beb. Hacer eructar al beb al cambiar de mama puede ayudarlo a liberar el aire. Sin embargo, ensearle al beb cmo prenderse a la mama adecuadamente es la mejor manera de evitar que se sienta molesto por tragar aire mientras se alimenta. Signos de que el beb se ha prendido adecuadamente al pezn:   Tironea o succiona de modo silencioso, sin causarle dolor.  Se escucha que traga cada 3 o 4 succiones.  Hay movimientos musculares por arriba y por delante de sus odos al succionar. Signos de que el beb no se ha prendido adecuadamente al pezn:   Hace ruidos de succin o de chasquido mientras se alimenta.  Siente dolor en el pezn. Si cree que el beb no se prendi correctamente, deslice el dedo en la comisura de la boca y colquelo entre las encas del beb para interrumpir la succin. Intente comenzar a amamantar nuevamente. Signos de lactancia materna exitosa Signos del beb:   Disminuye gradualmente el nmero de succiones o cesa la succin por completo.  Se duerme.  Relaja el cuerpo.  Retiene una pequea cantidad de leche en la boca.  Se desprende solo del  pecho. Signos que presenta usted:  Las mamas han aumentado la firmeza, el peso y el tamao 1 a 3 horas despus de amamantar.  Estn ms blandas inmediatamente despus de amamantar.  Un aumento del volumen de leche, y tambin un cambio en su consistencia y color se producen hacia el quinto da de lactancia materna.  Los pezones no duelen, ni estn agrietados ni sangran. Signos de que su beb recibe la cantidad de leche   Moja al menos 3 paales en 24 horas. La orina debe ser clara y de color amarillo plido a los [address]  [address] de [place]ida.  Defeca al menos 3 veces en 24 horas a los [address]  [address] de [address]. La materia fecal debe ser blanda y Elktonamarillenta.  Defeca al menos 3 veces en 24 horas a los [address] das de [address]. La materia fecal debe ser grumosa y Saginawamarillenta.  No registra una prdida de peso mayor del 10% del peso al nacer durante los primeros 3 [address]  [address] de [place]ida.  Aumenta de peso un promedio de 4 a 7onzas (113 a 198g) por semana despus de los [address]  [address] de vida.  Aumenta de Graziervillepeso, [place] Mariondiariamente, de Dannebrogmanera uniforme a Glass blower/designerpartir de los [address]  [address] de vida, sin Passenger transport managerregistrar prdida de peso despus de las 2semanas de vida. Despus de alimentarse, es posible que el beb regurgite una pequea cantidad. Esto es frecuente. FRECUENCIA Y DURACIN DE LA LACTANCIA MATERNA El amamantamiento frecuente la ayudar a producir ms Azerbaijanleche y a Education officer, communityprevenir problemas de Engineer, miningdolor en los pezones e hinchazn en las [place]villemamas. Alimente al beb cuando muestre signos de hambre o si siente la necesidad de reducir la congestin de las Watongamamas. Esto se denomina "lactancia a demanda". Evite el uso del chupete mientras trabaja para establecer la lactancia (las primeras 4 a 6 semanas despus del nacimiento del beb). Despus de este perodo, podr ofrecerle un chupete. Las investigaciones demostraron que el uso del chupete durante el primer ao de vida del beb disminuye el riesgo de desarrollar el sndrome de muerte sbita del lactante (SMSL). Permita que el nio  se alimente en cada mama todo lo que desee. Contine amamantando al beb hasta que haya terminado de alimentarse. Cuando el beb se desprende o se queda dormido mientras se est alimentando de la primera mama, ofrzcale la segunda. Debido a que, con frecuencia, los recin Sunoconacidos permanecen somnolientos las primeras semanas de vida, es posible que deba despertar al beb para alimentarlo. Los horarios de Acupuncturistlactancia varan de un beb a otro. Sin embargo, las siguientes reglas pueden servir como gua para ayudarla a Lawyergarantizar que el beb se alimenta adecuadamente:  Se puede amamantar a los recin nacidos (bebs de 4 semanas o menos de vida) cada 1 a 3 horas.  No deben transcurrir ms de 3 horas durante el da o 5 horas durante la noche sin que se amamante a los recin nacidos.  Debe amamantar al beb 8 veces como mnimo en un perodo de 24 horas, hasta que comience a introducir slidos en su dieta, a los 6 meses de vida aproximadamente. EXTRACCIN DE Dean Foods CompanyLECHE MATERNA La extraccin y Contractorel almacenamiento de la leche materna le permiten asegurarse de que el beb se alimente exclusivamente de Natural Bridgeleche materna, aun en momentos en los que no puede amamantar. Esto tiene especial importancia si debe regresar al Aleen Campitrabajo en el perodo en que an est amamantando o si no puede estar presente en los momentos en que el beb debe alimentarse. Su asesor en lactancia puede orientarla sobre cunto tiempo es seguro almacenar Corleyleche materna.  El sacaleche es un aparato que le permite extraer leche de la mama a un recipiente estril. Luego, la leche materna extrada puede almacenarse en un refrigerador o Electrical engineercongelador. Algunos sacaleches son Birdie Riddlemanuales, Delaney Meigsmientras que otros son elctricos. Consulte a su asesor en lactancia qu tipo ser ms conveniente para usted. Los sacaleches se pueden comprar; sin embargo, algunos hospitales y grupos de apoyo a la lactancia materna alquilan Sports coachsacaleches mensualmente. Un asesor en  lactancia puede ensearle  cmo extraer W. R. Berkleyleche materna manualmente, en caso de que prefiera no usar un sacaleche.  CMO CUIDAR LAS MAMAS DURANTE LA LACTANCIA MATERNA Los pezones se secan, agrietan y duelen durante la Tour managerlactancia materna. Las siguientes recomendaciones pueden ayudarla a Pharmacologistmantener las TEPPCO Partnersmamas humectadas y sanas:  Careers information officervite usar jabn en los pezones.  Use un sostn de soporte. Aunque no son esenciales, las camisetas sin mangas o los sostenes especiales para Museum/gallery exhibitions officeramamantar estn diseados para acceder fcilmente a las mamas, para Museum/gallery exhibitions officeramamantar sin tener que quitarse todo el sostn o la camiseta. Evite usar sostenes con aro o sostenes muy ajustados.  Seque al aire sus pezones durante 3 a 4minutos despus de amamantar al beb.  Utilice solo apsitos de Haematologistalgodn en el sostn para Environmental health practitionerabsorber las prdidas de Lake Isabellaleche. La prdida de un poco de Public Service Enterprise Groupleche materna entre las tomas es normal.  Utilice lanolina sobre los pezones luego de Museum/gallery exhibitions officeramamantar. La lanolina ayuda a mantener la humedad normal de la piel. Si Botswanausa lanolina pura, no tiene que lavarse los pezones antes de volver a Corporate treasureralimentar al beb. La lanolina pura no es txica para el beb. Adems, puede extraer Beazer Homesmanualmente algunas gotas de Parlierleche materna y Engineer, maintenance (IT)masajear suavemente esa Winn-Dixieleche sobre los pezones, para que la Pukwanaleche se seque al aire. Durante las primeras semanas despus de dar a luz, algunas mujeres pueden experimentar hinchazn en las mamas (congestin Great Meadowsmamaria). La congestin puede hacer que sienta las mamas pesadas, calientes y sensibles al tacto. El pico de la congestin ocurre dentro de los 3 a 5 das despus del Swinkparto. Las siguientes recomendaciones pueden ayudarla a Paramedicaliviar la congestin:  Vace por completo las mamas al QUALCOMMamamantar o Environmental health practitionerextraer leche. Puede aplicar calor hmedo en las mamas (en la ducha o con toallas hmedas para manos) antes de Museum/gallery exhibitions officeramamantar o extraer WPS Resourcesleche. Esto aumenta la circulacin y Saint Vincent and the Grenadinesayuda a que la Providenceleche fluya. Si el beb no vaca por completo las [address] D[address]amas cuando lo [address] Aveamamanta,  extraiga la Bennettleche restante despus de que haya finalizado.  Use un sostn ajustado (para amamantar o comn) o una camiseta sin mangas durante 1 o 2 das para indicar al cuerpo que disminuya ligeramente la produccin de Level Park-Oak Parkleche.  Aplique compresas de hielo Yahoo! Incsobre las mamas, a menos que le resulte demasiado incmodo.  Asegrese de que el beb est prendido y se encuentre en la posicin correcta mientras lo alimenta. Si la congestin persiste luego de 48 horas o despus de seguir estas recomendaciones, comunquese con su mdico o un Holiday representativeasesor en lactancia. RECOMENDACIONES GENERALES PARA EL CUIDADO DE LA SALUD DURANTE LA LACTANCIA MATERNA  Consuma alimentos saludables. Alterne comidas y colaciones, y coma 3 de cada una por da. Dado que lo que come Danaher Corporationafecta la leche materna, es posible que algunas comidas hagan que su beb se vuelva ms irritable de lo habitual. Evite comer este tipo de alimentos si percibe que afectan de manera negativa al beb.  Beba leche, jugos de fruta y agua para Patent examinersatisfacer su sed (aproximadamente 10 vasos al Futures traderda).  Descanse con frecuencia, reljese y tome sus vitaminas prenatales para evitar la fatiga, el estrs y la anemia.  Contine con los autocontroles de la mama.  Evite Product managermasticar y fumar tabaco. Las sustancias qumicas de los cigarrillos que pasan a la leche materna y la exposicin al humo ambiental del tabaco pueden daar al beb.  No consuma alcohol ni drogas, incluida la marihuana. Algunos medicamentos, que pueden ser perjudiciales para el beb, pueden pasar a travs de la Colgate Palmoliveleche materna. Es importante  que consulte a su mdico antes de Medical sales representativetomar cualquier medicamento, incluidos todos los medicamentos recetados y de Milanventa libre, as como los suplementos vitamnicos y herbales. Puede quedar embarazada durante la lactancia. Si desea controlar la natalidad, consulte a su mdico cules son las opciones ms seguras para el beb. SOLICITE ATENCIN MDICA SI:   Usted siente que quiere  dejar de Museum/gallery exhibitions officeramamantar o se siente frustrada con la lactancia.  Siente dolor en las mamas o en los pezones.  Sus pezones estn agrietados o Water quality scientistsangran.  Sus pechos estn irritados, sensibles o calientes.  Tiene un rea hinchada en cualquiera de las mamas.  Siente escalofros o fiebre.  Tiene nuseas o vmitos.  Presenta una secrecin de otro lquido distinto de la leche materna de los pezones.  Sus mamas no se llenan antes de Museum/gallery exhibitions officeramamantar al beb para el quinto da despus del Geneseoparto.  Se siente triste y deprimida.  El beb est demasiado somnoliento como para comer bien.  El beb tiene problemas para dormir.  Moja menos de 3 paales en 24 horas.  Defeca menos de 3 veces en 24 horas.  La piel del beb o la parte blanca de los ojos se vuelven amarillentas.  El beb no ha aumentado de Kerrpeso a los [address] das de [place]ida. SOLICITE ATENCIN MDICA DE INMEDIATO SI:   El beb est muy cansado Retail buyer(letargo) y no se quiere despertar para comer.  Le sube la fiebre sin causa.   Esta informacin no tiene Theme park managercomo fin reemplazar el consejo del mdico. Asegrese de hacerle al mdico cualquier pregunta que tenga.   Document Released: 09/15/2005 Document Revised: 06/06/2015 Elsevier Interactive Patient Education Yahoo[employer] Inc2016 Elsevier Inc.

## 2015-12-05 NOTE — Progress Notes (Signed)
Subjective:  Michelle Cross is a 35 y.o. [phone] at 6911w4d being seen today for ongoing prenatal care.  She is currently monitored for the following issues for this low-risk pregnancy and has History of gestational diabetes in prior pregnancy, currently pregnant; History of preterm delivery, currently pregnant; and Supervision of high risk pregnancy, antepartum on her problem list.  Patient reports vaginal irritation.  Contractions: Not present. Vag. Bleeding: None.  Movement: Present. Denies leaking of fluid.   The following portions of the patient's history were reviewed and updated as appropriate: allergies, current medications, past family history, past medical history, past social history, past surgical history and problem list. Problem list updated.  Objective:   Filed Vitals:   12/05/15 0829  BP: 99/67  Pulse: 94  Weight: 152 lb (68.947 kg)    Fetal Status: Fetal Heart Rate (bpm): 162 Fundal Height: 21 cm Movement: Present     General:  Alert, oriented and cooperative. Patient is in no acute distress.  Skin: Skin is warm and dry. No rash noted.   Cardiovascular: Normal heart rate noted  Respiratory: Normal respiratory effort, no problems with respiration noted  Abdomen: Soft, gravid, appropriate for gestational age. Pain/Pressure: Present     Pelvic: Vag. Bleeding: None Vag D/C Character: White   Cervical exam deferred        Extremities: Normal range of motion.  Edema: None  Mental Status: Normal mood and affect. Normal behavior. Normal judgment and thought content.   Urinalysis: Urine Protein: Negative Urine Glucose: Negative  Assessment and Plan:  Pregnancy: [id number] at 3211w4d  1. Supervision of high risk pregnancy, antepartum, second trimester Continue routine prenatal care.  2. History of preterm delivery, currently pregnant, second trimester Declines 17P  3. Vaginal discharge - Wet prep, genital General obstetric precautions including fetal movement were  reviewed in detail with the patient. Please refer to After Visit Summary for other counseling recommendations.  Return in 4 weeks (on 01/02/2016).   Reva Boresanya S Pratt, MD

## 2015-12-05 NOTE — Progress Notes (Signed)
Has noticed a thick white discharge with itching.

## 2015-12-06 ENCOUNTER — Telehealth: Payer: Self-pay | Admitting: *Deleted

## 2015-12-06 DIAGNOSIS — N760 Acute vaginitis: Principal | ICD-10-CM

## 2015-12-06 DIAGNOSIS — B9689 Other specified bacterial agents as the cause of diseases classified elsewhere: Secondary | ICD-10-CM

## 2015-12-06 LAB — WET PREP, GENITAL
Trich, Wet Prep: NONE SEEN
YEAST WET PREP: NONE SEEN

## 2015-12-06 MED ORDER — METRONIDAZOLE 500 MG PO TABS: 500.0000 mg | ORAL_TABLET | Freq: Two times a day (BID) | ORAL | Status: DC

## 2015-12-06 NOTE — Telephone Encounter (Signed)
-----   Message from Reva Boresanya S Pratt, MD sent at 12/06/2015  9:27 AM EST ----- Pt. With BV - Rx for Flagyl 500 mg po bid #14 no RF

## 2015-12-06 NOTE — Telephone Encounter (Signed)
LM on voicemail of positive BV and RX was sent to her pharmacy.  Encouraged pt to call if she has any questions.

## 2016-01-02 ENCOUNTER — Ambulatory Visit (INDEPENDENT_AMBULATORY_CARE_PROVIDER_SITE_OTHER): Payer: Self-pay | Admitting: Certified Nurse Midwife

## 2016-01-02 VITALS — BP 101/65 | HR 83 | Wt 158.0 lb

## 2016-01-02 DIAGNOSIS — O0992 Supervision of high risk pregnancy, unspecified, second trimester: Secondary | ICD-10-CM

## 2016-01-02 NOTE — Patient Instructions (Signed)
Prueba de tolerancia a la glucosa durante el embarazo (Glucose Tolerance Test During Pregnancy) La prueba de tolerancia a la glucosa es un anlisis de sangre que se usa para determinar si ha contrado un tipo de diabetes durante el embarazo (diabetes gestacional). Esto es cuando el cuerpo no procesa de forma correcta el azcar (glucosa) en los alimentos que come, lo cual provoca niveles sanguneos altos de glucosa. Por lo general, la PTG se realiza despus de haberse hecho una prueba de glucosa de 1 hora, cuyos resultados indican que posiblemente tiene diabetes gestacional. Tambin se puede hacer si:  Tiene antecedentes de haber parido bebs muy grandes o antecedentes de muerte fetal repetida (beb nacido muerto).  Tiene signos o sntomas de diabetes, tales como:  Cambios en la visin.  Hormigueo o adormecimiento en las manos o los pies.  Cambios en el hambre, la sed y la miccin que no se explican por el embarazo. La PTG dura unas 3 horas. Le darn para beber una solucin de agua y azcar al principio de la prueba. Le extraern sangre antes de que beba la solucin, y 1, 2 y 3 horas despus de beberla. No se le permitir comer ni beber nada ms durante la prueba. Debe permanecer en el lugar en que se realiza la prueba para asegurarse de que la sangre se extraiga puntualmente. Tambin debe evitar realizar ejercicios durante la prueba porque esto puede alterar los resultados. PREPARACIN PARA EL ESTUDIO  Coma normalmente durante 3 das antes de la PTG e incluya muchos alimentos ricos en carbohidratos. No coma ni beba nada, excepto agua, durante las ltimas 12 horas antes de la prueba. Adems, el mdico puede pedirle que deje de tomar ciertos medicamentos antes de la prueba. RESULTADOS  Es su responsabilidad retirar el resultado del estudio. Consulte en el laboratorio o en el departamento en el que fue realizado el estudio cundo y cmo podr obtener los resultados. Comunquese con el mdico si tiene  preguntas sobre los resultados.  Rango de valores normales Los rangos para los valores normales pueden variar entre diferentes laboratorios y hospitales. Siempre debe consultar a su mdico despus de realizarse un anlisis u otros estudios para saber si los valores de sus resultados se consideran dentro de los lmites normales. Los niveles normales de glucemia son los siguientes:  Ayuno: menos de 105mg/dl.  Una hora despus de beber la solucin: menos de 190mg/dl.  Dos horas despus de beber la solucin: menos de 165mg/dl.  Tres horas despus de beber la solucin: menos de 145mg/dl. Algunas sustancias pueden alterar los resultados de la PTG. Estas pueden incluir lo siguiente:  Medicamentos para la presin arterial y la insuficiencia cardaca, como betabloqueantes, furosemida y tiacidas.  Antiinflamatorios, como aspirina.  Nicotina.  Algunos medicamentos psiquitricos. Significado de los resultados que estn fuera de los rangos de los valores normales Los resultados de la PTG por debajo de los valores normales pueden indicar varios problemas de salud, tales como:   Diabetes gestacional.  Respuesta al estrs agudo.  Sndrome de Cushing.  Tumores como feocromocitoma o glucagonoma.  Problemas renales de larga duracin.  Pancreatitis.  Hipertiroidismo.  Infeccin actual. Hable con el mdico sobre los resultados. El mdico utilizar los resultados para realizar un diagnstico y determinar un plan de tratamiento adecuado para usted.   Esta informacin no tiene como fin reemplazar el consejo del mdico. Asegrese de hacerle al mdico cualquier pregunta que tenga.   Document Released: 03/16/2012 Document Revised: 10/06/2014 Elsevier Interactive Patient Education 2016 [employer]  

## 2016-01-02 NOTE — Progress Notes (Signed)
Subjective:  Desma Maximvannia Fernandez-Sanchez is a 35 y.o. [phone] at 8356w4d being seen today for ongoing prenatal care.  She is currently monitored for the following issues for this low-risk pregnancy and has History of gestational diabetes in prior pregnancy, currently pregnant; History of preterm delivery, currently pregnant; and Supervision of high risk pregnancy, antepartum on her problem list.  Patient reports round ligament pain.  Contractions: Not present. Vag. Bleeding: None.  Movement: Present. Denies leaking of fluid.   The following portions of the patient's history were reviewed and updated as appropriate: allergies, current medications, past family history, past medical history, past social history, past surgical history and problem list. Problem list updated.  Objective:   Filed Vitals:   01/02/16 1326  BP: 101/65  Pulse: 83  Weight: 158 lb (71.668 kg)    Fetal Status: Fetal Heart Rate (bpm): 154   Movement: Present     General:  Alert, oriented and cooperative. Patient is in no acute distress.  Skin: Skin is warm and dry. No rash noted.   Cardiovascular: Normal heart rate noted  Respiratory: Normal respiratory effort, no problems with respiration noted  Abdomen: Soft, gravid, appropriate for gestational age. Pain/Pressure: Present     Pelvic: Vag. Bleeding: None Vag D/C Character: White   Cervical exam deferred        Extremities: Normal range of motion.  Edema: Trace  Mental Status: Normal mood and affect. Normal behavior. Normal judgment and thought content.   Urinalysis: Urine Protein: Negative Urine Glucose: Negative  Assessment and Plan:  Pregnancy: [id number] at 2056w4d  1. Supervision of high risk pregnancy, antepartum, second trimester   Preterm labor symptoms and general obstetric precautions including but not limited to vaginal bleeding, contractions, leaking of fluid and fetal movement were reviewed in detail with the patient. Please refer to After Visit Summary for  other counseling recommendations.  Return in about 3 weeks (around 01/23/2016) for 1 hour gtt.   Rhea PinkLori A Clemmons, CNM

## 2016-01-23 ENCOUNTER — Ambulatory Visit (INDEPENDENT_AMBULATORY_CARE_PROVIDER_SITE_OTHER): Payer: Self-pay | Admitting: Obstetrics & Gynecology

## 2016-01-23 VITALS — BP 104/68 | HR 92 | Wt 157.0 lb

## 2016-01-23 DIAGNOSIS — Z36 Encounter for antenatal screening of mother: Secondary | ICD-10-CM

## 2016-01-23 DIAGNOSIS — O0993 Supervision of high risk pregnancy, unspecified, third trimester: Secondary | ICD-10-CM

## 2016-01-23 DIAGNOSIS — Z23 Encounter for immunization: Secondary | ICD-10-CM

## 2016-01-23 DIAGNOSIS — O09893 Supervision of other high risk pregnancies, third trimester: Secondary | ICD-10-CM

## 2016-01-23 DIAGNOSIS — O09213 Supervision of pregnancy with history of pre-term labor, third trimester: Secondary | ICD-10-CM

## 2016-01-23 DIAGNOSIS — O09293 Supervision of pregnancy with other poor reproductive or obstetric history, third trimester: Secondary | ICD-10-CM

## 2016-01-23 DIAGNOSIS — Z8632 Personal history of gestational diabetes: Principal | ICD-10-CM

## 2016-01-23 LAB — CBC
HEMATOCRIT: 37.1 % (ref 35.0–45.0)
Hemoglobin: 12.4 g/dL (ref 11.7–15.5)
MCH: 30.7 pg (ref 27.0–33.0)
MCHC: 33.4 g/dL (ref 32.0–36.0)
MCV: 91.8 fL (ref 80.0–100.0)
MPV: 9.1 fL (ref 7.5–12.5)
PLATELETS: 160 10*3/uL (ref 140–400)
RBC: 4.04 MIL/uL (ref 3.80–5.10)
RDW: 13.7 % (ref 11.0–15.0)
WBC: 10.5 10*3/uL (ref 3.8–10.8)

## 2016-01-23 NOTE — Progress Notes (Signed)
Subjective:  Michelle Cross is a 35 y.o. [phone] at 1683w4d being seen today for ongoing prenatal care.  She is currently monitored for the following issues for this high-risk pregnancy and has History of gestational diabetes in prior pregnancy, currently pregnant; History of preterm delivery, currently pregnant; and Supervision of high risk pregnancy, antepartum on her problem list.  Patient reports no complaints except for Braxton Hicks contractions.  Contractions: Not present. Vag. Bleeding: None.  Movement: Present. Denies leaking of fluid.   The following portions of the patient's history were reviewed and updated as appropriate: allergies, current medications, past family history, past medical history, past social history, past surgical history and problem list. Problem list updated.  Objective:   Filed Vitals:   01/23/16 0845  BP: 104/68  Pulse: 92  Weight: 157 lb (71.215 kg)    Fetal Status: Fetal Heart Rate (bpm): 154 Fundal Height: 28 cm Movement: Present     General:  Alert, oriented and cooperative. Patient is in no acute distress.  Skin: Skin is warm and dry. No rash noted.   Cardiovascular: Normal heart rate noted  Respiratory: Normal respiratory effort, no problems with respiration noted  Abdomen: Soft, gravid, appropriate for gestational age. Pain/Pressure: Present     Pelvic: Vag. Bleeding: None Vag D/C Character: Thin   Cervical exam performed       cl/th/high/posterior  Extremities: Normal range of motion.  Edema: Trace  Mental Status: Normal mood and affect. Normal behavior. Normal judgment and thought content.   Urinalysis: Urine Protein: Negative Urine Glucose: Negative  Assessment and Plan:  Pregnancy: [id number] at 783w4d  1. History of gestational diabetes in prior pregnancy, currently pregnant, third trimester   2. History of preterm delivery, currently pregnant, third trimester   3. Supervision of high risk pregnancy, antepartum, third  trimester  - CBC - HIV antibody - RPR - Glucose Tolerance, 1 HR (50g)  Preterm labor symptoms and general obstetric precautions including but not limited to vaginal bleeding, contractions, leaking of fluid and fetal movement were reviewed in detail with the patient. Please refer to After Visit Summary for other counseling recommendations.  Return in about 2 weeks (around 02/06/2016).   Allie BossierMyra C Madai Nuccio, MD

## 2016-01-24 LAB — HIV ANTIBODY (ROUTINE TESTING W REFLEX): HIV 1&2 Ab, 4th Generation: NONREACTIVE

## 2016-01-24 LAB — GLUCOSE TOLERANCE, 1 HOUR (50G) W/O FASTING: GLUCOSE, 1 HR, GESTATIONAL: 192 mg/dL — AB (ref <140)

## 2016-01-24 LAB — RPR

## 2016-01-28 ENCOUNTER — Encounter: Payer: Self-pay | Admitting: Obstetrics & Gynecology

## 2016-01-28 DIAGNOSIS — O24419 Gestational diabetes mellitus in pregnancy, unspecified control: Secondary | ICD-10-CM | POA: Insufficient documentation

## 2016-01-29 ENCOUNTER — Telehealth: Payer: Self-pay | Admitting: *Deleted

## 2016-01-29 DIAGNOSIS — O24410 Gestational diabetes mellitus in pregnancy, diet controlled: Secondary | ICD-10-CM

## 2016-01-29 NOTE — Telephone Encounter (Signed)
Referral sent to [facility], will make an appt.

## 2016-01-29 NOTE — Telephone Encounter (Signed)
-----   Message from Granville LewisLora L Clark, RN sent at 01/28/2016  4:46 PM EDT ----- Dyke BrackettHello chicks, Myra sent this to be by mistake.  [facility]  Thanks ----- Message -----    From: Allie BossierMyra C Dove, MD    Sent: 01/28/2016   4:33 PM      To: Granville LewisLora L Clark, RN  She will need a dietician, glucometer, strips, etc. Thanks

## 2016-01-30 ENCOUNTER — Encounter: Payer: Self-pay | Admitting: *Deleted

## 2016-01-30 ENCOUNTER — Encounter: Payer: Self-pay | Attending: Obstetrics & Gynecology | Admitting: *Deleted

## 2016-01-30 VITALS — Ht 60.75 in | Wt 155.9 lb

## 2016-01-30 DIAGNOSIS — R7309 Other abnormal glucose: Secondary | ICD-10-CM

## 2016-01-30 DIAGNOSIS — O24419 Gestational diabetes mellitus in pregnancy, unspecified control: Secondary | ICD-10-CM | POA: Insufficient documentation

## 2016-02-06 ENCOUNTER — Encounter: Payer: Self-pay | Admitting: Obstetrics & Gynecology

## 2016-02-07 ENCOUNTER — Ambulatory Visit (INDEPENDENT_AMBULATORY_CARE_PROVIDER_SITE_OTHER): Payer: Self-pay | Admitting: Family Medicine

## 2016-02-07 VITALS — BP 102/64 | HR 96 | Wt 155.0 lb

## 2016-02-07 DIAGNOSIS — O24410 Gestational diabetes mellitus in pregnancy, diet controlled: Secondary | ICD-10-CM

## 2016-02-07 DIAGNOSIS — O0993 Supervision of high risk pregnancy, unspecified, third trimester: Secondary | ICD-10-CM

## 2016-02-07 MED ORDER — GLYBURIDE 2.5 MG PO TABS: 2.5000 mg | ORAL_TABLET | Freq: Every day | ORAL | Status: DC

## 2016-02-07 NOTE — Progress Notes (Signed)
FBS 116-130 2 hour pp 92-145

## 2016-02-07 NOTE — Patient Instructions (Signed)
Diabetes mellitus gestacional (Gestational Diabetes Mellitus) La diabetes mellitus gestacional, ms comnmente conocida como diabetes gestacional es un tipo de diabetes que desarrollan algunas mujeres durante el embarazo. En la diabetes gestacional, el pncreas no produce suficiente insulina (una hormona) o las clulas son menos sensibles a la insulina producida (resistencia a la insulina), o ambas cosas. Normalmente, la insulina mueve los azcares de los alimentos a las clulas de los tejidos. Las clulas de los tejidos utilizan los azcares para obtener energa. La falta de insulina o la falta de una respuesta normal a la insulina hace que el exceso de azcar se acumule en la sangre en lugar de penetrar en las clulas de los tejidos. Como resultado, se producen niveles altos de azcar en la sangre (hiperglucemia). El efecto de los niveles altos de azcar (glucosa) puede causar muchos problemas.  FACTORES DE RIESGO Usted tiene mayor probabilidad de desarrollar diabetes gestacional si tiene antecedentes familiares de diabetes y tambin si tiene uno o ms de los siguientes factores de riesgo:  ndice de masa corporal superior a 30 (obesidad).  Embarazo previo con diabetes gestacional.  La edad avanzada en el momento del embarazo. Si se mantienen los niveles de glucosa en la sangre en un rango normal durante el embarazo, las mujeres pueden tener un embarazo saludable. Si los niveles de glucosa en la sangre no estn bien controlados, puede haber riesgos para usted, el feto o el recin nacido, o durante el trabajo de parto y el parto.  SNTOMAS  Si se presentan sntomas, stos son similares a los sntomas que normalmente experimentar durante el embarazo. Los sntomas de la diabetes gestacional son:   Aumento de la sed (polidipsia).  Aumento de la miccin (poliuria).  Orina con ms frecuencia durante la noche (nocturia).  Prdida de peso. La prdida de peso puede ser muy rpida.  Infecciones  frecuentes y recurrentes.  Cansancio (fatiga).  Debilidad.  Cambios en la visin, como visin borrosa.  Olor a fruta en el aliento.  Dolor abdominal. DIAGNSTICO La diabetes se diagnostica cuando hay aumento de los niveles de glucosa en la sangre. El nivel de glucosa en la sangre puede controlarse en uno o ms de los siguientes anlisis de sangre:  Medicin de glucosa en la sangre en ayunas. No se le permitir comer durante al menos 8 horas antes de que se tome una muestra de sangre.  Pruebas al azar de glucosa en la sangre. El nivel de glucosa en la sangre se controla en cualquier momento del da sin importar el momento en que haya comido.  Prueba de tolerancia a la glucosa oral (PTGO). La glucosa en la sangre se mide despus de no haber comido (ayunas) durante una a tres horas y despus de beber una bebida que contenga glucosa. Dado que las hormonas que causan la resistencia a la insulina son ms altas alrededor de las semanas 24 a 28 de embarazo, generalmente se realiza una PTGO durante ese tiempo. Si tiene factores de riesgo, en la primera visita prenatal pueden hacerle pruebas de deteccin de diabetes tipo 2 no diagnosticada. TRATAMIENTO  La diabetes gestacional debe controlarse en primer lugar con dieta y ejercicios. Pueden agregarse medicamentos, pero solo si son necesarios.  Usted tendr que tomar medicamentos para la diabetes o insulina diariamente para mantener los niveles de glucosa en la sangre en el rango deseado.  Usted tendr que combinar la dosis de insulina con la actividad fsica y la eleccin de alimentos saludables. Si tiene diabetes gestacional, el objetivo del tratamiento ser   mantener los siguientes niveles sanguneos de glucosa:  Antes de las comidas (preprandial): valor de 95 mg/dl o inferior.  Despus de las comidas (posprandial):  Una hora despus de la comida: valor de 140 mg/dl o inferior.  Dos horas despus de la comida: valor de 120 mg/dl o  inferior. Si tiene diabetes tipo 1 o tipo 2 preexistente, el objetivo del tratamiento ser mantener los siguientes niveles sanguneos de glucosa:  Antes de las comidas, a la hora de acostarse y durante la noche: de 60 a 99 mg/dl.  Despus de las comidas: valor mximo de 100 a 129 mg/dl. INSTRUCCIONES PARA EL CUIDADO EN EL HOGAR   Controle su nivel de hemoglobina A1c dos veces al ao.  Contrlese a diario el nivel de glucosa en la sangre segn las indicaciones de su mdico. Es comn realizar controles frecuentes de la glucosa en la sangre.  Supervise las cetonas en la orina cuando est enferma y segn las indicaciones de su mdico.  Tome el medicamento para la diabetes y adminstrese insulina segn las indicaciones de su mdico para mantener el nivel de glucosa en la sangre en el rango deseado.  Nunca se quede sin medicamento para la diabetes o sin insulina. Es necesario que la reciba todos los das.  Ajuste la insulina segn la ingesta de hidratos de carbono. Los hidratos de carbono pueden aumentar los niveles de glucosa en la sangre, pero deben incluirse en su dieta. Los hidratos de carbono aportan vitaminas, minerales y fibra que son una parte esencial de una dieta saludable. Los hidratos de carbono se encuentran en frutas, verduras, cereales integrales, productos lcteos, legumbres y alimentos que contienen azcares aadidos.  Consuma alimentos saludables. Alterne 3 comidas con 3 colaciones.  Aumente de peso saludablemente. El aumento del peso total vara de acuerdo con el ndice de masa corporal que tena antes del embarazo ([facility]).  Lleve una tarjeta de alerta mdica o use una pulsera o medalla de alerta mdica.  Lleve con usted una colacin de 15gramos de hidratos de carbono en todo momento para controlar los niveles bajos de glucosa en la sangre (hipoglucemia). Algunos ejemplos de colaciones de 15gramos de hidratos de carbono son los siguientes:  Tabletas de glucosa, 3 o 4.  Gel  de glucosa, tubo de 15 gramos.  Pasas de uva, 2 cucharadas (24 g).  Caramelos de goma, 6.  Galletas de animales, 8.  Jugo de fruta, gaseosa comn, o leche descremada, 4 onzas (120 ml).  Pastillas de goma, 9.  Reconocer la hipoglucemia. Durante el embarazo la hipoglucemia se produce cuando hay niveles de glucosa en la sangre de 60 mg/dl o menos. El riesgo de hipoglucemia aumenta durante el ayuno o cuando se saltea las comidas, durante o despus de realizar ejercicio intenso y mientras duerme. Los sntomas de hipoglucemia son:  Temblores o sacudidas.  Disminucin de la capacidad de concentracin.  Sudoracin.  Aumento de la frecuencia cardaca.  Dolor de cabeza.  Sequedad en la boca.  Hambre.  Irritabilidad.  Ansiedad.  Sueo agitado.  Alteracin del habla o de la coordinacin.  Confusin.  Tratar la hipoglucemia rpidamente. Si usted est alerta y puede tragar con seguridad, siga la regla de 15/15 que consiste en:  Tome entre 15 y 20gramos de glucosa de accin rpida o carbohidratos. Las opciones de accin rpida son un gel de glucosa, tabletas de glucosa, o 4 onzas (120 ml) de jugo de frutas, gaseosa comn, o leche baja en grasa.  Compruebe su nivel de glucosa en la sangre   15 minutos despus de tomar la glucosa.  Tome entre 15 y 20 gramos ms de glucosa si el nivel de glucosa en la sangre todava es de 70mg/dl o inferior.  Ingiera una comida o una colacin en el lapso de 1 hora una vez que los niveles de glucosa en la sangre vuelven a la normalidad.  Est atento a la poliuria (miccin excesiva) y la polidipsia (sensacin de mucha sed), que son los primeros signos de la hiperglucemia. El reconocimiento temprano de la hiperglucemia permite un tratamiento oportuno. Trate la hiperglucemia segn le indic su mdico.  Haga actividad fsica por lo menos 30minutos al da o como lo indique su mdico. Se recomienda que 30 minutos despus de cada comida, realice diez minutos  de actividad fsica para controlar los niveles de glucosa postprandial en la sangre.  Ajuste su dosis de insulina y la ingesta de alimentos, segn sea necesario, si inicia un nuevo ejercicio o deporte.  Siga su plan para los das de enfermedad cuando no pueda comer o beber como de costumbre.  Evite el tabaco y el alcohol.  Concurra a todas las visitas de control como se lo haya indicado el mdico.  Siga el consejo del mdico respecto a los controles prenatales y posteriores al parto (postparto), las visitas, la planificacin de las comidas, el ejercicio, los medicamentos, las vitaminas, los anlisis de sangre, otras pruebas mdicas y actividades fsicas.  Realice diariamente el cuidado de la piel y de los pies. Examine su piel y los pies diariamente para ver si tiene cortes, moretones, enrojecimiento, problemas en las uas, sangrado, ampollas o llagas.  Cepllese los dientes y encas por lo menos dos veces al da y use hilo dental al menos una vez por da. Concurra regularmente a las visitas de control con el dentista.  Programe un examen de vista durante el primer trimestre de su embarazo o como lo indique su mdico.  Comparta su plan de control de diabetes en el trabajo o en la escuela.  Mantngase al da con las vacunas.  Aprenda a manejar el estrs.  Obtenga la mayor cantidad posible de informacin sobre la diabetes y solicite ayuda siempre que sea necesario.  Obtenga informacin sobre el amamantamiento y analice esta posibilidad.  Debe controlar el nivel de azcar en la sangre de 6a 12semanas despus del parto. Esto se hace con una prueba de tolerancia a la glucosa oral (PTGO). SOLICITE ATENCIN MDICA SI:   No puede comer alimentos o beber por ms de 6 horas.  Tuvo nuseas o ha vomitado durante ms de 6 horas.  Tiene un nivel de glucosa en la sangre de 200 mg/dl y cetonas en la orina.  Presenta algn cambio en el estado mental.  Desarrolla problemas de visin.  Sufre  un dolor persistente de cabeza.  Siente dolor o molestias en la parte superior del abdomen.  Desarrolla una enfermedad grave adicional.  Tuvo diarrea durante ms de 6 horas.  Ha estado enfermo o ha tenido fiebre durante un par de das y no mejora. SOLICITE ATENCIN MDICA DE INMEDIATO SI:   Tiene dificultad para respirar.  Ya no siente los movimientos del beb.  Est sangrando o tiene flujo vaginal.  Comienza a tener contracciones o trabajo de parto prematuro. ASEGRESE DE QUE:  Comprende estas instrucciones.  Controlar su afeccin.  Recibir ayuda de inmediato si no mejora o si empeora.   Esta informacin no tiene como fin reemplazar el consejo del mdico. Asegrese de hacerle al mdico cualquier pregunta que tenga.     Document Released: 06/25/2005 Document Revised: 10/06/2014 Elsevier Interactive Patient Education 2016 [employer]  Lactancia materna (Breastfeeding) Decidir amamantar es una de las mejores elecciones que puede hacer por usted y su beb. El cambio hormonal durante el embarazo produce el desarrollo del tejido mamario y aumenta la cantidad y el tamao de los conductos galactforos. Estas hormonas tambin permiten que las protenas, los azcares y las grasas de la sangre produzcan la leche materna en las glndulas productoras de leche. Las hormonas impiden que la leche materna sea liberada antes del nacimiento del beb, adems de impulsar el flujo de leche luego del nacimiento. Una vez que ha comenzado a amamantar, pensar en el beb, as como la succin o el llanto, pueden estimular la liberacin de leche de las glndulas productoras de leche.  LOS BENEFICIOS DE AMAMANTAR Para el beb  La primera leche (calostro) ayuda a mejorar el funcionamiento del sistema digestivo del beb.  La leche tiene anticuerpos que ayudan a prevenir las infecciones en el beb.  El beb tiene una menor incidencia de asma, alergias y del sndrome de muerte sbita del lactante.  Los  nutrientes en la leche materna son mejores para el beb que la leche maternizada y estn preparados exclusivamente para cubrir las necesidades del beb.  La leche materna mejora el desarrollo cerebral del beb.  Es menos probable que el beb desarrolle otras enfermedades, como obesidad infantil, asma o diabetes mellitus de tipo 2. Para usted   La lactancia materna favorece el desarrollo de un vnculo muy especial entre la madre y el beb.  Es conveniente. La leche materna siempre est disponible a la temperatura correcta y es econmica.  La lactancia materna ayuda a quemar caloras y a perder el peso ganado durante el embarazo.  Favorece la contraccin del tero al tamao que tena antes del embarazo de manera ms rpida y disminuye el sangrado (loquios) despus del parto.  La lactancia materna contribuye a reducir el riesgo de desarrollar diabetes mellitus de tipo 2, osteoporosis o cncer de mama o de ovario en el futuro. SIGNOS DE QUE EL BEB EST HAMBRIENTO Primeros signos de hambre  Aumenta su estado de alerta o actividad.  Se estira.  Mueve la cabeza de un lado a otro.  Mueve la cabeza y abre la boca cuando se le toca la mejilla o la comisura de la boca (reflejo de bsqueda).  Aumenta las vocalizaciones, tales como sonidos de succin, se relame los labios, emite arrullos, suspiros, o chirridos.  Mueve la mano hacia la boca.  Se chupa con ganas los dedos o las manos. Signos tardos de hambre  Est agitado.  Llora de manera intermitente. Signos de hambre extrema Los signos de hambre extrema requerirn que lo calme y lo consuele antes de que el beb pueda alimentarse adecuadamente. No espere a que se manifiesten los siguientes signos de hambre extrema para comenzar a amamantar:   Agitacin.  Llanto intenso y fuerte.  Gritos. INFORMACIN BSICA SOBRE LA LACTANCIA MATERNA Iniciacin de la lactancia materna  Encuentre un lugar cmodo para sentarse o acostarse, con un  buen respaldo para el cuello y la espalda.  Coloque una almohada o una manta enrollada debajo del beb para acomodarlo a la altura de la mama (si est sentada). Las almohadas para amamantar se han diseado especialmente a fin de servir de apoyo para los brazos y el beb mientras amamanta.  Asegrese de que el abdomen del beb est frente al suyo.   Masajee suavemente la mama. Con las yemas   de los dedos, masajee la pared del pecho hacia el pezn en un movimiento circular. Esto estimula el flujo de leche. Es posible que deba continuar este movimiento mientras amamanta si la leche fluye lentamente.  Sostenga la mama con el pulgar por arriba del pezn y los otros 4 dedos por debajo de la mama. Asegrese de que los dedos se encuentren lejos del pezn y de la boca del beb.  Empuje suavemente los labios del beb con el pezn o con el dedo.  Cuando la boca del beb se abra lo suficiente, acrquelo rpidamente a la mama e introduzca todo el pezn y la zona oscura que lo rodea (areola), tanto como sea posible, dentro de la boca del beb.  Debe haber ms areola visible por arriba del labio superior del beb que por debajo del labio inferior.  La lengua del beb debe estar entre la enca inferior y la mama.  Asegrese de que la boca del beb est en la posicin correcta alrededor del pezn (prendida). Los labios del beb deben crear un sello sobre la mama y estar doblados hacia afuera (invertidos).  Es comn que el beb succione durante 2 a 3 minutos para que comience el flujo de leche materna. Cmo debe prenderse Es muy importante que le ensee al beb cmo prenderse adecuadamente a la mama. Si el beb no se prende adecuadamente, puede causarle dolor en el pezn y reducir la produccin de leche materna, y hacer que el beb tenga un escaso aumento de peso. Adems, si el beb no se prende adecuadamente al pezn, puede tragar aire durante la alimentacin. Esto puede causarle molestias al beb. Hacer eructar  al beb al cambiar de mama puede ayudarlo a liberar el aire. Sin embargo, ensearle al beb cmo prenderse a la mama adecuadamente es la mejor manera de evitar que se sienta molesto por tragar aire mientras se alimenta. Signos de que el beb se ha prendido adecuadamente al pezn:   Tironea o succiona de modo silencioso, sin causarle dolor.  Se escucha que traga cada 3 o 4 succiones.  Hay movimientos musculares por arriba y por delante de sus odos al succionar. Signos de que el beb no se ha prendido adecuadamente al pezn:   Hace ruidos de succin o de chasquido mientras se alimenta.  Siente dolor en el pezn. Si cree que el beb no se prendi correctamente, deslice el dedo en la comisura de la boca y colquelo entre las encas del beb para interrumpir la succin. Intente comenzar a amamantar nuevamente. Signos de lactancia materna exitosa Signos del beb:   Disminuye gradualmente el nmero de succiones o cesa la succin por completo.  Se duerme.  Relaja el cuerpo.  Retiene una pequea cantidad de leche en la boca.  Se desprende solo del pecho. Signos que presenta usted:  Las mamas han aumentado la firmeza, el peso y el tamao 1 a 3 horas despus de amamantar.  Estn ms blandas inmediatamente despus de amamantar.  Un aumento del volumen de leche, y tambin un cambio en su consistencia y color se producen hacia el quinto da de lactancia materna.  Los pezones no duelen, ni estn agrietados ni sangran. Signos de que su beb recibe la cantidad de leche suficiente  Moja al menos 3 paales en 24 horas. La orina debe ser clara y de color amarillo plido a los 5 das de vida.  Defeca al menos 3 veces en 24 horas a los 5 das de vida. La materia fecal debe ser   blanda y amarillenta.  Defeca al menos 3 veces en 24 horas a los 7 das de vida. La materia fecal debe ser grumosa y amarillenta.  No registra una prdida de peso mayor del 10% del peso al nacer durante los primeros 3  das de vida.  Aumenta de peso un promedio de 4 a 7onzas (113 a 198g) por semana despus de los 4 das de vida.  Aumenta de peso, diariamente, de manera uniforme a partir de los 5 das de vida, sin registrar prdida de peso despus de las 2semanas de vida. Despus de alimentarse, es posible que el beb regurgite una pequea cantidad. Esto es frecuente. FRECUENCIA Y DURACIN DE LA LACTANCIA MATERNA El amamantamiento frecuente la ayudar a producir ms leche y a prevenir problemas de dolor en los pezones e hinchazn en las mamas. Alimente al beb cuando muestre signos de hambre o si siente la necesidad de reducir la congestin de las mamas. Esto se denomina "lactancia a demanda". Evite el uso del chupete mientras trabaja para establecer la lactancia (las primeras 4 a 6 semanas despus del nacimiento del beb). Despus de este perodo, podr ofrecerle un chupete. Las investigaciones demostraron que el uso del chupete durante el primer ao de vida del beb disminuye el riesgo de desarrollar el sndrome de muerte sbita del lactante (SMSL). Permita que el nio se alimente en cada mama todo lo que desee. Contine amamantando al beb hasta que haya terminado de alimentarse. Cuando el beb se desprende o se queda dormido mientras se est alimentando de la primera mama, ofrzcale la segunda. Debido a que, con frecuencia, los recin nacidos permanecen somnolientos las primeras semanas de vida, es posible que deba despertar al beb para alimentarlo. Los horarios de lactancia varan de un beb a otro. Sin embargo, las siguientes reglas pueden servir como gua para ayudarla a garantizar que el beb se alimenta adecuadamente:  Se puede amamantar a los recin nacidos (bebs de 4 semanas o menos de vida) cada 1 a 3 horas.  No deben transcurrir ms de 3 horas durante el da o 5 horas durante la noche sin que se amamante a los recin nacidos.  Debe amamantar al beb 8 veces como mnimo en un perodo de 24 horas,  hasta que comience a introducir slidos en su dieta, a los 6 meses de vida aproximadamente. EXTRACCIN DE LECHE MATERNA La extraccin y el almacenamiento de la leche materna le permiten asegurarse de que el beb se alimente exclusivamente de leche materna, aun en momentos en los que no puede amamantar. Esto tiene especial importancia si debe regresar al trabajo en el perodo en que an est amamantando o si no puede estar presente en los momentos en que el beb debe alimentarse. Su asesor en lactancia puede orientarla sobre cunto tiempo es seguro almacenar leche materna.  El sacaleche es un aparato que le permite extraer leche de la mama a un recipiente estril. Luego, la leche materna extrada puede almacenarse en un refrigerador o congelador. Algunos sacaleches son manuales, mientras que otros son elctricos. Consulte a su asesor en lactancia qu tipo ser ms conveniente para usted. Los sacaleches se pueden comprar; sin embargo, algunos hospitales y grupos de apoyo a la lactancia materna alquilan sacaleches mensualmente. Un asesor en lactancia puede ensearle cmo extraer leche materna manualmente, en caso de que prefiera no usar un sacaleche.  CMO CUIDAR LAS MAMAS DURANTE LA LACTANCIA MATERNA Los pezones se secan, agrietan y duelen durante la lactancia materna. Las siguientes recomendaciones pueden ayudarla a   mantener las mamas humectadas y sanas:  Evite usar jabn en los pezones.  Use un sostn de soporte. Aunque no son esenciales, las camisetas sin mangas o los sostenes especiales para amamantar estn diseados para acceder fcilmente a las mamas, para amamantar sin tener que quitarse todo el sostn o la camiseta. Evite usar sostenes con aro o sostenes muy ajustados.  Seque al aire sus pezones durante 3 a 4minutos despus de amamantar al beb.  Utilice solo apsitos de algodn en el sostn para absorber las prdidas de leche. La prdida de un poco de leche materna entre las tomas es  normal.  Utilice lanolina sobre los pezones luego de amamantar. La lanolina ayuda a mantener la humedad normal de la piel. Si usa lanolina pura, no tiene que lavarse los pezones antes de volver a alimentar al beb. La lanolina pura no es txica para el beb. Adems, puede extraer manualmente algunas gotas de leche materna y masajear suavemente esa leche sobre los pezones, para que la leche se seque al aire. Durante las primeras semanas despus de dar a luz, algunas mujeres pueden experimentar hinchazn en las mamas (congestin mamaria). La congestin puede hacer que sienta las mamas pesadas, calientes y sensibles al tacto. El pico de la congestin ocurre dentro de los 3 a 5 das despus del parto. Las siguientes recomendaciones pueden ayudarla a aliviar la congestin:  Vace por completo las mamas al amamantar o extraer leche. Puede aplicar calor hmedo en las mamas (en la ducha o con toallas hmedas para manos) antes de amamantar o extraer leche. Esto aumenta la circulacin y ayuda a que la leche fluya. Si el beb no vaca por completo las mamas cuando lo amamanta, extraiga la leche restante despus de que haya finalizado.  Use un sostn ajustado (para amamantar o comn) o una camiseta sin mangas durante 1 o 2 das para indicar al cuerpo que disminuya ligeramente la produccin de leche.  Aplique compresas de hielo sobre las mamas, a menos que le resulte demasiado incmodo.  Asegrese de que el beb est prendido y se encuentre en la posicin correcta mientras lo alimenta. Si la congestin persiste luego de 48 horas o despus de seguir estas recomendaciones, comunquese con su mdico o un asesor en lactancia. RECOMENDACIONES GENERALES PARA EL CUIDADO DE LA SALUD DURANTE LA LACTANCIA MATERNA  Consuma alimentos saludables. Alterne comidas y colaciones, y coma 3 de cada una por da. Dado que lo que come afecta la leche materna, es posible que algunas comidas hagan que su beb se vuelva ms irritable de  lo habitual. Evite comer este tipo de alimentos si percibe que afectan de manera negativa al beb.  Beba leche, jugos de fruta y agua para satisfacer su sed (aproximadamente 10 vasos al da).  Descanse con frecuencia, reljese y tome sus vitaminas prenatales para evitar la fatiga, el estrs y la anemia.  Contine con los autocontroles de la mama.  Evite masticar y fumar tabaco. Las sustancias qumicas de los cigarrillos que pasan a la leche materna y la exposicin al humo ambiental del tabaco pueden daar al beb.  No consuma alcohol ni drogas, incluida la marihuana. Algunos medicamentos, que pueden ser perjudiciales para el beb, pueden pasar a travs de la leche materna. Es importante que consulte a su mdico antes de tomar cualquier medicamento, incluidos todos los medicamentos recetados y de venta libre, as como los suplementos vitamnicos y herbales. Puede quedar embarazada durante la lactancia. Si desea controlar la natalidad, consulte a su mdico cules son   las opciones ms seguras para el beb. SOLICITE ATENCIN MDICA SI:   Usted siente que quiere dejar de amamantar o se siente frustrada con la lactancia.  Siente dolor en las mamas o en los pezones.  Sus pezones estn agrietados o sangran.  Sus pechos estn irritados, sensibles o calientes.  Tiene un rea hinchada en cualquiera de las mamas.  Siente escalofros o fiebre.  Tiene nuseas o vmitos.  Presenta una secrecin de otro lquido distinto de la leche materna de los pezones.  Sus mamas no se llenan antes de amamantar al beb para el quinto da despus del parto.  Se siente triste y deprimida.  El beb est demasiado somnoliento como para comer bien.  El beb tiene problemas para dormir.  Moja menos de 3 paales en 24 horas.  Defeca menos de 3 veces en 24 horas.  La piel del beb o la parte blanca de los ojos se vuelven amarillentas.  El beb no ha aumentado de peso a los 5 das de vida. SOLICITE ATENCIN  MDICA DE INMEDIATO SI:   El beb est muy cansado (letargo) y no se quiere despertar para comer.  Le sube la fiebre sin causa.   Esta informacin no tiene como fin reemplazar el consejo del mdico. Asegrese de hacerle al mdico cualquier pregunta que tenga.   Document Released: 09/15/2005 Document Revised: 06/06/2015 Elsevier Interactive Patient Education 2016 Elsevier Inc.  

## 2016-02-07 NOTE — Progress Notes (Signed)
Subjective:  Michelle Cross is a 35 y.o. [phone] at 4932w5d being seen today for ongoing prenatal care.  She is currently monitored for the following issues for this high-risk pregnancy and has History of preterm delivery, currently pregnant; Supervision of high risk pregnancy, antepartum; and Gestational diabetes on her problem list.  Patient reports pelvic pressure.  Contractions: Not present. Vag. Bleeding: None.  Movement: Present. Denies leaking of fluid.   The following portions of the patient's history were reviewed and updated as appropriate: allergies, current medications, past family history, past medical history, past social history, past surgical history and problem list. Problem list updated.  Objective:   Filed Vitals:   02/07/16 0909  BP: 102/64  Pulse: 96  Weight: 155 lb (70.308 kg)    Fetal Status: Fetal Heart Rate (bpm): 154 Fundal Height: 29 cm Movement: Present     General:  Alert, oriented and cooperative. Patient is in no acute distress.  Skin: Skin is warm and dry. No rash noted.   Cardiovascular: Normal heart rate noted  Respiratory: Normal respiratory effort, no problems with respiration noted  Abdomen: Soft, gravid, appropriate for gestational age. Pain/Pressure: Present     Pelvic: Vag. Bleeding: None Vag D/C Character: Thin   Cervical exam deferred        Extremities: Normal range of motion.  Edema: Trace  Mental Status: Normal mood and affect. Normal behavior. Normal judgment and thought content.   Urinalysis: Urine Protein: Negative Urine Glucose: Negative FBS 116-130 2 hour pp 92-138 Assessment and Plan:  Pregnancy: [id number] at 6832w5d  1. Supervision of high risk pregnancy, antepartum, third trimester Continue prenatal care.   2. Diet controlled gestational diabetes mellitus in third trimester Begin glyburide at hs to get fasting BS in line--hopefully will improve pp--she is exercising. - glyBURIDE (DIABETA) 2.5 MG tablet; Take 1 tablet  (2.5 mg total) by mouth at bedtime.  Dispense: 30 tablet; Refill: 3  Preterm labor symptoms and general obstetric precautions including but not limited to vaginal bleeding, contractions, leaking of fluid and fetal movement were reviewed in detail with the patient. Please refer to After Visit Summary for other counseling recommendations.  Return in 2 weeks (on 02/21/2016) for OB visit and NST.   Reva Boresanya S Pratt, MD

## 2016-02-08 NOTE — Progress Notes (Signed)
  Patient was seen on 01/30/16 for Gestational Diabetes self-management visit at [facility]. Interpretor here to assist with Spanish speaking patient.The following learning objectives were met by the patient during this course:   States the definition of Gestational Diabetes  States why dietary management is important in controlling blood glucose  Describes the effects each nutrient has on blood glucose levels  Demonstrates ability to create a balanced meal plan  Demonstrates carbohydrate counting   States when to check blood glucose levels  Demonstrates proper blood glucose monitoring techniques  States the effect of stress and exercise on blood glucose levels  States the importance of limiting caffeine and abstaining from alcohol and smoking  Blood glucose monitor information given: for Rx for Accu Chek Aviva meter with Fast Clix Drum which is covered by Medicaid   Patient instructed to monitor glucose levels: FBS: 60 - <90 1 hour: <140 [facility]) 2 hour: <120  *Patient received handouts:  Nutrition Diabetes and Pregnancy  Carbohydrate Counting List  Patient will be seen for follow-up as needed.

## 2016-02-21 ENCOUNTER — Ambulatory Visit (INDEPENDENT_AMBULATORY_CARE_PROVIDER_SITE_OTHER): Payer: Self-pay | Admitting: Obstetrics & Gynecology

## 2016-02-21 VITALS — BP 93/64 | HR 87 | Wt 155.6 lb

## 2016-02-21 DIAGNOSIS — O24415 Gestational diabetes mellitus in pregnancy, controlled by oral hypoglycemic drugs: Secondary | ICD-10-CM

## 2016-02-21 DIAGNOSIS — O0993 Supervision of high risk pregnancy, unspecified, third trimester: Secondary | ICD-10-CM

## 2016-02-21 MED ORDER — GLYBURIDE 2.5 MG PO TABS: 3.7500 mg | ORAL_TABLET | Freq: Every day | ORAL | Status: DC

## 2016-02-21 NOTE — Progress Notes (Signed)
Subjective:  Michelle Cross is a 35 y.o. [phone] at 4545w5d being seen today for ongoing prenatal care.  She is currently monitored for the following issues for this high-risk pregnancy and has History of preterm delivery, currently pregnant; Supervision of high risk pregnancy, antepartum; and Gestational diabetes on her problem list.  Patient is Spanish-speaking only, Spanish interpreter present for this encounter.  Patient reports no complaints currently.  Contractions: Not present. Vag. Bleeding: None.  Movement: Present. Denies leaking of fluid.   The following portions of the patient's history were reviewed and updated as appropriate: allergies, current medications, past family history, past medical history, past social history, past surgical history and problem list. Problem list updated.  Objective:   Filed Vitals:   02/21/16 0903  BP: 93/64  Pulse: 87  Weight: 155 lb 9.6 oz (70.58 kg)    Fetal Status: Fetal Heart Rate (bpm): 154 Fundal Height: 33 cm Movement: Present     General:  Alert, oriented and cooperative. Patient is in no acute distress.  Skin: Skin is warm and dry. No rash noted.   Cardiovascular: Normal heart rate noted  Respiratory: Normal respiratory effort, no problems with respiration noted  Abdomen: Soft, gravid, appropriate for gestational age. Pain/Pressure: Present     Pelvic: Vag. Bleeding: None Vag D/C Character: White  Cervical exam deferred        Extremities: Normal range of motion.  Edema: Trace  Mental Status: Normal mood and affect. Normal behavior. Normal judgment and thought content.   Urinalysis: Urine Protein: Negative Urine Glucose: Negative CBGs: 3 fasting values >100, normal postprandials NST performed today was reviewed and was found to be reactive.  AFI was also normal.  Continue recommended antenatal testing and prenatal care.  Assessment and Plan:  Pregnancy: [id number] at 8745w5d  1. Gestational diabetes mellitus (GDM) in third  trimester controlled on oral hypoglycemic drug Increased Glyburide to 3.75 mg daily. Antenatal testing started today.   - glyBURIDE (DIABETA) 2.5 MG tablet; Take 1.5 tablets (3.75 mg total) by mouth at bedtime.  Dispense: 60 tablet; Refill: 3  2. Supervision of high risk pregnancy, antepartum, third trimester Preterm labor symptoms and general obstetric precautions including but not limited to vaginal bleeding, contractions, leaking of fluid and fetal movement were reviewed in detail with the patient. Please refer to After Visit Summary for other counseling recommendations.  Return in about 4 days (around 02/25/2016) for NST only . Then 02/28/16 OB visit and NST.Marland Kitchen.   Tereso NewcomerUgonna A Anyanwu, MD

## 2016-02-21 NOTE — Patient Instructions (Addendum)
Regrese a la clinica cuando tenga su cita. Si tiene problemas o preguntas, llama a la clinica o vaya a la [facility]a [facility] emergencia al [facility]   Eleccin del mtodo anticonceptivo (Contraception Choices) La anticoncepcin (control de la natalidad) es el uso de cualquier mtodo o dispositivo para Location managerevitar el embarazo. A continuacin se indican algunos de esos mtodos. MTODOS HORMONALES   El Implante contraconceptivo consiste en un tubo plstico delgado que contiene la hormona progesterona. No contiene estrgenos. El mdico inserta el tubo en la parte interna del brazo. El tubo puede Geneticist, molecularpermanecer en el lugar durante 3 aos. Despus de los 3 aos debe retirarse. El implante impide que los ovarios liberen vulos (ovulacin), espesa el moco cervical, lo que evita que los espermatozoides ingresen al tero y hace ms delgada la membrana que cubre el interior del tero.  Inyecciones de progesterona sola: las Insurance underwriteradministra el mdico cada 3 meses para Location managerevitar el embarazo. La progesterona sinttica impide que los ovarios liberen vulos. Tambin hacen que el moco cervical se espese y modifique el tejido de recubrimiento interno del tero. Esto hace ms difcil que los espermatozoides sobrevivan en el tero.  Las pldoras anticonceptivas contienen estrgenos y Education officer, museumprogesterona. Su funcin es ALLTEL Corporationevitar que los ovarios liberen vulos (ovulacin). Las hormonas de los anticonceptivos orales hacen que el moco cervical se haga ms espeso, lo que evita que el esperma ingrese al tero. Las pldoras anticonceptivas son recetadas por el mdico.Tambin se utilizan para tratar los perodos menstruales abundantes.  Minipldora: este tipo de pldora anticonceptiva contiene slo hormona progesterona. Deben tomarse todos los [address]  [address] del mes y debe recetarlas el mdico.  El parche de control de natalidad: contiene hormonas similares a las que contienen las pldoras anticonceptivas. Deben cambiarse una vez por semana y se utilizan bajo  prescripcin mdica.  Anillo vaginal: contiene hormonas similares a las que contienen las pldoras anticonceptivas. Se deja colocado durante tres semanas, se lo retira durante 1 semana y luego se coloca uno nuevo. La paciente debe sentirse cmoda al insertar y retirar el anillo de la vagina.Es necesaria la prescripcin mdica.  Anticonceptivos de emergencia: son mtodos para evitar un embarazo despus de Neomia Dearuna relacin sexual sin proteccin. Esta pldora puede tomarse inmediatamente despus de Child psychotherapisttener relaciones sexuales o hasta 5 Marquettedas de haber tenido sexo sin proteccin. Es ms efectiva si se toma poco tiempo despus de la relacin sexual. Los anticonceptivos de emergencia estn disponibles sin prescripcin mdica. Consltelo con su farmacutico. No use los anticonceptivos de emergencia como nico mtodo anticonceptivo. MTODOS DE Lenis NoonBARRERA   Condn masculino: es una vaina delgada (ltex o goma) que se coloca cubriendo al pene durante el acto sexual. Deri Fuellinguede usarse con espermicida para aumentar la efectividad.  Condn femenino. Es una funda delicada y blanda que se adapta holgadamente a la vagina antes de las Clinical research associaterelaciones sexuales.  Diafragma: es una barrera de ltex redonda y suave que debe ser recomendado por un profesional. Se inserta en la vagina, junto con un gel espermicida. Debe insertarse antes de Management consultanttener relaciones sexuales. Debe dejar el diafragma colocado en la vagina durante 6 a 8 horas despus de la relacin sexual.  Capuchn cervical: es una barrera de ltex o taza plstica redonda y Bahamassuave que cubre el cuello del tero y debe ser colocada por un mdico. Puede dejarlo colocado en la vagina hasta 48 horas despus de las Clinical research associaterelaciones sexuales.  Esponja: es una pieza blanda y circular de espuma de poliuretano. Contiene un espermicida. Se inserta en la vagina despus de mojarla  y antes de las The St. Paul Travelersrelaciones sexuales.  Espermicidas: son sustancias qumicas que matan o bloquean al esperma y no lo dejan  ingresar al cuello del tero y al tero. Vienen en forma de cremas, geles, supositorios, espuma o comprimidos. No es necesario tener Emergency planning/management officerreceta mdica. Se insertan en la vagina con un aplicador antes de Management consultanttener relaciones sexuales. El proceso debe repetirse cada vez que tiene relaciones sexuales. ANTICONCEPTIVOS INTRAUTERINOS  Dispositivo intrauterino (DIU) es un dispositivo en forma de T que se coloca en el tero durante el perodo menstrual, para Location managerevitar el embarazo. Hay dos tipos:  DIU de cobre: este tipo de DIU est recubierto con un alambre de cobre y se inserta dentro del tero. El cobre hace que el tero y las trompas de Falopio produzcan un liquido que Federated Department Storesdestruye los espermatozoides. Puede permanecer colocado durante 10 aos.  DIU con hormona: este tipo de DIU contiene la hormona progestina (progesterona sinttica). La hormona espesa el moco cervical y evita que los espermatozoides ingresen al tero y tambin afina la membrana que cubre el tero para evitar la implantacin del vulo fertilizado. La hormona debilita o destruye los espermatozoides que ingresan al tero. Puede Geneticist, molecularpermanecer en el lugar durante 3-5 aos, segn el tipo de DIU que se Santa Venetiautilice. MTODOS ANTICONCEPTIVOS PERMANENTES  Ligadura de trompas en la mujer: se realiza sellando, atando u obstruyendo quirrgicamente las trompas de Falopio lo que impide que el vulo descienda hacia el tero.  Esterilizacin histeroscpica: Implica la colocacin de un pequeo espiral o la insercin en cada trompa de Falopio. El mdico utiliza una tcnica llamada histeroscopa para Primary school teacherrealizar este procedimiento. El dispositivo produce la formacin de tejido Designer, television/film setcicatrizal. Esto da como resultado una obstruccin permanente de las trompas de Falopio, de modo que la esperma no pueda fertilizar el vulo. Demora alrededor de 3 meses despus del procedimiento hasta que el conducto se obstruye. Tendr que usar otro mtodo anticonceptivo durante al menos 3  meses.  Esterilizacin masculina: se realiza ligando los conductos por los que pasan los espermatozoides (vasectoma).Esto impide que el esperma ingrese a la vagina durante el acto sexual. Luego del procedimiento, el hombre puede eyacular lquido (semen). MTODOS DE PLANIFICACIN NATURAL  Planificacin familiar natural: consiste en no Management consultanttener relaciones sexuales o usar un mtodo de barrera (condn, Aroma Parkdiafragma, capuchn cervical) en los IKON Office Solutionsdas que la mujer podra quedar Nekomaembarazada.  Mtodo de calendario: consiste en el seguimiento de la duracin de cada ciclo menstrual y la identificacin de los perodos frtiles.  Mtodo de ovulacin: Paramedicconsiste en evitar las relaciones sexuales durante la ovulacin.  Mtodo sintotrmico: Advertising copywriterconsiste en evitar las relaciones sexuales en la poca en la que se est ovulando, utilizando un termmetro y tendiendo en cuenta los sntomas de la ovulacin.  Mtodo postovulacin: Youth workerconsiste en planificar las relaciones sexuales para despus de haber ovulado. Independientemente del tipo o mtodo anticonceptivo que usted elija, es importante que use condones para protegerse contra las infecciones de transmisin sexual (ETS). Hable con su mdico con respecto a qu mtodo anticonceptivo es el ms apropiado para usted.   Esta informacin no tiene Theme park managercomo fin reemplazar el consejo del mdico. Asegrese de hacerle al mdico cualquier pregunta que tenga.   Document Released: 09/15/2005 Document Revised: 05/18/2013 Elsevier Interactive Patient Education [employer]2016 Elsevier Inc.

## 2016-02-26 ENCOUNTER — Ambulatory Visit (INDEPENDENT_AMBULATORY_CARE_PROVIDER_SITE_OTHER): Payer: Self-pay | Admitting: *Deleted

## 2016-02-26 VITALS — BP 100/62 | HR 89 | Wt 156.0 lb

## 2016-02-26 DIAGNOSIS — O24415 Gestational diabetes mellitus in pregnancy, controlled by oral hypoglycemic drugs: Secondary | ICD-10-CM

## 2016-02-29 ENCOUNTER — Encounter: Payer: Self-pay | Admitting: Obstetrics and Gynecology

## 2016-02-29 ENCOUNTER — Ambulatory Visit (INDEPENDENT_AMBULATORY_CARE_PROVIDER_SITE_OTHER): Payer: Self-pay | Admitting: Obstetrics and Gynecology

## 2016-02-29 VITALS — BP 96/65 | HR 90 | Wt 156.0 lb

## 2016-02-29 DIAGNOSIS — O0993 Supervision of high risk pregnancy, unspecified, third trimester: Secondary | ICD-10-CM

## 2016-02-29 DIAGNOSIS — O24415 Gestational diabetes mellitus in pregnancy, controlled by oral hypoglycemic drugs: Secondary | ICD-10-CM

## 2016-02-29 DIAGNOSIS — O24414 Gestational diabetes mellitus in pregnancy, insulin controlled: Secondary | ICD-10-CM

## 2016-02-29 DIAGNOSIS — O09213 Supervision of pregnancy with history of pre-term labor, third trimester: Secondary | ICD-10-CM

## 2016-02-29 DIAGNOSIS — O09893 Supervision of other high risk pregnancies, third trimester: Secondary | ICD-10-CM

## 2016-02-29 MED ORDER — GLYBURIDE 2.5 MG PO TABS: 3.7500 mg | ORAL_TABLET | Freq: Every day | ORAL | Status: DC

## 2016-02-29 NOTE — Progress Notes (Signed)
Subjective:  Michelle Cross is a 35 y.o. [phone] at 7160w6d being seen today for ongoing prenatal care.  She is currently monitored for the following issues for this high-risk pregnancy and has History of preterm delivery, currently pregnant; Supervision of high risk pregnancy, antepartum; and Gestational diabetes on her problem list.  Patient reports no complaints.  Contractions: Irregular. Vag. Bleeding: None.  Movement: Present. Denies leaking of fluid.   The following portions of the patient's history were reviewed and updated as appropriate: allergies, current medications, past family history, past medical history, past social history, past surgical history and problem list. Problem list updated.  Objective:   Filed Vitals:   02/29/16 0830  BP: 96/65  Pulse: 90  Weight: 156 lb (70.761 kg)    Fetal Status: Fetal Heart Rate (bpm): NST   Movement: Present  Presentation: Vertex  General:  Alert, oriented and cooperative. Patient is in no acute distress.  Skin: Skin is warm and dry. No rash noted.   Cardiovascular: Normal heart rate noted  Respiratory: Normal respiratory effort, no problems with respiration noted  Abdomen: Soft, gravid, appropriate for gestational age. Pain/Pressure: Present     Pelvic: Vag. Bleeding: None Vag D/C Character: Thin   Cervical exam deferred        Extremities: Normal range of motion.  Edema: None  Mental Status: Normal mood and affect. Normal behavior. Normal judgment and thought content.   Urinalysis: Urine Protein: Trace Urine Glucose: Negative  Assessment and Plan:  Pregnancy: [id number] at 5960w6d  1. History of preterm delivery, currently pregnant, third trimester PTL precautions reviewed Declined 17-P  2. Supervision of high risk pregnancy, antepartum, third trimester Patient is doing well without complaints  3. Insulin controlled gestational diabetes mellitus in third trimester CBGs reviewed- fasting 65-93 and pp 130's Will add 2.5  mg glyburide in am and continue 3.75 mg qHS NST reviewed and reactive AFI 15   4. Gestational diabetes mellitus (GDM) in third trimester controlled on oral hypoglycemic drug  - glyBURIDE (DIABETA) 2.5 MG tablet; Take 1.5 tablets (3.75 mg total) by mouth at bedtime.  Dispense: 60 tablet; Refill: 3  Preterm labor symptoms and general obstetric precautions including but not limited to vaginal bleeding, contractions, leaking of fluid and fetal movement were reviewed in detail with the patient. Please refer to After Visit Summary for other counseling recommendations.  Return in about 1 week (around 03/07/2016).   Catalina AntiguaPeggy Greogory Cornette, MD

## 2016-03-04 ENCOUNTER — Ambulatory Visit (INDEPENDENT_AMBULATORY_CARE_PROVIDER_SITE_OTHER): Payer: Self-pay | Admitting: *Deleted

## 2016-03-04 VITALS — HR 82

## 2016-03-04 DIAGNOSIS — O0993 Supervision of high risk pregnancy, unspecified, third trimester: Secondary | ICD-10-CM

## 2016-03-04 NOTE — Progress Notes (Signed)
NST reviewed and reactive.  

## 2016-03-07 ENCOUNTER — Ambulatory Visit (INDEPENDENT_AMBULATORY_CARE_PROVIDER_SITE_OTHER): Payer: Self-pay | Admitting: Obstetrics and Gynecology

## 2016-03-07 VITALS — BP 100/60 | HR 94 | Wt 156.0 lb

## 2016-03-07 DIAGNOSIS — O24415 Gestational diabetes mellitus in pregnancy, controlled by oral hypoglycemic drugs: Secondary | ICD-10-CM

## 2016-03-07 DIAGNOSIS — O0993 Supervision of high risk pregnancy, unspecified, third trimester: Secondary | ICD-10-CM

## 2016-03-07 DIAGNOSIS — O09893 Supervision of other high risk pregnancies, third trimester: Secondary | ICD-10-CM

## 2016-03-07 DIAGNOSIS — O09213 Supervision of pregnancy with history of pre-term labor, third trimester: Secondary | ICD-10-CM

## 2016-03-07 DIAGNOSIS — O24419 Gestational diabetes mellitus in pregnancy, unspecified control: Secondary | ICD-10-CM

## 2016-03-07 NOTE — Progress Notes (Signed)
Subjective:  Michelle Cross is a 35 y.o. [phone] at 7143w6d being seen today for ongoing prenatal care.  She is currently monitored for the following issues for this high-risk pregnancy and has History of preterm delivery, currently pregnant; Supervision of high risk pregnancy, antepartum; and Gestational diabetes on her problem list.  Patient reports no complaints.   Contractions: Irregular. Vag. Bleeding: None.  Movement: Present. Denies leaking of fluid.   The following portions of the patient's history were reviewed and updated as appropriate: allergies, current medications, past family history, past medical history, past social history, past surgical history and problem list. Problem list updated.  Objective:   Filed Vitals:   03/07/16 0853  BP: 100/60  Pulse: 94  Weight: 156 lb (70.761 kg)    Fetal Status: Fetal Heart Rate (bpm):  NST   Movement: Present     General:  Alert, oriented and cooperative. Patient is in no acute distress.  Skin: Skin is warm and dry. No rash noted.   Cardiovascular: Normal heart rate noted  Respiratory: Normal respiratory effort, no problems with respiration noted  Abdomen: Soft, gravid, appropriate for gestational age. Pain/Pressure: Present     Pelvic:  Cervical exam deferred        Extremities: Normal range of motion.  Edema: Trace  Mental Status: Normal mood and affect. Normal behavior. Normal judgment and thought content.   Urinalysis: Urine Protein: 1+ Urine Glucose: Negative  Assessment and Plan:  Pregnancy: [id number] at 5943w6d  1. Supervision of high risk pregnancy, antepartum, third trimester Routine care. GBS, GC/CT next visit  2. GDMA2 (glyburide) - Fetal nonstress test: reactive - Amniotic fluid index normal -continue on glyburide 2/5/3.75 (pt confirms) and f/u in 1wk. BS log good with a few in the low to mid 120s -Since A2, will put on 2x/week testing. Growth scan in two weeks   3. History of preterm delivery, currently  pregnant, third trimester Declined 17-P. S/p two term VDs after not being on 17-P  Preterm labor symptoms and general obstetric precautions including but not limited to vaginal bleeding, contractions, leaking of fluid and fetal movement were reviewed in detail with the patient. Please refer to After Visit Summary for other counseling recommendations.    Fillmore Bingharlie Yvonne Petite, MD

## 2016-03-11 ENCOUNTER — Ambulatory Visit (INDEPENDENT_AMBULATORY_CARE_PROVIDER_SITE_OTHER): Payer: Self-pay | Admitting: Obstetrics & Gynecology

## 2016-03-11 VITALS — BP 102/55 | HR 82

## 2016-03-11 DIAGNOSIS — K219 Gastro-esophageal reflux disease without esophagitis: Secondary | ICD-10-CM

## 2016-03-11 DIAGNOSIS — O09213 Supervision of pregnancy with history of pre-term labor, third trimester: Secondary | ICD-10-CM

## 2016-03-11 DIAGNOSIS — Z36 Encounter for antenatal screening of mother: Secondary | ICD-10-CM

## 2016-03-11 DIAGNOSIS — O0993 Supervision of high risk pregnancy, unspecified, third trimester: Secondary | ICD-10-CM

## 2016-03-11 DIAGNOSIS — Z113 Encounter for screening for infections with a predominantly sexual mode of transmission: Secondary | ICD-10-CM

## 2016-03-11 DIAGNOSIS — O99613 Diseases of the digestive system complicating pregnancy, third trimester: Secondary | ICD-10-CM

## 2016-03-11 DIAGNOSIS — O09893 Supervision of other high risk pregnancies, third trimester: Secondary | ICD-10-CM

## 2016-03-11 DIAGNOSIS — O24415 Gestational diabetes mellitus in pregnancy, controlled by oral hypoglycemic drugs: Secondary | ICD-10-CM

## 2016-03-11 MED ORDER — GLYBURIDE 2.5 MG PO TABS: ORAL_TABLET | ORAL | Status: DC

## 2016-03-11 NOTE — Progress Notes (Signed)
Growth US scheduled 03/20/16 - [facility]

## 2016-03-11 NOTE — Progress Notes (Signed)
Subjective:  Michelle Cross is a 35 y.o. [phone] at 4242w3d being seen today for ongoing prenatal care.  She is currently monitored for the following issues for this high-risk pregnancy and has History of preterm delivery, currently pregnant; Supervision of high risk pregnancy, antepartum; and GDM, class A2 on her problem list. Patient is Spanish-speaking only, Spanish interpreter present for this encounter.  Patient reports heartburn and occasional contractions. Wants to know what she can take for GERD. Contractions: Irregular. Vag. Bleeding: None.  Movement: Present. Denies leaking of fluid.   The following portions of the patient's history were reviewed and updated as appropriate: allergies, current medications, past family history, past medical history, past social history, past surgical history and problem list. Problem list updated.  Objective:   Filed Vitals:   03/11/16 0833  BP: 102/55  Pulse: 82    Fetal Status: Fetal Heart Rate (bpm): NST Fundal Height: 35 cm Movement: Present  Presentation: Vertex  General:  Alert, oriented and cooperative. Patient is in no acute distress.  Skin: Skin is warm and dry. No rash noted.   Cardiovascular: Normal heart rate noted  Respiratory: Normal respiratory effort, no problems with respiration noted  Abdomen: Soft, gravid, appropriate for gestational age. Pain/Pressure: Present     Pelvic: Cervical exam performed Dilation: 3 Effacement (%): 30 Station: -3  Extremities: Normal range of motion.  Edema: Trace  Mental Status: Normal mood and affect. Normal behavior. Normal judgment and thought content.   Urinalysis: Urine Protein: 1+ Urine Glucose: Negative CBGs: Within normal range NST performed today was reviewed and was found to be reactive.    Assessment and Plan:  Pregnancy: [id number] at 1542w3d  1. Gestational diabetes mellitus (GDM) in third trimester controlled on oral hypoglycemic drug CBGs are well controlled on glyburide.  Growth scaan on 03/20/16. - Fetal nonstress test reactive today, will continue recommended antenatal testing and prenatal care. - Culture, beta strep (group b only) - GC[facility]Chlamydia probe amp [facility] - glyBURIDE (DIABETA) 2.5 MG tablet; Take 1 tablet by mouth with breakfast and 1.5 tablets at bedtime  Dispense: 60 tablet; Refill: 3  2. History of preterm delivery, currently pregnant, third trimester Multiparous exam today, PTL precautions reviewed  3. Gastroesophageal reflux in pregancy in third trimester Recommended OTC Zantac and Pepcid  4. Supervision of high risk pregnancy, antepartum, third trimester Preterm labor symptoms and general obstetric precautions including but not limited to vaginal bleeding, contractions, leaking of fluid and fetal movement were reviewed in detail with the patient. Please refer to After Visit Summary for other counseling recommendations.  Return in about 1 week (around 03/18/2016) for OB Visit, NST.   Tereso NewcomerUgonna A Chijioke Lasser, MD

## 2016-03-11 NOTE — Patient Instructions (Addendum)
Regrese a la clinica cuando tenga su cita. Si tiene problemas o preguntas, llama a la clinica o vaya a la sala de emergencia al [facility].  Pepcid  Zantac

## 2016-03-12 LAB — GC/CHLAMYDIA PROBE AMP (~~LOC~~) NOT AT ARMC
CHLAMYDIA, DNA PROBE: NEGATIVE
Neisseria Gonorrhea: NEGATIVE

## 2016-03-12 LAB — CULTURE, BETA STREP (GROUP B ONLY)

## 2016-03-13 ENCOUNTER — Encounter: Payer: Self-pay | Admitting: Obstetrics & Gynecology

## 2016-03-13 DIAGNOSIS — O99820 Streptococcus B carrier state complicating pregnancy: Secondary | ICD-10-CM | POA: Insufficient documentation

## 2016-03-14 ENCOUNTER — Ambulatory Visit (INDEPENDENT_AMBULATORY_CARE_PROVIDER_SITE_OTHER): Payer: Self-pay | Admitting: Family Medicine

## 2016-03-14 VITALS — BP 105/59 | HR 80

## 2016-03-14 DIAGNOSIS — O24415 Gestational diabetes mellitus in pregnancy, controlled by oral hypoglycemic drugs: Secondary | ICD-10-CM

## 2016-03-14 DIAGNOSIS — O0993 Supervision of high risk pregnancy, unspecified, third trimester: Secondary | ICD-10-CM

## 2016-03-14 DIAGNOSIS — O24419 Gestational diabetes mellitus in pregnancy, unspecified control: Secondary | ICD-10-CM

## 2016-03-14 LAB — OB RESULTS CONSOLE GBS: GBS: POSITIVE

## 2016-03-14 NOTE — Patient Instructions (Signed)
Gestational Diabetes Mellitus Gestational diabetes mellitus, often simply referred to as gestational diabetes, is a type of diabetes that some women develop during pregnancy. In gestational diabetes, the pancreas does not make enough insulin (a hormone), the cells are less responsive to the insulin that is made (insulin resistance), or both.Normally, insulin moves sugars from food into the tissue cells. The tissue cells use the sugars for energy. The lack of insulin or the lack of normal response to insulin causes excess sugars to build up in the blood instead of going into the tissue cells. As a result, high blood sugar (hyperglycemia) develops. The effect of high sugar (glucose) levels can cause many problems.  RISK FACTORS You have an increased chance of developing gestational diabetes if you have a family history of diabetes and also have one or more of the following risk factors:  A body mass index over 30 (obesity).  A previous pregnancy with gestational diabetes.  An older age at the time of pregnancy. If blood glucose levels are kept in the normal range during pregnancy, women can have a healthy pregnancy. If your blood glucose levels are not well controlled, there may be risks to you, your unborn baby (fetus), your labor and delivery, or your newborn baby.  SYMPTOMS  If symptoms are experienced, they are much like symptoms you would normally expect during pregnancy. The symptoms of gestational diabetes include:   Increased thirst (polydipsia).  Increased urination (polyuria).  Increased urination during the night (nocturia).  Weight loss. This weight loss may be rapid.  Frequent, recurring infections.  Tiredness (fatigue).  Weakness.  Vision changes, such as blurred vision.  Fruity smell to your breath.  Abdominal pain. DIAGNOSIS Diabetes is diagnosed when blood glucose levels are increased. Your blood glucose level may be checked by one or more of the following blood  tests:  A fasting blood glucose test. You will not be allowed to eat for at least 8 hours before a blood sample is taken.  A random blood glucose test. Your blood glucose is checked at any time of the day regardless of when you ate.  An oral glucose tolerance test (OGTT). Your blood glucose is measured after you have not eaten (fasted) for 1-3 hours and then after you drink a glucose-containing beverage. Since the hormones that cause insulin resistance are highest at about 24-28 weeks of a pregnancy, an OGTT is usually performed during that time. If you have risk factors, you may be screened for undiagnosed type 2 diabetes at your first prenatal visit. TREATMENT  Gestational diabetes should be managed first with diet and exercise. Medicines may be added only if they are needed.  You will need to take diabetes medicine or insulin daily to keep blood glucose levels in the desired range.  You will need to match insulin dosing with exercise and healthy food choices. If you have gestational diabetes, your treatment goal is to maintain the following blood glucose levels:  Before meals (preprandial): at or below 95 mg/dL.  After meals (postprandial):  One hour after a meal: at or below 140 mg/dL.  Two hours after a meal: at or below 120 mg/dL. If you have pre-existing type 1 or type 2 diabetes, your treatment goal is to maintain the following blood glucose levels:  Before meals, at bedtime, and overnight: 60-99 mg/dL.  After meals: peak of 100-129 mg/dL. HOME CARE INSTRUCTIONS   Have your hemoglobin A1c level checked twice a year.  Perform daily blood glucose monitoring as directed by   your health care provider. It is common to perform frequent blood glucose monitoring.  Monitor urine ketones when you are ill and as directed by your health care provider.  Take your diabetes medicine and insulin as directed by your health care provider to maintain your blood glucose level in the desired  range.  Never run out of diabetes medicine or insulin. It is needed every day.  Adjust insulin based on your intake of carbohydrates. Carbohydrates can raise blood glucose levels but need to be included in your diet. Carbohydrates provide vitamins, minerals, and fiber which are an essential part of a healthy diet. Carbohydrates are found in fruits, vegetables, whole grains, dairy products, legumes, and foods containing added sugars.  Eat healthy foods. Alternate 3 meals with 3 snacks.  Maintain a healthy weight gain. The usual total expected weight gain varies according to your prepregnancy body mass index (BMI).  Carry a medical alert card or wear your medical alert jewelry.  Carry a 15-gram carbohydrate snack with you at all times to treat low blood glucose (hypoglycemia). Some examples of 15-gram carbohydrate snacks include:  Glucose tablets, 3 or 4.  Glucose gel, 15-gram tube.  Raisins, 2 tablespoons (24 g).  Jelly beans, 6.  Animal crackers, 8.  Fruit juice, regular soda, or low-fat milk, 4 ounces (120 mL).  Gummy treats, 9.  Recognize hypoglycemia. Hypoglycemia during pregnancy occurs with blood glucose levels of 60 mg/dL and below. The risk for hypoglycemia increases when fasting or skipping meals, during or after intense exercise, and during sleep. Hypoglycemia symptoms can include:  Tremors or shakes.  Decreased ability to concentrate.  Sweating.  Increased heart rate.  Headache.  Dry mouth.  Hunger.  Irritability.  Anxiety.  Restless sleep.  Altered speech or coordination.  Confusion.  Treat hypoglycemia promptly. If you are alert and able to safely swallow, follow the 15:15 rule:  Take 15-20 grams of rapid-acting glucose or carbohydrate. Rapid-acting options include glucose gel, glucose tablets, or 4 ounces (120 mL) of fruit juice, regular soda, or low-fat milk.  Check your blood glucose level 15 minutes after taking the glucose.  Take 15-20  grams more of glucose if the repeat blood glucose level is still 70 mg/dL or below.  Eat a meal or snack within 1 hour once blood glucose levels return to normal.  Be alert to polyuria (excess urination) and polydipsia (excess thirst) which are early signs of hyperglycemia. An early awareness of hyperglycemia allows for prompt treatment. Treat hyperglycemia as directed by your health care provider.  Engage in at least 30 minutes of physical activity a day or as directed by your health care provider. Ten minutes of physical activity timed 30 minutes after each meal is encouraged to control postprandial blood glucose levels.  Adjust your insulin dosing and food intake as needed if you start a new exercise or sport.  Follow your sick-day plan at any time you are unable to eat or drink as usual.  Avoid tobacco and alcohol use.  Keep all follow-up visits as directed by your health care provider.  Follow the advice of your health care provider regarding your prenatal and post-delivery (postpartum) appointments, meal planning, exercise, medicines, vitamins, blood tests, other medical tests, and physical activities.  Perform daily skin and foot care. Examine your skin and feet daily for cuts, bruises, redness, nail problems, bleeding, blisters, or sores.  Brush your teeth and gums at least twice a day and floss at least once a day. Follow up with your dentist   regularly.  Schedule an eye exam during the first trimester of your pregnancy or as directed by your health care provider.  Share your diabetes management plan with your workplace or school.  Stay up-to-date with immunizations.  Learn to manage stress.  Obtain ongoing diabetes education and support as needed.  Learn about and consider breastfeeding your baby.  You should have your blood sugar level checked 6-12 weeks after delivery. This is done with an oral glucose tolerance test (OGTT). SEEK MEDICAL CARE IF:   You are unable to  eat food or drink fluids for more than 6 hours.  You have nausea and vomiting for more than 6 hours.  You have a blood glucose level of 200 mg/dL and you have ketones in your urine.  There is a change in mental status.  You develop vision problems.  You have a persistent headache.  You have upper abdominal pain or discomfort.  You develop an additional serious illness.  You have diarrhea for more than 6 hours.  You have been sick or have had a fever for a couple of days and are not getting better. SEEK IMMEDIATE MEDICAL CARE IF:   You have difficulty breathing.  You no longer feel the baby moving.  You are bleeding or have discharge from your vagina.  You start having premature contractions or labor. MAKE SURE YOU:  Understand these instructions.  Will watch your condition.  Will get help right away if you are not doing well or get worse.   This information is not intended to replace advice given to you by your health care provider. Make sure you discuss any questions you have with your health care provider.   Document Released: 12/22/2000 Document Revised: 10/06/2014 Document Reviewed: 04/13/2012 [employer] Interactive Patient Education 2016 [employer]  Breastfeeding Deciding to breastfeed is one of the best choices you can make for you and your baby. A change in hormones during pregnancy causes your breast tissue to grow and increases the number and size of your milk ducts. These hormones also allow proteins, sugars, and fats from your blood supply to make breast milk in your milk-producing glands. Hormones prevent breast milk from being released before your baby is born as well as prompt milk flow after birth. Once breastfeeding has begun, thoughts of your baby, as well as his or her sucking or crying, can stimulate the release of milk from your milk-producing glands.  BENEFITS OF BREASTFEEDING For Your Baby  Your first milk (colostrum) helps your baby's digestive  system function better.  There are antibodies in your milk that help your baby fight off infections.  Your baby has a lower incidence of asthma, allergies, and sudden infant death syndrome.  The nutrients in breast milk are better for your baby than infant formulas and are designed uniquely for your baby's needs.  Breast milk improves your baby's brain development.  Your baby is less likely to develop other conditions, such as childhood obesity, asthma, or type 2 diabetes mellitus. For You  Breastfeeding helps to create a very special bond between you and your baby.  Breastfeeding is convenient. Breast milk is always available at the correct temperature and costs nothing.  Breastfeeding helps to burn calories and helps you lose the weight gained during pregnancy.  Breastfeeding makes your uterus contract to its prepregnancy size faster and slows bleeding (lochia) after you give birth.   Breastfeeding helps to lower your risk of developing type 2 diabetes mellitus, osteoporosis, and breast or ovarian cancer   later in life. SIGNS THAT YOUR BABY IS HUNGRY Early Signs of Hunger  Increased alertness or activity.  Stretching.  Movement of the head from side to side.  Movement of the head and opening of the mouth when the corner of the mouth or cheek is stroked (rooting).  Increased sucking sounds, smacking lips, cooing, sighing, or squeaking.  Hand-to-mouth movements.  Increased sucking of fingers or hands. Late Signs of Hunger  Fussing.  Intermittent crying. Extreme Signs of Hunger Signs of extreme hunger will require calming and consoling before your baby will be able to breastfeed successfully. Do not wait for the following signs of extreme hunger to occur before you initiate breastfeeding:  Restlessness.  A loud, strong cry.  Screaming. BREASTFEEDING BASICS Breastfeeding Initiation  Find a comfortable place to sit or lie down, with your neck and back well  supported.  Place a pillow or rolled up blanket under your baby to bring him or her to the level of your breast (if you are seated). Nursing pillows are specially designed to help support your arms and your baby while you breastfeed.  Make sure that your baby's abdomen is facing your abdomen.  Gently massage your breast. With your fingertips, massage from your chest wall toward your nipple in a circular motion. This encourages milk flow. You may need to continue this action during the feeding if your milk flows slowly.  Support your breast with 4 fingers underneath and your thumb above your nipple. Make sure your fingers are well away from your nipple and your baby's mouth.  Stroke your baby's lips gently with your finger or nipple.  When your baby's mouth is open wide enough, quickly bring your baby to your breast, placing your entire nipple and as much of the colored area around your nipple (areola) as possible into your baby's mouth.  More areola should be visible above your baby's upper lip than below the lower lip.  Your baby's tongue should be between his or her lower gum and your breast.  Ensure that your baby's mouth is correctly positioned around your nipple (latched). Your baby's lips should create a seal on your breast and be turned out (everted).  It is common for your baby to suck about 2-3 minutes in order to start the flow of breast milk. Latching Teaching your baby how to latch on to your breast properly is very important. An improper latch can cause nipple pain and decreased milk supply for you and poor weight gain in your baby. Also, if your baby is not latched onto your nipple properly, he or she may swallow some air during feeding. This can make your baby fussy. Burping your baby when you switch breasts during the feeding can help to get rid of the air. However, teaching your baby to latch on properly is still the best way to prevent fussiness from swallowing air while  breastfeeding. Signs that your baby has successfully latched on to your nipple:  Silent tugging or silent sucking, without causing you pain.  Swallowing heard between every 3-4 sucks.  Muscle movement above and in front of his or her ears while sucking. Signs that your baby has not successfully latched on to nipple:  Sucking sounds or smacking sounds from your baby while breastfeeding.  Nipple pain. If you think your baby has not latched on correctly, slip your finger into the corner of your baby's mouth to break the suction and place it between your baby's gums. Attempt breastfeeding initiation again. Signs   of Successful Breastfeeding Signs from your baby:  A gradual decrease in the number of sucks or complete cessation of sucking.  Falling asleep.  Relaxation of his or her body.  Retention of a small amount of milk in his or her mouth.  Letting go of your breast by himself or herself. Signs from you:  Breasts that have increased in firmness, weight, and size 1-3 hours after feeding.  Breasts that are softer immediately after breastfeeding.  Increased milk volume, as well as a change in milk consistency and color by the fifth day of breastfeeding.  Nipples that are not sore, cracked, or bleeding. Signs That Your Baby is Getting Enough Milk  Wetting at least 3 diapers in a 24-hour period. The urine should be clear and pale yellow by age 5 days.  At least 3 stools in a 24-hour period by age 5 days. The stool should be soft and yellow.  At least 3 stools in a 24-hour period by age 7 days. The stool should be seedy and yellow.  No loss of weight greater than 10% of birth weight during the first 3 days of age.  Average weight gain of 4-7 ounces (113-198 g) per week after age 4 days.  Consistent daily weight gain by age 5 days, without weight loss after the age of 2 weeks. After a feeding, your baby may spit up a small amount. This is common. BREASTFEEDING FREQUENCY AND  DURATION Frequent feeding will help you make more milk and can prevent sore nipples and breast engorgement. Breastfeed when you feel the need to reduce the fullness of your breasts or when your baby shows signs of hunger. This is called "breastfeeding on demand." Avoid introducing a pacifier to your baby while you are working to establish breastfeeding (the first 4-6 weeks after your baby is born). After this time you may choose to use a pacifier. Research has shown that pacifier use during the first year of a baby's life decreases the risk of sudden infant death syndrome (SIDS). Allow your baby to feed on each breast as long as he or she wants. Breastfeed until your baby is finished feeding. When your baby unlatches or falls asleep while feeding from the first breast, offer the second breast. Because newborns are often sleepy in the first few weeks of life, you may need to awaken your baby to get him or her to feed. Breastfeeding times will vary from baby to baby. However, the following rules can serve as a guide to help you ensure that your baby is properly fed:  Newborns (babies 4 weeks of age or younger) may breastfeed every 1-3 hours.  Newborns should not go longer than 3 hours during the day or 5 hours during the night without breastfeeding.  You should breastfeed your baby a minimum of 8 times in a 24-hour period until you begin to introduce solid foods to your baby at around 6 months of age. BREAST MILK PUMPING Pumping and storing breast milk allows you to ensure that your baby is exclusively fed your breast milk, even at times when you are unable to breastfeed. This is especially important if you are going back to work while you are still breastfeeding or when you are not able to be present during feedings. Your lactation consultant can give you guidelines on how long it is safe to store breast milk. A breast pump is a machine that allows you to pump milk from your breast into a sterile bottle.  The pumped   breast milk can then be stored in a refrigerator or freezer. Some breast pumps are operated by hand, while others use electricity. Ask your lactation consultant which type will work best for you. Breast pumps can be purchased, but some hospitals and breastfeeding support groups lease breast pumps on a monthly basis. A lactation consultant can teach you how to hand express breast milk, if you prefer not to use a pump. CARING FOR YOUR BREASTS WHILE YOU BREASTFEED Nipples can become dry, cracked, and sore while breastfeeding. The following recommendations can help keep your breasts moisturized and healthy:  Avoid using soap on your nipples.  Wear a supportive bra. Although not required, special nursing bras and tank tops are designed to allow access to your breasts for breastfeeding without taking off your entire bra or top. Avoid wearing underwire-style bras or extremely tight bras.  Air dry your nipples for 3-4minutes after each feeding.  Use only cotton bra pads to absorb leaked breast milk. Leaking of breast milk between feedings is normal.  Use lanolin on your nipples after breastfeeding. Lanolin helps to maintain your skin's normal moisture barrier. If you use pure lanolin, you do not need to wash it off before feeding your baby again. Pure lanolin is not toxic to your baby. You may also hand express a few drops of breast milk and gently massage that milk into your nipples and allow the milk to air dry. In the first few weeks after giving birth, some women experience extremely full breasts (engorgement). Engorgement can make your breasts feel heavy, warm, and tender to the touch. Engorgement peaks within 3-5 days after you give birth. The following recommendations can help ease engorgement:  Completely empty your breasts while breastfeeding or pumping. You may want to start by applying warm, moist heat (in the shower or with warm water-soaked hand towels) just before feeding or pumping.  This increases circulation and helps the milk flow. If your baby does not completely empty your breasts while breastfeeding, pump any extra milk after he or she is finished.  Wear a snug bra (nursing or regular) or tank top for 1-2 days to signal your body to slightly decrease milk production.  Apply ice packs to your breasts, unless this is too uncomfortable for you.  Make sure that your baby is latched on and positioned properly while breastfeeding. If engorgement persists after 48 hours of following these recommendations, contact your health care provider or a lactation consultant. OVERALL HEALTH CARE RECOMMENDATIONS WHILE BREASTFEEDING  Eat healthy foods. Alternate between meals and snacks, eating 3 of each per day. Because what you eat affects your breast milk, some of the foods may make your baby more irritable than usual. Avoid eating these foods if you are sure that they are negatively affecting your baby.  Drink milk, fruit juice, and water to satisfy your thirst (about 10 glasses a day).  Rest often, relax, and continue to take your prenatal vitamins to prevent fatigue, stress, and anemia.  Continue breast self-awareness checks.  Avoid chewing and smoking tobacco. Chemicals from cigarettes that pass into breast milk and exposure to secondhand smoke may harm your baby.  Avoid alcohol and drug use, including marijuana. Some medicines that may be harmful to your baby can pass through breast milk. It is important to ask your health care provider before taking any medicine, including all over-the-counter and prescription medicine as well as vitamin and herbal supplements. It is possible to become pregnant while breastfeeding. If birth control is desired, ask   your health care provider about options that will be safe for your baby. SEEK MEDICAL CARE IF:  You feel like you want to stop breastfeeding or have become frustrated with breastfeeding.  You have painful breasts or  nipples.  Your nipples are cracked or bleeding.  Your breasts are red, tender, or warm.  You have a swollen area on either breast.  You have a fever or chills.  You have nausea or vomiting.  You have drainage other than breast milk from your nipples.  Your breasts do not become full before feedings by the fifth day after you give birth.  You feel sad and depressed.  Your baby is too sleepy to eat well.  Your baby is having trouble sleeping.   Your baby is wetting less than 3 diapers in a 24-hour period.  Your baby has less than 3 stools in a 24-hour period.  Your baby's skin or the white part of his or her eyes becomes yellow.   Your baby is not gaining weight by 5 days of age. SEEK IMMEDIATE MEDICAL CARE IF:  Your baby is overly tired (lethargic) and does not want to wake up and feed.  Your baby develops an unexplained fever.   This information is not intended to replace advice given to you by your health care provider. Make sure you discuss any questions you have with your health care provider.   Document Released: 09/15/2005 Document Revised: 06/06/2015 Document Reviewed: 03/09/2013 [employer] Interactive Patient Education 2016 [employer]  

## 2016-03-14 NOTE — Progress Notes (Signed)
Subjective:  Michelle Cross is a 35 y.o. [phone] at 6947w6d being seen today for ongoing prenatal care.  She is currently monitored for the following issues for this high-risk pregnancy and has History of preterm delivery, currently pregnant; Supervision of high risk pregnancy, antepartum; GDM, class A2; and Group B Streptococcus carrier, +RV culture, currently pregnant on her problem list.  Patient reports no complaints.  Contractions: Irregular. Vag. Bleeding: None.  Movement: Present. Denies leaking of fluid.   The following portions of the patient's history were reviewed and updated as appropriate: allergies, current medications, past family history, past medical history, past social history, past surgical history and problem list. Problem list updated.  Objective:   Filed Vitals:   03/14/16 0952  BP: 105/59  Pulse: 80    Fetal Status: Fetal Heart Rate (bpm): NST   Movement: Present  Presentation: Vertex  General:  Alert, oriented and cooperative. Patient is in no acute distress.  Skin: Skin is warm and dry. No rash noted.   Cardiovascular: Normal heart rate noted  Respiratory: Normal respiratory effort, no problems with respiration noted  Abdomen: Soft, gravid, appropriate for gestational age. Pain/Pressure: Present     Pelvic: Cervical exam deferred        Extremities: Normal range of motion.  Edema: Trace  Mental Status: Normal mood and affect. Normal behavior. Normal judgment and thought content.   Urinalysis: Urine Protein: Negative Urine Glucose: 1+ FBS 95-100 range 2 hr pp 120-130 range Is walking. NST reviewed and reactive. AFI WNL, vtx Assessment and Plan:  Pregnancy: [id number] at 6347w6d  1. Supervision of high risk pregnancy, antepartum, third trimester Continue  prenatal care.  - Culture, beta strep (group b only) - Fetal nonstress test; Future - Amniotic fluid index - Urine cytology ancillary only  2. GDM, class A2 Continue glyburide 1.5 po q hs and 1  po q am--adjust up if BS get further out of range.  Preterm labor symptoms and general obstetric precautions including but not limited to vaginal bleeding, contractions, leaking of fluid and fetal movement were reviewed in detail with the patient. Please refer to After Visit Summary for other counseling recommendations.  Return in 1 week (on 03/21/2016) for OB visit and NST, NST only in 4 days.   Reva Boresanya S Earlee Herald, MD

## 2016-03-15 LAB — CULTURE, BETA STREP (GROUP B ONLY)

## 2016-03-17 LAB — URINE CYTOLOGY ANCILLARY ONLY
CHLAMYDIA, DNA PROBE: NEGATIVE
NEISSERIA GONORRHEA: NEGATIVE

## 2016-03-18 ENCOUNTER — Ambulatory Visit: Payer: Self-pay | Admitting: *Deleted

## 2016-03-18 VITALS — BP 99/62 | HR 90 | Wt 160.0 lb

## 2016-03-18 DIAGNOSIS — O24415 Gestational diabetes mellitus in pregnancy, controlled by oral hypoglycemic drugs: Secondary | ICD-10-CM

## 2016-03-18 MED ORDER — GLYBURIDE 2.5 MG PO TABS: ORAL_TABLET | ORAL | Status: DC

## 2016-03-18 MED ORDER — FAMOTIDINE 20 MG PO TABS: 20.0000 mg | ORAL_TABLET | Freq: Two times a day (BID) | ORAL | Status: DC

## 2016-03-18 NOTE — Progress Notes (Signed)
Patient ID: Michelle Cross, female   DOB: May 21, 1981, 35 y.o.   MRN: [id number] 6/20 NST reviewed and reactive. CBGs also reviewed and within range

## 2016-03-21 ENCOUNTER — Ambulatory Visit (INDEPENDENT_AMBULATORY_CARE_PROVIDER_SITE_OTHER): Payer: Self-pay | Admitting: Obstetrics and Gynecology

## 2016-03-21 VITALS — BP 114/71 | HR 83 | Wt 159.0 lb

## 2016-03-21 DIAGNOSIS — O24415 Gestational diabetes mellitus in pregnancy, controlled by oral hypoglycemic drugs: Secondary | ICD-10-CM

## 2016-03-21 DIAGNOSIS — Z22330 Carrier of Group B streptococcus: Secondary | ICD-10-CM

## 2016-03-21 DIAGNOSIS — O0993 Supervision of high risk pregnancy, unspecified, third trimester: Secondary | ICD-10-CM

## 2016-03-21 DIAGNOSIS — O24419 Gestational diabetes mellitus in pregnancy, unspecified control: Secondary | ICD-10-CM

## 2016-03-21 DIAGNOSIS — O99820 Streptococcus B carrier state complicating pregnancy: Secondary | ICD-10-CM

## 2016-03-21 NOTE — Progress Notes (Signed)
Subjective:  Michelle Cross is a 35 y.o. [phone] at 5059w6d being seen today for ongoing prenatal care.  She is currently monitored for the following issues for this high-risk pregnancy and has History of preterm delivery, currently pregnant; Supervision of high risk pregnancy, antepartum; GDM, class A2; and Group B Streptococcus carrier, +RV culture, currently pregnant on her problem list.  Patient reports no complaints.   Contractions: Irregular. Vag. Bleeding: None.  Movement: Present. Denies leaking of fluid.   The following portions of the patient's history were reviewed and updated as appropriate: allergies, current medications, past family history, past medical history, past social history, past surgical history and problem list. Problem list updated.  Objective:   Filed Vitals:   03/21/16 0824  BP: 114/71  Pulse: 83  Weight: 159 lb (72.122 kg)    Fetal Status: Fetal Heart Rate (bpm): NST   Movement: Present  Presentation: Vertex  General:  Alert, oriented and cooperative. Patient is in no acute distress.  Skin: Skin is warm and dry. No rash noted.   Cardiovascular: Normal heart rate noted  Respiratory: Normal respiratory effort, no problems with respiration noted  Abdomen: Soft, gravid, appropriate for gestational age. Pain/Pressure: Present     Pelvic:  Cervical exam deferred        Extremities: Normal range of motion.  Edema: Trace  Mental Status: Normal mood and affect. Normal behavior. Normal judgment and thought content.   Urinalysis:      Assessment and Plan:  Pregnancy: [id number] at 7259w6d  *IUP: routine care. GBS pos. D/w this patient (was this with other pregnancies) *GDMa2: Fastings normal. 2hr PPs in the low to mid 120s. Patient currently glyburide 2.5mg /3.75mg  and told to increase to 3.75/3.75. NST reactive and AFI normal today. [facility] called to try and get results of growth scan from yesterday. Preterm labor symptoms and general obstetric precautions  including but not limited to vaginal bleeding, contractions, leaking of fluid and fetal movement were reviewed in detail with the patient. Please refer to After Visit Summary for other counseling recommendations.  Continue with 2x/wk testing Provider visit 1wk  [place] Bingharlie Brealyn Baril, MD

## 2016-03-25 ENCOUNTER — Ambulatory Visit (INDEPENDENT_AMBULATORY_CARE_PROVIDER_SITE_OTHER): Payer: Self-pay | Admitting: Obstetrics & Gynecology

## 2016-03-25 VITALS — BP 110/66 | HR 89

## 2016-03-25 DIAGNOSIS — O24415 Gestational diabetes mellitus in pregnancy, controlled by oral hypoglycemic drugs: Secondary | ICD-10-CM

## 2016-03-25 DIAGNOSIS — O99820 Streptococcus B carrier state complicating pregnancy: Secondary | ICD-10-CM

## 2016-03-25 DIAGNOSIS — O0993 Supervision of high risk pregnancy, unspecified, third trimester: Secondary | ICD-10-CM

## 2016-03-25 DIAGNOSIS — Z22330 Carrier of Group B streptococcus: Secondary | ICD-10-CM

## 2016-03-25 DIAGNOSIS — O24419 Gestational diabetes mellitus in pregnancy, unspecified control: Secondary | ICD-10-CM

## 2016-03-25 NOTE — Patient Instructions (Signed)
Regrese a la clinica cuando tenga su cita. Si tiene problemas o preguntas, llama a la clinica o vaya a la [facility] al [facility].    

## 2016-03-25 NOTE — Progress Notes (Signed)
Subjective:  Michelle Cross is a 35 y.o. [phone] at 8552w3d being seen today for ongoing prenatal care.  She is currently monitored for the following issues for this high-risk pregnancy and has History of preterm delivery, currently pregnant; Supervision of high risk pregnancy, antepartum; GDM, class A2; and Group B Streptococcus carrier, +RV culture, currently pregnant on her problem list. Patient is Spanish-speaking only, Spanish interpreter present for this encounter.  Patient reports occasional painful contractions since yesterday, desires examination.  Contractions: Irregular. Vag. Bleeding: None.  Movement: Present. Denies leaking of fluid.   The following portions of the patient's history were reviewed and updated as appropriate: allergies, current medications, past family history, past medical history, past social history, past surgical history and problem list. Problem list updated.  Objective:   Filed Vitals:   03/25/16 0828  BP: 110/66  Pulse: 89    Fetal Status: Fetal Heart Rate (bpm): NST Fundal Height: 40 cm Movement: Present  Presentation: Vertex  General:  Alert, oriented and cooperative. Patient is in no acute distress.  Skin: Skin is warm and dry. No rash noted.   Cardiovascular: Normal heart rate noted  Respiratory: Normal respiratory effort, no problems with respiration noted  Abdomen: Soft, gravid, appropriate for gestational age. Pain/Pressure: Present     Pelvic: Cervical exam performed Dilation: 3 Effacement (%): 70 Station: -2  Extremities: Normal range of motion.  Edema: Trace  Mental Status: Normal mood and affect. Normal behavior. Normal judgment and thought content.   Urinalysis: Urine Protein: Trace Urine Glucose: Trace NST performed today was reviewed and was found to be reactive.   CBGs are within range Assessment and Plan:  Pregnancy: [id number] at 4152w3d  1. GDM, class A2 Good glycemic control. Continue Glyburide 3.75 mg po bid.  Continue  recommended antenatal testing and prenatal care. [facility] Will schedule IOL at 39 weeks.  2. Group B Streptococcus carrier, +RV culture, currently pregnant Patient informed of need for intrapartum prophylaxis  3. Supervision of high risk pregnancy, antepartum, third trimester Term labor symptoms and general obstetric precautions including but not limited to vaginal bleeding, contractions, leaking of fluid and fetal movement were reviewed in detail with the patient. Please refer to After Visit Summary for other counseling recommendations.  Return in about 3 days (around 03/28/2016) for NST and AFI only.  One week - OB visit and NST.   Tereso NewcomerUgonna A Anyanwu, MD

## 2016-03-28 ENCOUNTER — Ambulatory Visit (INDEPENDENT_AMBULATORY_CARE_PROVIDER_SITE_OTHER): Payer: Self-pay | Admitting: Obstetrics and Gynecology

## 2016-03-28 ENCOUNTER — Encounter (HOSPITAL_COMMUNITY): Payer: Self-pay

## 2016-03-28 ENCOUNTER — Inpatient Hospital Stay (HOSPITAL_COMMUNITY)
Admission: AD | Admit: 2016-03-28 | Discharge: 2016-03-28 | Disposition: A | Discharge status: Home / Self Care | Payer: Self-pay | Source: Ambulatory Visit | Attending: Obstetrics & Gynecology | Admitting: Obstetrics & Gynecology

## 2016-03-28 VITALS — BP 118/77 | HR 92 | Wt 161.0 lb

## 2016-03-28 DIAGNOSIS — O24415 Gestational diabetes mellitus in pregnancy, controlled by oral hypoglycemic drugs: Secondary | ICD-10-CM

## 2016-03-28 DIAGNOSIS — Z22330 Carrier of Group B streptococcus: Secondary | ICD-10-CM

## 2016-03-28 DIAGNOSIS — O24419 Gestational diabetes mellitus in pregnancy, unspecified control: Secondary | ICD-10-CM

## 2016-03-28 DIAGNOSIS — O09213 Supervision of pregnancy with history of pre-term labor, third trimester: Secondary | ICD-10-CM

## 2016-03-28 DIAGNOSIS — O99820 Streptococcus B carrier state complicating pregnancy: Secondary | ICD-10-CM

## 2016-03-28 DIAGNOSIS — O09893 Supervision of other high risk pregnancies, third trimester: Secondary | ICD-10-CM

## 2016-03-28 DIAGNOSIS — Z3A37 37 weeks gestation of pregnancy: Secondary | ICD-10-CM

## 2016-03-28 DIAGNOSIS — O0993 Supervision of high risk pregnancy, unspecified, third trimester: Secondary | ICD-10-CM

## 2016-03-28 DIAGNOSIS — Z3493 Encounter for supervision of normal pregnancy, unspecified, third trimester: Secondary | ICD-10-CM | POA: Insufficient documentation

## 2016-03-28 NOTE — MAU Note (Signed)
Contractions all wk but stronger since last night. Some bloody show today. [facility]

## 2016-03-28 NOTE — Progress Notes (Signed)
Subjective:  Michelle Cross is a 35 y.o. [phone] at 6148w6d being seen today for ongoing prenatal care.  She is currently monitored for the following issues for this high-risk pregnancy and has History of preterm delivery, currently pregnant; Supervision of high risk pregnancy, antepartum; GDM, class A2; and Group B Streptococcus carrier, +RV culture, currently pregnant on her problem list.  Patient reports no complaints.  Contractions: Irregular. Vag. Bleeding: None.  Movement: Present. Denies leaking of fluid.   The following portions of the patient's history were reviewed and updated as appropriate: allergies, current medications, past family history, past medical history, past social history, past surgical history and problem list. Problem list updated.  Objective:   Filed Vitals:   03/28/16 0839  BP: 118/77  Pulse: 92  Weight: 161 lb (73.029 kg)    Fetal Status: Fetal Heart Rate (bpm): NST   Movement: Present     General:  Alert, oriented and cooperative. Patient is in no acute distress.  Skin: Skin is warm and dry. No rash noted.   Cardiovascular: Normal heart rate noted  Respiratory: Normal respiratory effort, no problems with respiration noted  Abdomen: Soft, gravid, appropriate for gestational age. Pain/Pressure: Present     Pelvic: Cervical exam performed        Extremities: Normal range of motion.  Edema: Trace  Mental Status: Normal mood and affect. Normal behavior. Normal judgment and thought content.   Urinalysis: Urine Protein: 1+ Urine Glucose: Negative  Assessment and Plan:  Pregnancy: [id number] at 4948w6d  1. History of preterm delivery, currently pregnant, third trimester   2. Supervision of high risk pregnancy, antepartum, third trimester Patient is doing well  Scheduled for IOL at 39 week on 7/8  3. Group B Streptococcus carrier, +RV culture, currently pregnant Will need treatment in labor  4. GDM CBG reviewed and all within range. Continue  glyburide 3.75 mg BID EFW 3107 gm (58%tile) on 6/22 NST reviewed and reactive  Term labor symptoms and general obstetric precautions including but not limited to vaginal bleeding, contractions, leaking of fluid and fetal movement were reviewed in detail with the patient. Please refer to After Visit Summary for other counseling recommendations.  No Follow-up on file.   Catalina AntiguaPeggy Mohit Zirbes, MD

## 2016-03-28 NOTE — Progress Notes (Signed)
Watch and recheck in about an hour

## 2016-03-31 ENCOUNTER — Other Ambulatory Visit: Payer: Self-pay

## 2016-03-31 ENCOUNTER — Inpatient Hospital Stay (HOSPITAL_COMMUNITY)
Admission: AD | Admit: 2016-03-31 | Discharge: 2016-04-02 | DRG: 775 | Disposition: A | Discharge status: Home / Self Care | Payer: Medicaid Other | Source: Ambulatory Visit | Attending: Obstetrics & Gynecology | Admitting: Obstetrics & Gynecology

## 2016-03-31 ENCOUNTER — Encounter (HOSPITAL_COMMUNITY): Payer: Self-pay | Admitting: Certified Nurse Midwife

## 2016-03-31 DIAGNOSIS — O99344 Other mental disorders complicating childbirth: Secondary | ICD-10-CM | POA: Diagnosis present

## 2016-03-31 DIAGNOSIS — F418 Other specified anxiety disorders: Secondary | ICD-10-CM | POA: Diagnosis present

## 2016-03-31 DIAGNOSIS — Z833 Family history of diabetes mellitus: Secondary | ICD-10-CM | POA: Diagnosis not present

## 2016-03-31 DIAGNOSIS — Z3A38 38 weeks gestation of pregnancy: Secondary | ICD-10-CM

## 2016-03-31 DIAGNOSIS — O99824 Streptococcus B carrier state complicating childbirth: Secondary | ICD-10-CM | POA: Diagnosis present

## 2016-03-31 DIAGNOSIS — O24425 Gestational diabetes mellitus in childbirth, controlled by oral hypoglycemic drugs: Secondary | ICD-10-CM

## 2016-03-31 DIAGNOSIS — O99820 Streptococcus B carrier state complicating pregnancy: Secondary | ICD-10-CM

## 2016-03-31 DIAGNOSIS — IMO0001 Reserved for inherently not codable concepts without codable children: Secondary | ICD-10-CM

## 2016-03-31 LAB — CBC
HCT: 41.7 % (ref 36.0–46.0)
Hemoglobin: 15.4 g/dL — ABNORMAL HIGH (ref 12.0–15.0)
MCH: 31.7 pg (ref 26.0–34.0)
MCHC: 36.9 g/dL — AB (ref 30.0–36.0)
MCV: 85.8 fL (ref 78.0–100.0)
PLATELETS: 242 10*3/uL (ref 150–400)
RBC: 4.86 MIL/uL (ref 3.87–5.11)
RDW: 13.5 % (ref 11.5–15.5)
WBC: 15.6 10*3/uL — AB (ref 4.0–10.5)

## 2016-03-31 LAB — TYPE AND SCREEN
ABO/RH(D): B POS
ANTIBODY SCREEN: NEGATIVE

## 2016-03-31 LAB — RPR: RPR Ser Ql: NONREACTIVE

## 2016-03-31 MED ORDER — ACETAMINOPHEN 325 MG PO TABS: 650.0000 mg | ORAL_TABLET | ORAL | Status: DC | PRN

## 2016-03-31 MED ORDER — LACTATED RINGERS IV SOLN: 500.0000 mL | INTRAVENOUS | Status: DC | PRN

## 2016-03-31 MED ORDER — ONDANSETRON HCL 4 MG/2ML IJ SOLN: 4.0000 mg | Freq: Four times a day (QID) | INTRAMUSCULAR | Status: DC | PRN

## 2016-03-31 MED ORDER — SENNOSIDES-DOCUSATE SODIUM 8.6-50 MG PO TABS
2.0000 | ORAL_TABLET | ORAL | Status: DC
  Administered 2016-03-31 – 2016-04-01 (×2): 2 via ORAL
  Filled 2016-03-31 (×2): qty 2

## 2016-03-31 MED ORDER — ZOLPIDEM TARTRATE 5 MG PO TABS: 5.0000 mg | ORAL_TABLET | Freq: Every evening | ORAL | Status: DC | PRN

## 2016-03-31 MED ORDER — OXYTOCIN 10 UNIT/ML IJ SOLN
INTRAMUSCULAR | Status: AC
  Filled 2016-03-31: qty 1

## 2016-03-31 MED ORDER — DIPHENHYDRAMINE HCL 25 MG PO CAPS: 25.0000 mg | ORAL_CAPSULE | Freq: Four times a day (QID) | ORAL | Status: DC | PRN

## 2016-03-31 MED ORDER — LACTATED RINGERS IV SOLN
INTRAVENOUS | Status: DC
  Administered 2016-03-31: 03:00:00 via INTRAVENOUS

## 2016-03-31 MED ORDER — SODIUM CHLORIDE 0.9 % IV SOLN
2.0000 g | Freq: Four times a day (QID) | INTRAVENOUS | Status: DC
  Administered 2016-03-31: 2 g via INTRAVENOUS
  Filled 2016-03-31 (×2): qty 2000

## 2016-03-31 MED ORDER — WITCH HAZEL-GLYCERIN EX PADS: 1.0000 "application " | MEDICATED_PAD | CUTANEOUS | Status: DC | PRN

## 2016-03-31 MED ORDER — OXYTOCIN BOLUS FROM INFUSION
500.0000 mL | INTRAVENOUS | Status: DC
  Administered 2016-03-31: 666 m[IU]/min via INTRAVENOUS

## 2016-03-31 MED ORDER — SIMETHICONE 80 MG PO CHEW: 80.0000 mg | CHEWABLE_TABLET | ORAL | Status: DC | PRN

## 2016-03-31 MED ORDER — IBUPROFEN 600 MG PO TABS
600.0000 mg | ORAL_TABLET | Freq: Four times a day (QID) | ORAL | Status: DC
  Administered 2016-03-31 – 2016-04-02 (×10): 600 mg via ORAL
  Filled 2016-03-31 (×10): qty 1

## 2016-03-31 MED ORDER — FLEET ENEMA 7-19 GM/118ML RE ENEM: 1.0000 | ENEMA | RECTAL | Status: DC | PRN

## 2016-03-31 MED ORDER — DIBUCAINE 1 % RE OINT: 1.0000 "application " | TOPICAL_OINTMENT | RECTAL | Status: DC | PRN

## 2016-03-31 MED ORDER — OXYCODONE-ACETAMINOPHEN 5-325 MG PO TABS: 1.0000 | ORAL_TABLET | ORAL | Status: DC | PRN

## 2016-03-31 MED ORDER — ONDANSETRON HCL 4 MG PO TABS: 4.0000 mg | ORAL_TABLET | ORAL | Status: DC | PRN

## 2016-03-31 MED ORDER — SOD CITRATE-CITRIC ACID 500-334 MG/5ML PO SOLN: 30.0000 mL | ORAL | Status: DC | PRN

## 2016-03-31 MED ORDER — COCONUT OIL OIL
1.0000 "application " | TOPICAL_OIL | Status: DC | PRN
  Filled 2016-03-31: qty 120

## 2016-03-31 MED ORDER — OXYCODONE-ACETAMINOPHEN 5-325 MG PO TABS
2.0000 | ORAL_TABLET | ORAL | Status: DC | PRN
  Administered 2016-03-31: 2 via ORAL
  Filled 2016-03-31: qty 2

## 2016-03-31 MED ORDER — PRENATAL MULTIVITAMIN CH
1.0000 | ORAL_TABLET | Freq: Every day | ORAL | Status: DC
  Administered 2016-03-31 – 2016-04-02 (×3): 1 via ORAL
  Filled 2016-03-31 (×3): qty 1

## 2016-03-31 MED ORDER — LIDOCAINE HCL (PF) 1 % IJ SOLN
30.0000 mL | INTRAMUSCULAR | Status: DC | PRN
  Filled 2016-03-31: qty 30

## 2016-03-31 MED ORDER — OXYTOCIN 40 UNITS IN LACTATED RINGERS INFUSION - SIMPLE MED
INTRAVENOUS | Status: AC
  Administered 2016-03-31: 666 m[IU]/min via INTRAVENOUS
  Filled 2016-03-31: qty 1000

## 2016-03-31 MED ORDER — TETANUS-DIPHTH-ACELL PERTUSSIS 5-2.5-18.5 LF-MCG/0.5 IM SUSP: 0.5000 mL | Freq: Once | INTRAMUSCULAR | Status: DC

## 2016-03-31 MED ORDER — BENZOCAINE-MENTHOL 20-0.5 % EX AERO
1.0000 "application " | INHALATION_SPRAY | CUTANEOUS | Status: DC | PRN
  Filled 2016-03-31: qty 56

## 2016-03-31 MED ORDER — ONDANSETRON HCL 4 MG/2ML IJ SOLN: 4.0000 mg | INTRAMUSCULAR | Status: DC | PRN

## 2016-03-31 NOTE — H&P (Signed)
Michelle Cross is a 35 y.o. female [phone] @ 38.2 wks presenting for active labor with impending delivery. GBS pos. GDM on glyburide 3.75 BID History OB History    Gravida Para Term Preterm AB TAB SAB Ectopic Multiple Living   4 3 2 1      3      Past Medical History  Diagnosis Date  . Depression   . Anxiety   . Gestational diabetes     glyburide  . Preterm labor     delivery   Past Surgical History  Procedure Laterality Date  . Appendectomy    . Appendectomy     Family History: family history includes Diabetes in her father. There is no history of Anesthesia problems. Social History:  reports that she has never smoked. She has never used smokeless tobacco. She reports that she drinks about 0.6 oz of alcohol per week. She reports that she does not use illicit drugs.   Prenatal Transfer Tool  Maternal Diabetes: Yes:  Diabetes Type:  Insulin/Medication controlled Genetic Screening: Normal Maternal Ultrasounds/Referrals: Normal Fetal Ultrasounds or other Referrals:  None Maternal Substance Abuse:  No Significant Maternal Medications:  Meds include: Other: glyburide Significant Maternal Lab Results:  None Other Comments:  None  Review of Systems  Constitutional: Negative.   HENT: Negative.   Eyes: Negative.   Respiratory: Negative.   Cardiovascular: Negative.   Gastrointestinal: Positive for abdominal pain.  Genitourinary: Negative.   Musculoskeletal: Negative.   Skin: Negative.   Neurological: Negative.   Endo/Heme/Allergies: Negative.   Psychiatric/Behavioral: Negative.       Temperature 97.9 F (36.6 C), temperature source Oral, last menstrual period 07/07/2015. Maternal Exam:  Uterine Assessment: Contraction strength is firm.  Contraction frequency is regular.   Abdomen: Patient reports no abdominal tenderness. Fetal presentation: vertex  Introitus: Normal vagina.  Amniotic fluid character: clear.  Pelvis: adequate for delivery.   Cervix: Cervix  evaluated by digital exam.     Fetal Exam Fetal Monitor Review: Mode: ultrasound.   Variability: moderate (6-25 bpm).    Fetal State Assessment: Category I - tracings are normal.     Physical Exam  Constitutional: She is oriented to person, place, and time. She appears well-developed and well-nourished.  HENT:  Head: Normocephalic.  Eyes: Pupils are equal, round, and reactive to light.  Neck: Normal range of motion.  Cardiovascular: Normal rate, regular rhythm, normal heart sounds and intact distal pulses.   Respiratory: Effort normal and breath sounds normal.  GI: Soft. Bowel sounds are normal.  Genitourinary: Vagina normal and uterus normal.  Musculoskeletal: Normal range of motion.  Neurological: She is alert and oriented to person, place, and time. She has normal reflexes.  Skin: Skin is warm and dry.  Psychiatric: She has a normal mood and affect. Her behavior is normal. Judgment and thought content normal.    Prenatal labs: ABO, Rh: B/POS/-- (12/15 1029) Antibody: NEG (12/15 1029) Rubella: 2.25 (12/15 1029) RPR: NON REAC (04/26 0944)  HBsAg: NEGATIVE (12/15 1029)  HIV: NONREACTIVE (04/26 0944)  GBS: Positive (06/16 0000)   Assessment/Plan: Admit ,impending delivery   Wyvonnia DuskyMarie Lawson 03/31/2016, 3:38 AM

## 2016-03-31 NOTE — MAU Note (Signed)
PT  VERY UNCOMFORTABLE  - BROUGHT  FROM LOBBY-  FHR- 140.   VE 8 CM      TO RM 166   VIA  STRETCHER.

## 2016-03-31 NOTE — Lactation Note (Signed)
This note was copied from a baby's chart. Lactation Consultation Note  Eda Present for interpretation. P4, Ex BF 6 months.  Baby 7 hours old.  BS low. Unwrapped baby in crib and assisted w/ latching in football. Assisted mother w/ hand expression and a few drops were expressed.   Sucks and swallows observed off and on - baby sleepy at breast. Encouraged mother to massage/compress breast during feeding and do STS after feeding. Observed feeding for more than 10 min.  Recommend breastfeeding on both breasts. Discussed basics including depth and cluster feeding. Mom encouraged to feed baby 8-12 times/24 hours and with feeding cues.  Mom made aware of O/P services, breastfeeding support groups, community resources, and our phone # for post-discharge questions in Spanish.       Patient Name: Michelle Cross'UToday's Date: 03/31/2016 Reason for consult: Initial assessment   Maternal Data Has patient been taught Hand Expression?: Yes Does the patient have breastfeeding experience prior to this delivery?: Yes  Feeding Feeding Type: Breast Fed Length of feed: 10 min  LATCH Score/Interventions Latch: Grasps breast easily, tongue down, lips flanged, rhythmical sucking.  Audible Swallowing: A few with stimulation  Type of Nipple: Everted at rest and after stimulation  Comfort (Breast/Nipple): Soft / non-tender     Hold (Positioning): Assistance needed to correctly position infant at breast and maintain latch.  LATCH Score: 8  Lactation Tools Discussed/Used     Consult Status Consult Status: Follow-up Date: 04/01/16 Follow-up type: In-patient    Dahlia ByesBerkelhammer, Lyfe Monger [facility] 03/31/2016, 11:58 AM

## 2016-04-01 ENCOUNTER — Encounter (HOSPITAL_COMMUNITY): Payer: Self-pay | Admitting: *Deleted

## 2016-04-01 NOTE — Progress Notes (Signed)
Post Partum Day #1 Subjective: no complaints, up ad lib and tolerating PO; breastfeeding going well; desires IUD for contraception; infant being placed under biliblanket  Objective: Blood pressure 96/64, pulse 82, temperature 98.2 F (36.8 C), temperature source Oral, resp. rate 18, height 5\' 1"  (1.549 m), weight 75.297 kg (166 lb), last menstrual period 07/07/2015, SpO2 98 %, unknown if currently breastfeeding.  Physical Exam:  General: alert, cooperative and no distress Lochia: appropriate Uterine Fundus: firm DVT Evaluation: No evidence of DVT seen on physical exam.   Recent Labs  03/31/16 0325  HGB 15.4*  HCT 41.7    Assessment/Plan: Plan for discharge tomorrow   LOS: 1 day   SHAW, KIMBERLY CNM 04/01/2016, 9:05 AM

## 2016-04-01 NOTE — Lactation Note (Addendum)
This note was copied from a baby's chart. Lactation Consultation Note  Spanish interpreter present. P4, ExBF, Mother recently breastfed baby for 15 min. Denies problems or concerns. Encouraged her to call if she needs assistance. Assisted w/ putting back on phototherapy light.     Patient Name: Michelle Cross Reason for consult: Follow-up assessment   Maternal Data    Feeding Feeding Type: Breast Fed Length of feed: 15 min  LATCH Score/Interventions Latch: Grasps breast easily, tongue down, lips flanged, rhythmical sucking.  Audible Swallowing: Spontaneous and intermittent  Type of Nipple: Everted at rest and after stimulation  Comfort (Breast/Nipple): Filling, red/small blisters or bruises, mild/mod discomfort  Problem noted: Mild/Moderate discomfort Interventions (Mild/moderate discomfort):  (coconut oil given )  Hold (Positioning): No assistance needed to correctly position infant at breast.  LATCH Score: 9  Lactation Tools Discussed/Used     Consult Status Consult Status: Follow-up Date: 04/02/16 Follow-up type: In-patient    Dahlia ByesBerkelhammer, Nathania Waldman [facility] Cross, 4:01 PM

## 2016-04-01 NOTE — Progress Notes (Signed)
CSW acknowledged consult for hx of anxiety and depression.  CSW utilized hospital Spanish interpreter to assist with language barriers. CSW inquired about MOB's mental health, and MOB denied having anxiety and depression.  MOB communicated that she has never experienced PPD, and overall she was doing well. CSW encouraged MOB to contact her OBGYN office if she develops signs and symptoms for PPD.

## 2016-04-02 ENCOUNTER — Other Ambulatory Visit: Payer: Self-pay

## 2016-04-02 ENCOUNTER — Encounter: Payer: Self-pay | Admitting: Obstetrics and Gynecology

## 2016-04-02 ENCOUNTER — Ambulatory Visit: Payer: Self-pay

## 2016-04-02 LAB — GLUCOSE, RANDOM: Glucose, Bld: 100 mg/dL — ABNORMAL HIGH (ref 65–99)

## 2016-04-02 MED ORDER — IBUPROFEN 600 MG PO TABS
600.0000 mg | ORAL_TABLET | Freq: Four times a day (QID) | ORAL | Status: DC
Start: 2016-04-02 — End: 2017-08-16

## 2016-04-02 MED ORDER — ACETAMINOPHEN 325 MG PO TABS: 650.0000 mg | ORAL_TABLET | ORAL | Status: DC | PRN

## 2016-04-02 NOTE — Lactation Note (Signed)
This note was copied from a baby's chart. Lactation Consultation Note  Patient Name: Michelle Cross VWUJW'JToday's Date: 04/02/2016 Reason for consult: Follow-up assessment;Hyperbilirubinemia  Baby 60 hours old. Bonnye FavaViria, in-house Spanish interpreter, assisted with consultation. Mom states that she has been putting baby to breast first, and then supplementing with formula. Mom has used DEBP and has 1 ml of EBM at bedside. Discussed the benefits of EBM to lowering bilirubin levels. Also discussed that baby sleepy due to elevated bilirubin. Mom reports that she feels like she had a better breast milk supply with first 3 boys.   Enc mom to put baby to breast with each feeding--with cues and at least every 3 hours, then supplement with EBM/formula. Enc mom to increase supplementation amount to 30 ml--giving EBM first, and then making up the difference with formula. Demonstrated to mom how to give small amount of EBM with spoon and finger. Enc using bottle as EBM amounts increase. Enc mom to post-pump after baby fed, followed by hand expression. Discussed with mom that using the pump will protect her breast milk supply. Mom stated that she had no additional questions at this time. Discussed feeding plan with patient's bedside nurses, Marcelino DusterMichelle and PerryJennifer, RNs, while bedside reporting taking place at shift change.  Maternal Data    Feeding Feeding Type: Breast Milk Nipple Type: Slow - flow  LATCH Score/Interventions                      Lactation Tools Discussed/Used WIC Program: Yes Pump Review: Setup, frequency, and cleaning;Milk Storage Initiated by:: Bedside RN Date initiated:: 04/02/16   Consult Status Consult Status: Follow-up Date: 04/03/16 Follow-up type: In-patient    Geralynn OchsWILLIARD, Marti Acebo 04/02/2016, 3:33 PM

## 2016-04-02 NOTE — Discharge Summary (Signed)
OB Discharge Summary     Patient Name: Michelle Cross DOB: Aug 17, 1981 MRN: [id number]  Date of admission: 03/31/2016 Delivering MD: Wyvonnia DuskyLAWSON, MARIE D   Date of discharge: 04/02/2016  Admitting diagnosis: 38 WEEKS CTX ROM Intrauterine pregnancy: 1381w2d     Secondary diagnosis:  Active Problems:   Active labor   SVD (spontaneous vaginal delivery)  Additional problems: a2gdm     Discharge diagnosis: Term Pregnancy Delivered                                                                                                Post partum procedures:none  Augmentation: none  Complications: None  Hospital course:  Onset of Labor With Vaginal Delivery     35 y.o. yo [id number] at 2081w2d was admitted in Active Labor on 03/31/2016. Patient had an uncomplicated labor course as follows:  Membrane Rupture Time/Date: 2:25 AM ,03/31/2016   Intrapartum Procedures: Episiotomy: None [1]                                         Lacerations:  1st degree [2]  Patient had a delivery of a Viable infant. 03/31/2016  Information for the patient's newborn:  Michelle Cross, Michelle Cross [[id number]][[id number]]  Delivery Method: Vaginal, Spontaneous Delivery (Filed from Delivery Summary)    Pateint had an uncomplicated postpartum course.  She is ambulating, tolerating a regular diet, passing flatus, and urinating well. Patient is discharged home in stable condition on 04/02/2016.  ppd2 fasting glucose 100; will need 2-hour gtt 6-12 wks pp.   Physical exam  Filed Vitals:   03/31/16 1800 04/01/16 0517 04/01/16 1802 04/02/16 0610  BP: 116/75 96/64 117/72 111/70  Pulse: 65 82 80 67  Temp: 98.1 F (36.7 C) 98.2 F (36.8 C) 98.4 F (36.9 C) 98.2 F (36.8 C)  TempSrc: Oral Oral Oral Oral  Resp: 18 18 16 18   Height:      Weight:      SpO2:  98%     General: alert, cooperative and no distress Lochia: appropriate Uterine Fundus: firm Incision: N/A DVT Evaluation: No cords or calf tenderness. No significant  calf/ankle edema. Labs: Lab Results  Component Value Date   WBC 15.6* 03/31/2016   HGB 15.4* 03/31/2016   HCT 41.7 03/31/2016   MCV 85.8 03/31/2016   PLT 242 03/31/2016   CMP Latest Ref Rng 04/02/2016  Glucose 65 - 99 mg/dL 130(Q100(H)  BUN 6 - 23 mg/dL -  Creatinine 0.4 - 1.2 mg/dL -  Sodium 657135 - 846145 mEq/L -  Potassium 3.5 - 5.1 mEq/L -  Chloride 96 - 112 mEq/L -  CO2 19 - 32 mEq/L -  Calcium 8.4 - 10.5 mg/dL -  Total Protein 9.6-2.96.0-8.3 g/dL -  Total Bilirubin 5.2-8.40.3-1.2 mg/dL -  Alkaline Phos 13-24439-117 units/L -  AST 0-37 units/L -  ALT 0-35 units/L -    Discharge instruction: per After Visit Summary and "Baby and Me Booklet".  After visit meds:    Medication List  STOP taking these medications        famotidine 20 MG tablet  Commonly known as:  PEPCID     glyBURIDE 2.5 MG tablet  Commonly known as:  DIABETA      TAKE these medications        acetaminophen 325 MG tablet  Commonly known as:  TYLENOL  Take 2 tablets (650 mg total) by mouth every 4 (four) hours as needed (for pain scale < 4).     ibuprofen 600 MG tablet  Commonly known as:  ADVIL,MOTRIN  Take 1 tablet (600 mg total) by mouth every 6 (six) hours.     PRENATAL VITAMINS PO  Take 1 tablet by mouth daily.        Diet: routine diet  Activity: Advance as tolerated. Pelvic rest for 6 weeks.   Outpatient follow up:6 weeks Follow up Appt:No future appointments. Follow up Visit:No Follow-up on file.  Postpartum contraception: iud  Newborn Data: Live born female  Birth Weight: 9 lb 6.3 oz (4260 g) APGAR: 8, 9  Baby Feeding: Breast Disposition:home with mother   04/02/2016 Michelle BilisNoah B Stiles Maxcy, MD

## 2016-04-02 NOTE — Discharge Instructions (Signed)
Parto vaginal, Cuidados posteriores  °(Vaginal Delivery, Care After) °Siga estas instrucciones durante las próximas semanas. Estas indicaciones para el alta le proporcionan información general acerca de cómo deberá cuidarse después del parto. El médico también podrá darle instrucciones específicas. El tratamiento ha sido planificado según las prácticas médicas actuales, pero en algunos casos pueden ocurrir problemas. Comuníquese con el médico si tiene algún problema o tiene preguntas al volver a su casa.  °INSTRUCCIONES PARA EL CUIDADO EN EL HOGAR  °· Tome sólo medicamentos de venta libre o recetados, según las indicaciones del médico o del farmacéutico. °· No beba alcohol, especialmente si está amamantando o toma analgésicos. °· No mastique tabaco ni fume. °· No consuma drogas. °· Continúe con un adecuado cuidado perineal. El buen cuidado perineal incluye: °¨ Higienizarse de adelante hacia atrás. °¨ Mantener la zona perineal limpia. °· No use tampones ni duchas vaginales hasta que su médico la autorice. °· Dúchese, lávese el cabello y tome baños de inmersión según las indicaciones de su médico. °· Utilice un sostén que le ajuste bien y que brinde buen soporte a sus mamas. °· Consuma alimentos saludables. °· Beba suficiente líquido para mantener la orina clara o de color amarillo pálido. °· Consuma alimentos ricos en fibra como cereales y panes integrales, arroz, frijoles y frutas y verduras frescas todos los días. Estos alimentos pueden ayudarla a prevenir o aliviar el estreñimiento. °· Siga las recomendaciones de su médico relacionadas con la reanudación de actividades como subir escaleras, conducir automóviles, levantar objetos, hacer ejercicios o viajar. °· Hable con su médico acerca de reanudar la actividad sexual. Volver a la actividad sexual depende del riesgo de infección, la velocidad de la curación y la comodidad y su deseo de reanudarla. °· Trate de que alguien la ayude con las actividades del hogar y con  el recién nacido al menos durante un par de días después de salir del hospital. °· Descanse todo lo que pueda. Trate de descansar o tomar una siesta mientras el bebé está durmiendo. °· Aumente sus actividades gradualmente. °· Cumpla con todas las visitas de control programadas para después del parto. Es muy importante asistir a todas las citas programadas de seguimiento. En estas citas, su médico va a controlarla para asegurarse de que esté sanando física y emocionalmente. °SOLICITE ATENCIÓN MÉDICA SI:  °· Elimina coágulos grandes por la vagina. Guarde algunos coágulos para mostrarle al médico. °· Tiene una secreción con feo olor que proviene de la vagina. °· Tiene dificultad para orinar. °· Orina con frecuencia. °· Siente dolor al orinar. °· Nota un cambio en sus movimientos intestinales. °· Aumenta el enrojecimiento, el dolor o la hinchazón en la zona de la incisión vaginal (episiotomía) o el desgarro vaginal. °· Tiene pus que drena por la episiotomía o el desgarro vaginal. °· La episiotomía o el desgarro vaginal se abren. °· Sus mamas le duelen, están duras o enrojecidas. °· Sufre un dolor intenso de cabeza. °· Tiene visión borrosa o ve manchas. °· Se siente triste o deprimida. °· Tiene pensamientos acerca de lastimarse o dañar al recién nacido. °· Tiene preguntas acerca de su cuidado personal, el cuidado del recién nacido o acerca de los medicamentos. °· Se siente mareada o sufre un desmayo. °· Tiene una erupción. °· Tiene náuseas o vómitos. °· Usted amamantó al bebé y no ha tenido su período menstrual dentro de las 12 semanas después de dejar de amamantar. °· No amamanta al bebé y no tuvo su período menstrual en las últimas 12° semanas después del   parto. °· Tiene fiebre. °SOLICITE ATENCIÓN MÉDICA DE INMEDIATO SI:  °· Siente dolor persistente. °· Siente dolor en el pecho. °· Le falta el aire. °· Se desmaya. °· Siente dolor en la pierna. °· Siente dolor en el estómago. °· El sangrado vaginal satura dos o más  apósitos en 1 hora. °  °Esta información no tiene como fin reemplazar el consejo del médico. Asegúrese de hacerle al médico cualquier pregunta que tenga. °  °Document Released: 09/15/2005 Document Revised: 06/06/2015 °Elsevier Interactive Patient Education ©2016 Elsevier Inc. ° °

## 2016-04-03 ENCOUNTER — Encounter: Payer: Self-pay | Admitting: Obstetrics & Gynecology

## 2016-04-05 ENCOUNTER — Inpatient Hospital Stay (HOSPITAL_COMMUNITY): Payer: Self-pay

## 2017-08-16 ENCOUNTER — Encounter (HOSPITAL_COMMUNITY): Payer: Self-pay

## 2017-08-16 ENCOUNTER — Emergency Department (HOSPITAL_COMMUNITY): Payer: Self-pay

## 2017-08-16 ENCOUNTER — Emergency Department (HOSPITAL_COMMUNITY)
Admission: EM | Admit: 2017-08-16 | Discharge: 2017-08-16 | Disposition: A | Discharge status: Home / Self Care | Payer: Self-pay | Attending: Emergency Medicine | Admitting: Emergency Medicine

## 2017-08-16 DIAGNOSIS — Z79899 Other long term (current) drug therapy: Secondary | ICD-10-CM | POA: Insufficient documentation

## 2017-08-16 DIAGNOSIS — M5431 Sciatica, right side: Secondary | ICD-10-CM | POA: Insufficient documentation

## 2017-08-16 LAB — URINALYSIS, ROUTINE W REFLEX MICROSCOPIC
BILIRUBIN URINE: NEGATIVE
Glucose, UA: 100 mg/dL — AB
Ketones, ur: NEGATIVE mg/dL
NITRITE: NEGATIVE
PROTEIN: NEGATIVE mg/dL
SPECIFIC GRAVITY, URINE: 1.01 (ref 1.005–1.030)
pH: 7 (ref 5.0–8.0)

## 2017-08-16 LAB — PREGNANCY, URINE: PREG TEST UR: NEGATIVE

## 2017-08-16 LAB — URINALYSIS, MICROSCOPIC (REFLEX)

## 2017-08-16 MED ORDER — HYDROCODONE-ACETAMINOPHEN 5-325 MG PO TABS
1.0000 | ORAL_TABLET | Freq: Once | ORAL | Status: AC
  Administered 2017-08-16: 1 via ORAL
  Filled 2017-08-16: qty 1

## 2017-08-16 MED ORDER — IBUPROFEN 600 MG PO TABS: 600.0000 mg | ORAL_TABLET | Freq: Four times a day (QID) | ORAL | 0 refills | Status: DC | PRN

## 2017-08-16 MED ORDER — CYCLOBENZAPRINE HCL 10 MG PO TABS: 10.0000 mg | ORAL_TABLET | Freq: Two times a day (BID) | ORAL | 0 refills | Status: DC | PRN

## 2017-08-16 MED ORDER — DEXAMETHASONE SODIUM PHOSPHATE 10 MG/ML IJ SOLN
10.0000 mg | Freq: Once | INTRAMUSCULAR | Status: AC
  Administered 2017-08-16: 10 mg via INTRAMUSCULAR
  Filled 2017-08-16: qty 1

## 2017-08-16 MED ORDER — IBUPROFEN 400 MG PO TABS
600.0000 mg | ORAL_TABLET | Freq: Once | ORAL | Status: AC
  Administered 2017-08-16: 600 mg via ORAL
  Filled 2017-08-16: qty 1

## 2017-08-16 NOTE — Discharge Instructions (Signed)
Take Ibuprofen three times daily for the next week. Take this medicine with food. °Take muscle relaxer at bedtime to help you sleep. This medicine makes you drowsy so do not take before driving or work °Use a heating pad for sore muscles - use for 20 minutes several times a day °Return for worsening symptoms ° °

## 2017-08-16 NOTE — ED Triage Notes (Signed)
Onset yesterday right lower back pain radiating to right buttock, right posterior thigh, and lower abd.    Pt had episode 3 weeks of same.  No vaginal discharge, urinary symptoms, or fever.  No known injuries.

## 2017-08-16 NOTE — ED Provider Notes (Signed)
[facility] Provider Note   CSN: [id number] Arrival date & time: 08/16/17  1127     History   Chief Complaint Chief Complaint  Patient presents with  . Back Pain  . Abdominal Pain    HPI Michelle Cross is a 36 y.o. female who presents with low back pain. PMH significant for anxiety/depression. Past surgical hx significant for appendectomy. She states that she has had intermittent low back pain for the past several years. It was originally due to an injury when she lifted a heavy objected ~5 years ago. Usually the pain gets better within a day. This time the pain started yesterday when she was sitting. It is more severe in nature and is not going away. The pain is over the lower back and shoots down the right buttocks to the right foot intermittently and feels like cramping. It also radiates to the right lower abdomen which feels more like a pressure. She has been taking a pain medicine from [place] but it hasn't been helping. She denies fever, chills, vomiting, diarrhea, urinary symptoms, vaginal discharge. She is having difficulty getting in a comfortable position and feels weak when she walks but is able to walk. No syncope, recent trauma, unexplained weight loss, hx of cancer, loss of bowel/bladder function, saddle anesthesia, urinary retention, IVDU.  A Spanish interpreter was used  HPI  Past Medical History:  Diagnosis Date  . Anxiety   . Depression   . Gestational diabetes    glyburide  . Preterm labor    delivery    Patient Active Problem List   Diagnosis Date Noted  . Active labor 03/31/2016  . SVD (spontaneous vaginal delivery) 03/31/2016  . Group B Streptococcus carrier, +RV culture, currently pregnant 03/13/2016  . GDM, class A2 01/28/2016  . History of preterm delivery, currently pregnant 09/13/2015  . Supervision of high risk pregnancy, antepartum 09/13/2015    Past Surgical History:  Procedure Laterality Date  .  APPENDECTOMY    . APPENDECTOMY      OB History    Gravida Para Term Preterm AB Living   4 4 3 1   4    SAB TAB Ectopic Multiple Live Births         0 4       Home Medications    Prior to Admission medications   Medication Sig Start Date End Date Taking? Authorizing Provider  acetaminophen (TYLENOL) 325 MG tablet Take 2 tablets (650 mg total) by mouth every 4 (four) hours as needed (for pain scale < 4). 04/02/16   Wouk, Wilfred CurtisNoah Bedford, MD  ibuprofen (ADVIL,MOTRIN) 600 MG tablet Take 1 tablet (600 mg total) by mouth every 6 (six) hours. 04/02/16   Wouk, Wilfred CurtisNoah Bedford, MD  Prenatal Multivit-Min-Fe-FA (PRENATAL VITAMINS PO) Take 1 tablet by mouth daily.     [provider]    Family History Family History  Problem Relation Age of Onset  . Diabetes Father   . Anesthesia problems Neg Hx     Social History Social History   Tobacco Use  . Smoking status: Never Smoker  . Smokeless tobacco: Never Used  Substance Use Topics  . Alcohol use: Yes    Alcohol/week: 0.6 oz    Types: 1 Glasses of wine per week    Comment: occasional  . Drug use: No     Allergies   Patient has no known allergies.   Review of Systems Review of Systems  Constitutional: Negative for fever.  Respiratory:  Negative for shortness of breath.   Cardiovascular: Negative for chest pain.  Gastrointestinal: Positive for abdominal pain. Negative for diarrhea, nausea and vomiting.  Genitourinary: Negative for difficulty urinating, dysuria and vaginal discharge.  Musculoskeletal: Positive for back pain and myalgias. Negative for gait problem.  Neurological: Positive for weakness. Negative for numbness.  All other systems reviewed and are negative.    Physical Exam Updated Vital Signs BP 113/73 (BP Location: Right Arm)   Pulse 85   Temp 98.1 F (36.7 C) (Oral)   Resp 16   LMP 07/30/2017   SpO2 99%   Physical Exam  Constitutional: She is oriented to person, place, and time. She appears  well-developed and well-nourished. She appears distressed (mildly due to pain).  HENT:  Head: Normocephalic and atraumatic.  Eyes: Conjunctivae are normal. Pupils are equal, round, and reactive to light. Right eye exhibits no discharge. Left eye exhibits no discharge. No scleral icterus.  Neck: Normal range of motion.  Cardiovascular: Normal rate and regular rhythm. Exam reveals no gallop and no friction rub.  No murmur heard. Pulmonary/Chest: Effort normal and breath sounds normal. No stridor. No respiratory distress. She has no wheezes. She has no rales. She exhibits no tenderness.  Abdominal: Soft. Bowel sounds are normal. She exhibits no distension and no mass. There is no tenderness. There is no guarding.  Musculoskeletal:  Back: Inspection: No masses, deformity, or rash Palpation: Lumbar midline spinal tenderness. Right sided lumbar paraspinal muscle tenderness with tenderness in the right buttocs Strength: 5/5 in lower extremities and normal plantar and dorsiflexion Sensation: Intact sensation with light touch in lower extremities bilaterally SLR: Negative seated straight leg raise Gait: Antalgic gait   Neurological: She is alert and oriented to person, place, and time.  Skin: Skin is warm and dry.  Psychiatric: She has a normal mood and affect. Her behavior is normal.  Nursing note and vitals reviewed.    ED Treatments / Results  Labs (all labs ordered are listed, but only abnormal results are displayed) Labs Reviewed  URINALYSIS, ROUTINE W REFLEX MICROSCOPIC - Abnormal; Notable for the following components:      Result Value   Glucose, UA 100 (*)    Hgb urine dipstick TRACE (*)    Leukocytes, UA TRACE (*)    All other components within normal limits  URINALYSIS, MICROSCOPIC (REFLEX) - Abnormal; Notable for the following components:   Bacteria, UA RARE (*)    Squamous Epithelial / LPF 6-30 (*)    All other components within normal limits  PREGNANCY, URINE    EKG   EKG Interpretation None       Radiology No results found.  Procedures Procedures (including critical care time)  Medications Ordered in ED Medications  HYDROcodone-acetaminophen (NORCO/VICODIN) 5-325 MG per tablet 1 tablet (1 tablet Oral Given 08/16/17 1524)  ibuprofen (ADVIL,MOTRIN) tablet 600 mg (600 mg Oral Given 08/16/17 1524)  dexamethasone (DECADRON) injection 10 mg (10 mg Intramuscular Given 08/16/17 1524)     Initial Impression / Assessment and Plan / ED Course  I have reviewed the triage vital signs and the nursing notes.  Pertinent labs & imaging results that were available during my care of the patient were reviewed by me and considered in my medical decision making (see chart for details).  36 year old female with symptoms consistent with right sided sciatica. Vitals are normal. Low concern for infection or neurosurgical emergency considering this is atraumatic and this pain is acute on chronic. She has no abdominal tenderness  on exam - most likely this is radiating from the back. She was given pain medicine in the ED. Xray is normal. UA has glucose, trace leukocytes, and trace hgb. Preg test is negative. She will be given rx for NSAIDs and muscle relaxers. She was advised to f/u with Orthopedics.  Final Clinical Impressions(s) / ED Diagnoses   Final diagnoses:  Right sided sciatica    ED Discharge Orders    None       Bethel BornGekas, Broghan Pannone Marie, PA-C 08/16/17 1644    Margarita Grizzleay, Danielle, MD 08/17/17 [id number]

## 2018-09-29 NOTE — L&D Delivery Note (Signed)
Michelle Cross is a 38 y.o. female [phone] with IUP at [redacted]w[redacted]d admitted for IOL for A2GDM on insulin.  She progressed rapidly with Pitocin augmentation to complete and pushed one time to deliver.  Cord clamping delayed by several minutes then clamped by CNM and cut by FOB.    Delivery Note At 12:45 PM a viable female was delivered via Vaginal, Spontaneous (Presentation: LOA with compound hand).  APGAR: 9, 9; weight pending.   Placenta status: spontaneous, intact.  Cord: 3 vessels with the following complications: body cord x1.   Anesthesia:  none Episiotomy: None Lacerations:  1st degree perineal, hemostatic Suture Repair: n/a Est. Blood Loss (mL):  100  Mom to postpartum.  Baby to Couplet care / Skin to Skin.  Wende Mott CNM 04/23/2019, 12:58 PM

## 2018-10-04 ENCOUNTER — Telehealth: Payer: Self-pay | Admitting: Family Medicine

## 2018-10-04 NOTE — Telephone Encounter (Signed)
Lelandra patient coordinator for the adoptive mom program. Called in to verify the appt.

## 2018-11-18 ENCOUNTER — Encounter: Payer: Self-pay | Admitting: Obstetrics and Gynecology

## 2018-12-07 ENCOUNTER — Other Ambulatory Visit: Payer: Self-pay

## 2018-12-07 ENCOUNTER — Ambulatory Visit (INDEPENDENT_AMBULATORY_CARE_PROVIDER_SITE_OTHER): Payer: Self-pay | Admitting: *Deleted

## 2018-12-07 ENCOUNTER — Encounter: Payer: Self-pay | Admitting: *Deleted

## 2018-12-07 VITALS — BP 112/70 | HR 88 | Wt 155.5 lb

## 2018-12-07 DIAGNOSIS — Z8632 Personal history of gestational diabetes: Secondary | ICD-10-CM

## 2018-12-07 DIAGNOSIS — Z789 Other specified health status: Secondary | ICD-10-CM

## 2018-12-07 DIAGNOSIS — O09219 Supervision of pregnancy with history of pre-term labor, unspecified trimester: Secondary | ICD-10-CM

## 2018-12-07 DIAGNOSIS — O09299 Supervision of pregnancy with other poor reproductive or obstetric history, unspecified trimester: Secondary | ICD-10-CM

## 2018-12-07 DIAGNOSIS — O09899 Supervision of other high risk pregnancies, unspecified trimester: Secondary | ICD-10-CM

## 2018-12-07 DIAGNOSIS — O0990 Supervision of high risk pregnancy, unspecified, unspecified trimester: Secondary | ICD-10-CM

## 2018-12-07 DIAGNOSIS — Z113 Encounter for screening for infections with a predominantly sexual mode of transmission: Secondary | ICD-10-CM

## 2018-12-07 DIAGNOSIS — O09522 Supervision of elderly multigravida, second trimester: Secondary | ICD-10-CM

## 2018-12-07 LAB — POCT URINALYSIS DIP (DEVICE)
BILIRUBIN URINE: NEGATIVE
Glucose, UA: 100 mg/dL — AB
LEUKOCYTE UA: NEGATIVE
NITRITE: NEGATIVE
Protein, ur: NEGATIVE mg/dL
SPECIFIC GRAVITY, URINE: 1.01 (ref 1.005–1.030)
Urobilinogen, UA: 0.2 mg/dL (ref 0.0–1.0)
pH: 5.5 (ref 5.0–8.0)

## 2018-12-07 NOTE — Progress Notes (Signed)
Anatomy US 12/16/18 @ 1315.  Pt notified.

## 2018-12-07 NOTE — Progress Notes (Signed)
Interpreter Karl Luke present for encounter. New Ob intake completed and pregnancy information packet (Spanish) given. Labs done and [place] for anatomy scheduled. Since pt does not have insurance, she was advised that she can receive flu vaccine @ [facility] per sliding scale fee. Pt reports no problems. Initial prenatal visit scheduled on 12/14/18.

## 2018-12-08 DIAGNOSIS — O09219 Supervision of pregnancy with history of pre-term labor, unspecified trimester: Secondary | ICD-10-CM

## 2018-12-08 DIAGNOSIS — O0990 Supervision of high risk pregnancy, unspecified, unspecified trimester: Secondary | ICD-10-CM | POA: Insufficient documentation

## 2018-12-08 DIAGNOSIS — O09899 Supervision of other high risk pregnancies, unspecified trimester: Secondary | ICD-10-CM | POA: Insufficient documentation

## 2018-12-08 DIAGNOSIS — O09529 Supervision of elderly multigravida, unspecified trimester: Secondary | ICD-10-CM | POA: Insufficient documentation

## 2018-12-08 DIAGNOSIS — Z789 Other specified health status: Secondary | ICD-10-CM | POA: Insufficient documentation

## 2018-12-08 DIAGNOSIS — Z8632 Personal history of gestational diabetes: Secondary | ICD-10-CM

## 2018-12-08 DIAGNOSIS — O09299 Supervision of pregnancy with other poor reproductive or obstetric history, unspecified trimester: Secondary | ICD-10-CM | POA: Insufficient documentation

## 2018-12-08 LAB — OBSTETRIC PANEL, INCLUDING HIV
Antibody Screen: NEGATIVE
BASOS ABS: 0 10*3/uL (ref 0.0–0.2)
Basos: 0 %
EOS (ABSOLUTE): 0.1 10*3/uL (ref 0.0–0.4)
Eos: 0 %
HEMATOCRIT: 35.3 % (ref 34.0–46.6)
HEMOGLOBIN: 12.5 g/dL (ref 11.1–15.9)
HIV Screen 4th Generation wRfx: NONREACTIVE
Hepatitis B Surface Ag: NEGATIVE
IMMATURE GRANS (ABS): 0.1 10*3/uL (ref 0.0–0.1)
IMMATURE GRANULOCYTES: 1 %
LYMPHS: 23 %
Lymphocytes Absolute: 2.5 10*3/uL (ref 0.7–3.1)
MCH: 31.9 pg (ref 26.6–33.0)
MCHC: 35.4 g/dL (ref 31.5–35.7)
MCV: 90 fL (ref 79–97)
MONOS ABS: 0.6 10*3/uL (ref 0.1–0.9)
Monocytes: 5 %
Neutrophils Absolute: 7.9 10*3/uL — ABNORMAL HIGH (ref 1.4–7.0)
Neutrophils: 71 %
Platelets: 228 10*3/uL (ref 150–450)
RBC: 3.92 x10E6/uL (ref 3.77–5.28)
RDW: 13.1 % (ref 11.7–15.4)
RPR Ser Ql: NONREACTIVE
Rh Factor: POSITIVE
Rubella Antibodies, IGG: 1.58 index (ref >0.99)
WBC: 11.2 10*3/uL — ABNORMAL HIGH (ref 3.4–10.8)

## 2018-12-09 LAB — GC/CHLAMYDIA PROBE AMP (~~LOC~~) NOT AT ARMC
Chlamydia: NEGATIVE
Neisseria Gonorrhea: NEGATIVE

## 2018-12-09 LAB — URINE CULTURE, OB REFLEX

## 2018-12-09 LAB — CULTURE, OB URINE

## 2018-12-13 ENCOUNTER — Telehealth: Payer: Self-pay | Admitting: Family Medicine

## 2018-12-13 NOTE — Telephone Encounter (Signed)
Called patient to inform her of the office restrictions due to the coronavirus. No answer, left detailed message with the above information.

## 2018-12-14 ENCOUNTER — Other Ambulatory Visit: Payer: Self-pay

## 2018-12-14 ENCOUNTER — Encounter: Payer: Self-pay | Admitting: Student

## 2018-12-14 ENCOUNTER — Ambulatory Visit (INDEPENDENT_AMBULATORY_CARE_PROVIDER_SITE_OTHER): Payer: Self-pay | Admitting: Student

## 2018-12-14 ENCOUNTER — Telehealth: Payer: Self-pay

## 2018-12-14 VITALS — BP 110/70 | HR 79 | Wt 155.5 lb

## 2018-12-14 DIAGNOSIS — Z3A20 20 weeks gestation of pregnancy: Secondary | ICD-10-CM

## 2018-12-14 DIAGNOSIS — Z124 Encounter for screening for malignant neoplasm of cervix: Secondary | ICD-10-CM

## 2018-12-14 DIAGNOSIS — O0990 Supervision of high risk pregnancy, unspecified, unspecified trimester: Secondary | ICD-10-CM

## 2018-12-14 DIAGNOSIS — O0991 Supervision of high risk pregnancy, unspecified, first trimester: Secondary | ICD-10-CM

## 2018-12-14 DIAGNOSIS — Z1151 Encounter for screening for human papillomavirus (HPV): Secondary | ICD-10-CM

## 2018-12-14 LAB — GLUCOSE, CAPILLARY: Glucose-Capillary: 165 mg/dL — ABNORMAL HIGH (ref 70–99)

## 2018-12-14 MED ORDER — ASPIRIN EC 81 MG PO TBEC: 81.0000 mg | DELAYED_RELEASE_TABLET | Freq: Every day | ORAL | 7 refills | Status: AC

## 2018-12-14 NOTE — Progress Notes (Signed)
  Subjective:    Michelle Cross is being seen today for her first obstetrical visit.  This is a planned pregnancy. She is at [redacted]w[redacted]d gestation. Her obstetrical history is significant for GDMA2 on glyburide with her first pregnancy and  Gestatioanl diabetes diet controlled.  in her last three of her pregnancies. She also had PROM and  PTD at 36 weeks in 2007; she then had 3 subsequent term deliveries. She did not have 17P in the past. Relationship with FOB: spouse, living together. Patient does intend to breast feed. Pregnancy history fully reviewed.  Patient reports fatigue and backache and pelvic pain. She says that this is normal for pregnancy; denies dysuria, abnormal discharge or other ob-gyn complaints.  Review of Systems:   Review of Systems  Constitutional: Negative.   HENT: Negative.   Genitourinary: Negative.   Musculoskeletal: Negative.   Neurological: Negative.     Objective:     BP 110/70   Pulse 79   Wt 155 lb 8 oz (70.5 kg)   LMP 07/23/2018 (Exact Date)   BMI 29.38 kg/m  Physical Exam  Constitutional: She is oriented to person, place, and time. She appears well-developed.  HENT:  Head: Normocephalic.  Neck: Normal range of motion.  Respiratory: Effort normal.  GI: Soft.  Musculoskeletal: Normal range of motion.  Neurological: She is alert and oriented to person, place, and time.  Skin: Skin is warm and dry.    Exam    Assessment:    Pregnancy: [id number] Patient Active Problem List   Diagnosis Date Noted  . Supervision of high risk pregnancy, antepartum 12/08/2018  . History of preterm delivery, currently pregnant 12/08/2018  . History of gestational diabetes in prior pregnancy, currently pregnant 12/08/2018  . Language barrier 12/08/2018  . Multigravida of advanced maternal age 15/07/2019       Plan:     Initial labs drawn. Prenatal vitamins. Problem list reviewed and updated. AFP3 discussed: declined. Role of ultrasound in pregnancy discussed;  fetal survey: ordered for Thursday. Amniocentesis discussed: declined. . Follow up in 4 weeks. 50% of 60 min visit spent on counseling and coordination of care.  -Return in 4 weeks -Pap smear, Hgba1c, 1 hour done today.  -[place] on 3/19 -baby ASA  -17P application will be submitted by CMA.  -Finger stick glucose today was 166; this was 5 minutes after drinking glucola drink.  Charlesetta Garibaldi [facility] 12/14/2018

## 2018-12-14 NOTE — Telephone Encounter (Signed)
Called pt to advise is needing signature for [facility] application, used [employer] [place] id# [phone], no answer, Inter. Left message for pt to come back to sign. If she does not return today ,she has a Nurse visit on 12/16/18, will get her to sign then.

## 2018-12-15 LAB — GLUCOSE TOLERANCE, 1 HOUR: GLUCOSE, 1HR PP: 247 mg/dL — AB (ref 65–199)

## 2018-12-15 LAB — HEMOGLOBIN A1C
Est. average glucose Bld gHb Est-mCnc: 105 mg/dL
HEMOGLOBIN A1C: 5.3 % (ref 4.8–5.6)

## 2018-12-16 ENCOUNTER — Other Ambulatory Visit: Payer: Self-pay

## 2018-12-16 ENCOUNTER — Encounter (HOSPITAL_COMMUNITY): Payer: Self-pay

## 2018-12-16 ENCOUNTER — Ambulatory Visit (HOSPITAL_COMMUNITY)
Admission: RE | Admit: 2018-12-16 | Discharge: 2018-12-16 | Disposition: A | Discharge status: Home / Self Care | Payer: Self-pay | Source: Ambulatory Visit | Attending: Student | Admitting: Student

## 2018-12-16 ENCOUNTER — Ambulatory Visit (HOSPITAL_COMMUNITY): Payer: Self-pay | Admitting: *Deleted

## 2018-12-16 VITALS — BP 116/71 | HR 90 | Temp 98.7°F | Wt 154.2 lb

## 2018-12-16 DIAGNOSIS — O09292 Supervision of pregnancy with other poor reproductive or obstetric history, second trimester: Secondary | ICD-10-CM

## 2018-12-16 DIAGNOSIS — O09212 Supervision of pregnancy with history of pre-term labor, second trimester: Secondary | ICD-10-CM

## 2018-12-16 DIAGNOSIS — Z363 Encounter for antenatal screening for malformations: Secondary | ICD-10-CM

## 2018-12-16 DIAGNOSIS — O09523 Supervision of elderly multigravida, third trimester: Secondary | ICD-10-CM

## 2018-12-16 DIAGNOSIS — O0990 Supervision of high risk pregnancy, unspecified, unspecified trimester: Secondary | ICD-10-CM | POA: Insufficient documentation

## 2018-12-16 DIAGNOSIS — Z3A20 20 weeks gestation of pregnancy: Secondary | ICD-10-CM

## 2018-12-16 DIAGNOSIS — O09522 Supervision of elderly multigravida, second trimester: Secondary | ICD-10-CM | POA: Insufficient documentation

## 2018-12-17 ENCOUNTER — Other Ambulatory Visit (HOSPITAL_COMMUNITY): Payer: Self-pay | Admitting: *Deleted

## 2018-12-17 ENCOUNTER — Encounter: Payer: Self-pay | Admitting: Student

## 2018-12-17 DIAGNOSIS — O09522 Supervision of elderly multigravida, second trimester: Secondary | ICD-10-CM

## 2018-12-17 LAB — CYTOLOGY - PAP
DIAGNOSIS: NEGATIVE
Diagnosis: REACTIVE
HPV: NOT DETECTED

## 2018-12-20 ENCOUNTER — Telehealth: Payer: Self-pay

## 2018-12-20 NOTE — Telephone Encounter (Signed)
Called Pt to see when she can come in & sign Makena Form so it can be faxed asap. Used [employer] ID# [phone], No answer ,left VM.

## 2018-12-21 ENCOUNTER — Telehealth: Payer: Self-pay

## 2018-12-21 NOTE — Telephone Encounter (Addendum)
-----  Message from [place], [place] sent at 12/21/2018  8:30 AM EDT ----- This patient has gestational diabetes; please call her and let her know, and order her test strips, test kit, etc plus an appt with Bev. Thank you so much!  Maye Hides  Notified pt with Spanish Interpreter Eda R. Provider recommendation.  Pt stated that she will be able to come in 12/23/18 @ 1400.  [place] office notified.

## 2018-12-23 ENCOUNTER — Encounter: Payer: Self-pay | Attending: Obstetrics & Gynecology | Admitting: *Deleted

## 2018-12-23 ENCOUNTER — Other Ambulatory Visit: Payer: Self-pay

## 2018-12-23 ENCOUNTER — Ambulatory Visit: Payer: Self-pay | Admitting: *Deleted

## 2018-12-23 DIAGNOSIS — Z3A00 Weeks of gestation of pregnancy not specified: Secondary | ICD-10-CM | POA: Insufficient documentation

## 2018-12-23 DIAGNOSIS — O24419 Gestational diabetes mellitus in pregnancy, unspecified control: Secondary | ICD-10-CM | POA: Insufficient documentation

## 2018-12-23 NOTE — Progress Notes (Signed)
  Patient was seen on 12/23/2018 for Gestational Diabetes self-management. EDD 04/29/2019. Patient speaks Spanish, live interpretor here for this visit. Patient states history of GDM with all previous pregnancies. She has purchased a [employer] meter and supplies but had to pay cash. She does not have insurance, so I will provide her a meter with supplies for duration of her pregnancy. Diet history obtained. Patient eats good variety of all food groups. Beverages include water with occasional diet soda.  The following learning objectives were met by the patient :   States the definition of Gestational Diabetes  States why dietary management is important in controlling blood glucose  Describes the effects of carbohydrates on blood glucose levels  Demonstrates ability to create a balanced meal plan  Demonstrates carbohydrate counting   States when to check blood glucose levels  Demonstrates proper blood glucose monitoring techniques  States the effect of stress and exercise on blood glucose levels  States the importance of limiting caffeine and abstaining from alcohol and smoking  Plan:  Aim for 3 Carb Choices per meal (45 grams) +/- 1 either way  Aim for 1-2 Carbs per snack Begin reading food labels for Total Carbohydrate of foods If OK with your MD, consider  increasing your activity level by walking, Arm Chair Exercises or other activity daily as tolerated Begin checking BG before breakfast and 2 hours after first bite of breakfast, lunch and dinner as directed by MD  Bring Log Book/Sheet and meter to every medical appointment OR use Baby Scripts (see below) Baby Scripts:  Patient not appropriate for Baby Scripts due to language barrier  Take medication if directed by MD  Blood glucose monitor given: True Track Lot # [id number] Exp: 04/28/2020 Blood glucose reading: 143 mg/dl after breakfast  Patient instructed to monitor glucose levels: FBS: 60 - 95 mg/dl 2 hour: <120 mg/dl  Patient  received the following handouts: in Spanish  Nutrition Diabetes and Pregnancy  Carbohydrate Counting List  BG Log Sheet  Patient will be seen for follow-up as needed.

## 2019-01-11 ENCOUNTER — Other Ambulatory Visit: Payer: Self-pay

## 2019-01-11 ENCOUNTER — Encounter: Payer: Self-pay | Admitting: Student

## 2019-01-11 ENCOUNTER — Ambulatory Visit (INDEPENDENT_AMBULATORY_CARE_PROVIDER_SITE_OTHER): Payer: Self-pay | Admitting: Obstetrics & Gynecology

## 2019-01-11 VITALS — BP 105/68 | HR 86 | Temp 98.7°F | Wt 155.7 lb

## 2019-01-11 DIAGNOSIS — O24419 Gestational diabetes mellitus in pregnancy, unspecified control: Secondary | ICD-10-CM

## 2019-01-11 DIAGNOSIS — O0990 Supervision of high risk pregnancy, unspecified, unspecified trimester: Secondary | ICD-10-CM

## 2019-01-11 DIAGNOSIS — Z3A24 24 weeks gestation of pregnancy: Secondary | ICD-10-CM

## 2019-01-11 DIAGNOSIS — Z8632 Personal history of gestational diabetes: Secondary | ICD-10-CM

## 2019-01-11 DIAGNOSIS — O09899 Supervision of other high risk pregnancies, unspecified trimester: Secondary | ICD-10-CM

## 2019-01-11 DIAGNOSIS — O09212 Supervision of pregnancy with history of pre-term labor, second trimester: Secondary | ICD-10-CM

## 2019-01-11 DIAGNOSIS — O09299 Supervision of pregnancy with other poor reproductive or obstetric history, unspecified trimester: Secondary | ICD-10-CM

## 2019-01-11 DIAGNOSIS — O09292 Supervision of pregnancy with other poor reproductive or obstetric history, second trimester: Secondary | ICD-10-CM

## 2019-01-11 DIAGNOSIS — O09219 Supervision of pregnancy with history of pre-term labor, unspecified trimester: Secondary | ICD-10-CM

## 2019-01-11 DIAGNOSIS — O09522 Supervision of elderly multigravida, second trimester: Secondary | ICD-10-CM

## 2019-01-11 DIAGNOSIS — O09529 Supervision of elderly multigravida, unspecified trimester: Secondary | ICD-10-CM

## 2019-01-11 LAB — POCT URINALYSIS DIP (DEVICE)
Bilirubin Urine: NEGATIVE
Glucose, UA: 500 mg/dL — AB
Leukocytes,Ua: NEGATIVE
Nitrite: NEGATIVE
Protein, ur: NEGATIVE mg/dL
Specific Gravity, Urine: 1.015 (ref 1.005–1.030)
Urobilinogen, UA: 0.2 mg/dL (ref 0.0–1.0)
pH: 7 (ref 5.0–8.0)

## 2019-01-11 MED ORDER — OMEPRAZOLE 20 MG PO CPDR: 20.0000 mg | DELAYED_RELEASE_CAPSULE | Freq: Every day | ORAL | 6 refills | Status: AC

## 2019-01-11 MED ORDER — METFORMIN HCL 500 MG PO TABS: ORAL_TABLET | ORAL | 3 refills | Status: DC

## 2019-01-11 NOTE — Progress Notes (Signed)
   TELEHEALTH VIRTUAL OBSTETRICS VISIT ENCOUNTER NOTE  I connected with [name] on 01/11/19 at 10:15 AM EDT by telephone at home and verified that I am speaking with the correct person using two identifiers.   I discussed the limitations, risks, security and privacy concerns of performing an evaluation and management service by telephone and the availability of in person appointments. I also discussed with the patient that there may be a patient responsible charge related to this service. The patient expressed understanding and agreed to proceed.  Subjective:  Michelle Cross is a 38 y.o. [id number] at [redacted]w[redacted]d being followed for ongoing prenatal care.  She is currently monitored for the following issues for this high-risk pregnancy and has Gestational diabetes; Supervision of high risk pregnancy, antepartum; History of preterm delivery, currently pregnant; History of gestational diabetes in prior pregnancy, currently pregnant; Language barrier; and Multigravida of advanced maternal age on their problem list.  Patient reports seasonal allergy symptoms and reflux symptoms.  Reports fetal movement. Denies any contractions, bleeding or leaking of fluid.   The following portions of the patient's history were reviewed and updated as appropriate: allergies, current medications, past family history, past medical history, past social history, past surgical history and problem list.   Objective:   General:  Alert, oriented and cooperative.   Mental Status: Normal mood and affect perceived. Normal judgment and thought content.  Rest of physical exam deferred due to type of encounter  Assessment and Plan:  Pregnancy: [id number] at [redacted]w[redacted]d 1. Supervision of high risk pregnancy, antepartum   2. Antepartum multigravida of advanced maternal age - declined  3. History of gestational diabetes in prior pregnancy, currently pregnant - I have encouraged her to take the baby asa but she has not picked it up yet.  - her fastings are all above 110s-120 and 2 hour PCs all  Elevated above 120 - I will prescibed 500 mg metformin qam and qhs, she is aware that these will need to be increased - she prefers that her u/s s be done at the health dept due to cost  4. History of preterm delivery, currently pregnant   5. Gestational diabetes mellitus (GDM), antepartum, gestational diabetes method of control unspecified  6. Language barrier- live Spanish interpretor used for the exam   7. Reflux- prilosec 8. Seasonal allergies- rec OTC meds  Preterm labor symptoms and general obstetric precautions including but not limited to vaginal bleeding, contractions, leaking of fluid and fetal movement were reviewed in detail with the patient.  I discussed the assessment and treatment plan with the patient. The patient was provided an opportunity to ask questions and all were answered. The patient agreed with the plan and demonstrated an understanding of the instructions. The patient was advised to call back or seek an in-person office evaluation/go to [facility] at [facility] for any urgent or concerning symptoms. Please refer to After Visit Summary for other counseling recommendations.   I provided 8 minutes of non-face-to-face time during this encounter.  No follow-ups on file.  Future Appointments  Date Time Provider Department Center  01/13/2019 10:45 AM [facility] NURSE [facility]  01/13/2019 10:45 AM WH-[facility] [place] 5 WH-MFCUS M[facility]-US    Allie Bossier, MD Center for [employer], [facility]

## 2019-01-13 ENCOUNTER — Ambulatory Visit (HOSPITAL_COMMUNITY)
Admission: RE | Admit: 2019-01-13 | Discharge: 2019-01-13 | Disposition: A | Discharge status: Home / Self Care | Payer: Self-pay | Source: Ambulatory Visit | Attending: Obstetrics and Gynecology | Admitting: Obstetrics and Gynecology

## 2019-01-13 ENCOUNTER — Ambulatory Visit (HOSPITAL_COMMUNITY): Payer: Self-pay | Admitting: *Deleted

## 2019-01-13 ENCOUNTER — Other Ambulatory Visit: Payer: Self-pay

## 2019-01-13 ENCOUNTER — Encounter (HOSPITAL_COMMUNITY): Payer: Self-pay

## 2019-01-13 VITALS — BP 100/65 | HR 84 | Temp 98.4°F

## 2019-01-13 DIAGNOSIS — O09212 Supervision of pregnancy with history of pre-term labor, second trimester: Secondary | ICD-10-CM

## 2019-01-13 DIAGNOSIS — O09292 Supervision of pregnancy with other poor reproductive or obstetric history, second trimester: Secondary | ICD-10-CM

## 2019-01-13 DIAGNOSIS — O09523 Supervision of elderly multigravida, third trimester: Secondary | ICD-10-CM | POA: Insufficient documentation

## 2019-01-13 DIAGNOSIS — Z362 Encounter for other antenatal screening follow-up: Secondary | ICD-10-CM

## 2019-01-13 DIAGNOSIS — Z3A24 24 weeks gestation of pregnancy: Secondary | ICD-10-CM

## 2019-01-13 DIAGNOSIS — O09522 Supervision of elderly multigravida, second trimester: Secondary | ICD-10-CM | POA: Insufficient documentation

## 2019-01-19 ENCOUNTER — Ambulatory Visit (INDEPENDENT_AMBULATORY_CARE_PROVIDER_SITE_OTHER): Payer: Self-pay | Admitting: Obstetrics & Gynecology

## 2019-01-19 ENCOUNTER — Other Ambulatory Visit: Payer: Self-pay

## 2019-01-19 ENCOUNTER — Telehealth: Payer: Self-pay

## 2019-01-19 DIAGNOSIS — Z789 Other specified health status: Secondary | ICD-10-CM

## 2019-01-19 DIAGNOSIS — O0990 Supervision of high risk pregnancy, unspecified, unspecified trimester: Secondary | ICD-10-CM

## 2019-01-19 DIAGNOSIS — O09899 Supervision of other high risk pregnancies, unspecified trimester: Secondary | ICD-10-CM

## 2019-01-19 DIAGNOSIS — O09522 Supervision of elderly multigravida, second trimester: Secondary | ICD-10-CM

## 2019-01-19 DIAGNOSIS — O24419 Gestational diabetes mellitus in pregnancy, unspecified control: Secondary | ICD-10-CM

## 2019-01-19 DIAGNOSIS — O09212 Supervision of pregnancy with history of pre-term labor, second trimester: Secondary | ICD-10-CM

## 2019-01-19 DIAGNOSIS — O09219 Supervision of pregnancy with history of pre-term labor, unspecified trimester: Secondary | ICD-10-CM

## 2019-01-19 NOTE — Telephone Encounter (Signed)
Called pt with Spanish Interpreter Eda R.  I informed pt that she will receive her BP cuff in the mail.  We are requesting that once she receives her cuff to please check her BP once a week and to be prepared to take her BP at her next visit with the provider.  I also informed her son who is 37 to help mom with BP and if there are any questions to please call the office.  I also informed him to not worry about the app that we were discussing earlier because his mom will check her BP once a week, report if BP is 140/90 or greater, and to make sure that she is prepared to check her BP with the provider for future visits.  Pt stated understanding.

## 2019-01-19 NOTE — Progress Notes (Signed)
   TELEHEALTH VIRTUAL OBSTETRICS VISIT ENCOUNTER NOTE  I connected with [name] on 01/19/19 at 11:15 AM EDT by telephone at home and verified that I am speaking with the correct person using two identifiers.   I discussed the limitations, risks, security and privacy concerns of performing an evaluation and management service by telephone and the availability of in person appointments. I also discussed with the patient that there may be a patient responsible charge related to this service. The patient expressed understanding and agreed to proceed.  Subjective:  Michelle Cross is a 38 y.o. [id number] at [redacted]w[redacted]d being followed for ongoing prenatal care.  She is currently monitored for the following issues for this high risk pregnancy and has Gestational diabetes; Supervision of high risk pregnancy, antepartum; History of preterm delivery, currently pregnant; History of gestational diabetes in prior pregnancy, currently pregnant; Language barrier; and Multigravida of advanced maternal age on their problem list.  Patient reports no problems. The prilosec helped her reflux. Reports fetal movement. Denies any contractions, bleeding or leaking of fluid.   The following portions of the patient's history were reviewed and updated as appropriate: allergies, current medications, past family history, past medical history, past social history, past surgical history and problem list.   Objective:   General:  Alert, oriented and cooperative.   Mental Status: Normal mood and affect perceived. Normal judgment and thought content.  Rest of physical exam deferred due to type of encounter  Assessment and Plan:  Pregnancy: [id number] at [redacted]w[redacted]d 1. Multigravida of advanced maternal age in second trimester   2. Language barrier  - live interpretor present for exam  3. Gestational diabetes mellitus (GDM) in second trimester, gestational diabetes method of control unspecified - she started on metformin qam and qhs  - reports that her fastings are 102, 111, 106. Rec take 1000mg  qhs - reports that her 2 hour PCs 103, 91, 89, 82, 127, 92  4. History of preterm delivery, currently pregnant - she had a PTD at 36 weeks her first child, full term with the others  5. Supervision of high risk pregnancy, antepartum - baby scripts ordered since she does not have access to a BP cuff - televisit in 2 weeks, 4 weeks in person  PTL labor symptoms and general obstetric precautions including but not limited to vaginal bleeding, contractions, leaking of fluid and fetal movement were reviewed in detail with the patient.  I discussed the assessment and treatment plan with the patient. The patient was provided an opportunity to ask questions and all were answered. The patient agreed with the plan and demonstrated an understanding of the instructions. The patient was advised to call back or seek an in-person office evaluation/go to MAU at [facility] for any urgent or concerning symptoms. Please refer to After Visit Summary for other counseling recommendations.   I provided 8 minutes of non-face-to-face time during this encounter.  No follow-ups on file.  Future Appointments  Date Time Provider Department Center  01/19/2019 11:15 AM Allie Bossier, MD [facility]    Allie Bossier, MD Center for [facility], [facility]

## 2019-01-31 ENCOUNTER — Ambulatory Visit (INDEPENDENT_AMBULATORY_CARE_PROVIDER_SITE_OTHER): Payer: Self-pay | Admitting: Obstetrics & Gynecology

## 2019-01-31 DIAGNOSIS — Z3A27 27 weeks gestation of pregnancy: Secondary | ICD-10-CM

## 2019-01-31 DIAGNOSIS — O09523 Supervision of elderly multigravida, third trimester: Secondary | ICD-10-CM

## 2019-01-31 DIAGNOSIS — O09212 Supervision of pregnancy with history of pre-term labor, second trimester: Secondary | ICD-10-CM

## 2019-01-31 DIAGNOSIS — O24419 Gestational diabetes mellitus in pregnancy, unspecified control: Secondary | ICD-10-CM

## 2019-01-31 DIAGNOSIS — O09899 Supervision of other high risk pregnancies, unspecified trimester: Secondary | ICD-10-CM

## 2019-01-31 DIAGNOSIS — Z789 Other specified health status: Secondary | ICD-10-CM

## 2019-01-31 DIAGNOSIS — O09219 Supervision of pregnancy with history of pre-term labor, unspecified trimester: Secondary | ICD-10-CM

## 2019-01-31 MED ORDER — INSULIN REGULAR HUMAN 100 UNIT/ML IJ SOLN: INTRAMUSCULAR | 11 refills | Status: AC

## 2019-01-31 MED ORDER — INSULIN STARTER KIT- SYRINGES (ENGLISH): 1.0000 | Freq: Once | Status: AC

## 2019-01-31 MED ORDER — INSULIN NPH (HUMAN) (ISOPHANE) 100 UNIT/ML ~~LOC~~ SUSP: SUBCUTANEOUS | 3 refills | Status: AC

## 2019-01-31 NOTE — Progress Notes (Signed)
   TELEHEALTH VIRTUAL OBSTETRICS PRENATAL VISIT ENCOUNTER NOTE  I connected with Michelle Cross on 01/31/19 at  1:35 PM EDT by WebEx at home and verified that I am speaking with the correct person using two identifiers.   I discussed the limitations, risks, security and privacy concerns of performing an evaluation and management service by telephone and the availability of in person appointments. I also discussed with the patient that there may be a patient responsible charge related to this service. The patient expressed understanding and agreed to proceed. Subjective:  Michelle Cross is a 38 y.o. [id number] at [redacted]w[redacted]d being seen today for ongoing prenatal care.  She is currently monitored for the following issues for this high-risk pregnancy and has Gestational diabetes; Supervision of high risk pregnancy, antepartum; History of preterm delivery, currently pregnant; History of gestational diabetes in prior pregnancy, currently pregnant; Language barrier; and Multigravida of advanced maternal age on their problem list.  Patient reports no complaints.  Reports fetal movement. Contractions: Not present. Vag. Bleeding: None.  Movement: Present. Denies any contractions, bleeding or leaking of fluid.   The following portions of the patient's history were reviewed and updated as appropriate: allergies, current medications, past family history, past medical history, past social history, past surgical history and problem list.   Objective:  There were no vitals filed for this visit.  Fetal Status:     Movement: Present     General:  Alert, oriented and cooperative. Patient is in no acute distress.  Respiratory: Normal respiratory effort, no problems with respiration noted  Mental Status: Normal mood and affect. Normal behavior. Normal judgment and thought content.  Rest of physical exam deferred due to type of encounter  Assessment and Plan:  Pregnancy: [id number] at [redacted]w[redacted]d 1. Gestational diabetes mellitus  (GDM) in third trimester, gestational diabetes method of control unspecified - [facility] u/s  2. Language barrier - live interpretor used for visit  3. Multigravida of advanced maternal age in third trimester   4. History of preterm delivery, currently pregnant - at 36 weeks with first baby, the rest were term  5. Gestational diabetes mellitus (GDM), antepartum, gestational diabetes method of control unspecified - taking metformin 500 mg a q AM and 1000 mg q hs, fastings still above 100. She took insulin with her first pregnancy.  - She will have an appt with Bev tomorrow morning to refresh herself on how to use insulin.  Per formula: 22 units NPH at breakfast and 10 units qhs 8 units of regular with breakfast and 8 units with supper (evening meal)  Preterm labor symptoms and general obstetric precautions including but not limited to vaginal bleeding, contractions, leaking of fluid and fetal movement were reviewed in detail with the patient. I discussed the assessment and treatment plan with the patient. The patient was provided an opportunity to ask questions and all were answered. The patient agreed with the plan and demonstrated an understanding of the instructions. The patient was advised to call back or seek an in-person office evaluation/go to MAU at [facility] for any urgent or concerning symptoms. Please refer to After Visit Summary for other counseling recommendations.   I provided 10 minutes of face-to-face via WebEx time during this encounter.  No follow-ups on file.  No future appointments.  Allie Bossier, MD Center for [employer], [facility]

## 2019-01-31 NOTE — Progress Notes (Signed)
Pt reports not receiving email for babyscripts. Pt informed that they will be contacted on her behalf. Pt verbalized understanding.

## 2019-02-01 ENCOUNTER — Telehealth: Payer: Self-pay | Admitting: Advanced Practice Midwife

## 2019-02-01 ENCOUNTER — Other Ambulatory Visit: Payer: Self-pay

## 2019-02-01 ENCOUNTER — Ambulatory Visit: Payer: Self-pay | Admitting: *Deleted

## 2019-02-01 ENCOUNTER — Encounter: Payer: Self-pay | Attending: Obstetrics & Gynecology | Admitting: *Deleted

## 2019-02-01 DIAGNOSIS — Z3A00 Weeks of gestation of pregnancy not specified: Secondary | ICD-10-CM | POA: Insufficient documentation

## 2019-02-01 DIAGNOSIS — O24419 Gestational diabetes mellitus in pregnancy, unspecified control: Secondary | ICD-10-CM | POA: Insufficient documentation

## 2019-02-01 DIAGNOSIS — O24414 Gestational diabetes mellitus in pregnancy, insulin controlled: Secondary | ICD-10-CM

## 2019-02-01 MED ORDER — "INSULIN SYRINGE 31G X 5/16"" 0.5 ML MISC": 1.0000 | Freq: Three times a day (TID) | 3 refills | Status: AC

## 2019-02-01 NOTE — Telephone Encounter (Signed)
The patient visited our office at 10am. She stated she was told to come in at 59. After consulting with Meriam Sprague I informed the patient her appointment will be moved to 11:15 with the option to be seen sooner as Bev is with a patient. The patient verbalized understanding.

## 2019-02-01 NOTE — Progress Notes (Signed)
Insulin Instruction  Patient speaks Spanish, live interpretor here for this visit.  Patient was seen on 02/01/2019 for insulin instruction.  MD orders are:  Pre breakfast: 22 NPH mixed with 8 units Regular Per supper: 8 units Regular Bedtime: 10 units NPH  She brought her insulin with her but needs Rx for syringes. That Rx has now been sent as well.   The following learning objectives were met by the patient during this visit:   Insulin Action of NPH and Regular insulins  Reviewed syringe & vial including # units per syringe and vial  Hygiene and storage  Drawing up single and mixed doses if using vials   Single dose   Mixed dose  Rotation of Sites  Hypoglycemia- symptoms, causes, treatment choices  Record keeping and MD follow up  Patient demonstrated understanding of insulin administration by return demonstration.  Patient received the following handouts:  Insulin Instruction Handout                                        Patient to start on insulin as Rx'd by MD  Patient will be seen for follow-up in 2 weeks by Dr. Hulan Fray and thru [employer] as needed.

## 2019-02-01 NOTE — Addendum Note (Signed)
Addended by: Kathee Delton on: 02/01/2019 03:08 PM   Modules accepted: Orders

## 2019-02-07 ENCOUNTER — Telehealth: Payer: Self-pay | Admitting: *Deleted

## 2019-02-07 NOTE — Telephone Encounter (Signed)
-----   Message from Reva Bores, MD sent at 02/04/2019 11:12 PM EDT ----- Has not logged any CBGs in BabyScripts? Language barrier, she is taking them. Can this be fixed or remove from platform?

## 2019-02-07 NOTE — Telephone Encounter (Signed)
Interpreter [phone] used to call pt regarding babyscripts, to determine if she was checking her blood sugars, having difficulty with the app, or just did not want to use the app. Pt did not pick up.  Left voicemail advising pt that she was being contacted regarding babyscripts and requesting she contact the office.

## 2019-02-08 NOTE — Telephone Encounter (Signed)
Interpreter [phone] used to call pt.  Pt did not pick up.  Left message advising pt that she was being contacted regarding babyscripts and requesting she call the clinic to discuss.  Pt has appointment 02/14/19.  Will discuss at that time.

## 2019-02-09 ENCOUNTER — Telehealth: Payer: Self-pay | Admitting: Family Medicine

## 2019-02-09 NOTE — Telephone Encounter (Signed)
Called patient with Michelle Cross, she left a detailed message about upcoming Appt and also the information about [employer].

## 2019-02-14 ENCOUNTER — Ambulatory Visit (INDEPENDENT_AMBULATORY_CARE_PROVIDER_SITE_OTHER): Payer: Self-pay | Admitting: Obstetrics & Gynecology

## 2019-02-14 ENCOUNTER — Ambulatory Visit: Payer: Self-pay

## 2019-02-14 ENCOUNTER — Other Ambulatory Visit: Payer: Self-pay

## 2019-02-14 VITALS — BP 100/69 | Wt 155.0 lb

## 2019-02-14 DIAGNOSIS — Z789 Other specified health status: Secondary | ICD-10-CM

## 2019-02-14 DIAGNOSIS — O24414 Gestational diabetes mellitus in pregnancy, insulin controlled: Secondary | ICD-10-CM

## 2019-02-14 DIAGNOSIS — O09523 Supervision of elderly multigravida, third trimester: Secondary | ICD-10-CM

## 2019-02-14 DIAGNOSIS — O09899 Supervision of other high risk pregnancies, unspecified trimester: Secondary | ICD-10-CM

## 2019-02-14 DIAGNOSIS — Z758 Other problems related to medical facilities and other health care: Secondary | ICD-10-CM

## 2019-02-14 DIAGNOSIS — Z3A29 29 weeks gestation of pregnancy: Secondary | ICD-10-CM

## 2019-02-14 DIAGNOSIS — O09213 Supervision of pregnancy with history of pre-term labor, third trimester: Secondary | ICD-10-CM

## 2019-02-14 DIAGNOSIS — O0990 Supervision of high risk pregnancy, unspecified, unspecified trimester: Secondary | ICD-10-CM

## 2019-02-14 NOTE — Progress Notes (Signed)
[place] interpreter used and pt was informed of ultrasound for 6/15 @11 .

## 2019-02-14 NOTE — Progress Notes (Signed)
   TELEHEALTH VIRTUAL OBSTETRICS PRENATAL VISIT ENCOUNTER NOTE  I connected with Michelle Cross on 02/14/19 at  9:15 AM EDT by WebEx at home and verified that I am speaking with the correct person using two identifiers.   I discussed the limitations, risks, security and privacy concerns of performing an evaluation and management service by telephone and the availability of in person appointments. I also discussed with the patient that there may be a patient responsible charge related to this service. The patient expressed understanding and agreed to proceed. Subjective:  Michelle Cross is a 38 y.o. [id number] at [redacted]w[redacted]d being seen today for ongoing prenatal care.  She is currently monitored for the following issues for this high-risk pregnancy and has Gestational diabetes; Supervision of high risk pregnancy, antepartum; History of preterm delivery, currently pregnant; History of gestational diabetes in prior pregnancy, currently pregnant; Language barrier; and Multigravida of advanced maternal age on their problem list.  Patient reports no complaints. She tried soap for the treatment of her restless legs and it worked!  Reports fetal movement. Contractions: Not present. Vag. Bleeding: None.  Movement: Present. Denies any contractions, bleeding or leaking of fluid.   The following portions of the patient's history were reviewed and updated as appropriate: allergies, current medications, past family history, past medical history, past social history, past surgical history and problem list.   Objective:  There were no vitals filed for this visit.  Fetal Status:     Movement: Present     General:  Alert, oriented and cooperative. Patient is in no acute distress.  Respiratory: Normal respiratory effort, no problems with respiration noted  Mental Status: Normal mood and affect. Normal behavior. Normal judgment and thought content.  Rest of physical exam deferred due to type of encounter  Assessment and  Plan:  Pregnancy: [id number] at [redacted]w[redacted]d 1. Multigravida of advanced maternal age in third trimester   2. Insulin controlled gestational diabetes mellitus (GDM) in third trimester - good sugar control on insulin  3. Supervision of high risk pregnancy, antepartum - [facility] u/s followup ordered for 4 weeks  4. History of preterm delivery, currently pregnant - she denies s/sx of PTL  5. Language barrier - live interpretor used for visit  Preterm labor symptoms and general obstetric precautions including but not limited to vaginal bleeding, contractions, leaking of fluid and fetal movement were reviewed in detail with the patient. I discussed the assessment and treatment plan with the patient. The patient was provided an opportunity to ask questions and all were answered. The patient agreed with the plan and demonstrated an understanding of the instructions. The patient was advised to call back or seek an in-person office evaluation/go to MAU at [facility] for any urgent or concerning symptoms. Please refer to After Visit Summary for other counseling recommendations.   I provided 10 minutes of face-to-face via WebEx time during this encounter.  No follow-ups on file.  No future appointments.  Allie Bossier, MD Center for [employer], [facility]

## 2019-02-23 ENCOUNTER — Encounter: Payer: Self-pay | Admitting: *Deleted

## 2019-02-28 ENCOUNTER — Other Ambulatory Visit: Payer: Self-pay

## 2019-02-28 ENCOUNTER — Encounter: Payer: Self-pay | Admitting: Obstetrics and Gynecology

## 2019-02-28 ENCOUNTER — Ambulatory Visit (INDEPENDENT_AMBULATORY_CARE_PROVIDER_SITE_OTHER): Payer: Self-pay | Admitting: Obstetrics and Gynecology

## 2019-02-28 DIAGNOSIS — O0990 Supervision of high risk pregnancy, unspecified, unspecified trimester: Secondary | ICD-10-CM

## 2019-02-28 DIAGNOSIS — Z789 Other specified health status: Secondary | ICD-10-CM

## 2019-02-28 DIAGNOSIS — O09523 Supervision of elderly multigravida, third trimester: Secondary | ICD-10-CM

## 2019-02-28 DIAGNOSIS — O24419 Gestational diabetes mellitus in pregnancy, unspecified control: Secondary | ICD-10-CM

## 2019-02-28 DIAGNOSIS — O0993 Supervision of high risk pregnancy, unspecified, third trimester: Secondary | ICD-10-CM

## 2019-02-28 DIAGNOSIS — Z3A31 31 weeks gestation of pregnancy: Secondary | ICD-10-CM

## 2019-02-28 NOTE — Progress Notes (Signed)
    TELEHEALTH VIRTUAL OBSTETRICS VISIT ENCOUNTER NOTE  I connected with [name] on 02/28/19 at  1:35 PM EDT by telephone at home and verified that I am speaking with the correct person using two identifiers.   I discussed the limitations, risks, security and privacy concerns of performing an evaluation and management service by telephone and the availability of in person appointments. I also discussed with the patient that there may be a patient responsible charge related to this service. The patient expressed understanding and agreed to proceed.  Subjective:  Michelle Cross is a 38 y.o. [id number] at [redacted]w[redacted]d being followed for ongoing prenatal care.  She is currently monitored for the following issues for this high-risk pregnancy and has GDM, class A2; Supervision of high risk pregnancy, antepartum; History of preterm delivery, currently pregnant; History of gestational diabetes in prior pregnancy, currently pregnant; Language barrier; and Multigravida of advanced maternal age on their problem list.  Patient reports no complaints. Reports fetal movement. Denies any contractions, bleeding or leaking of fluid.   The following portions of the patient's history were reviewed and updated as appropriate: allergies, current medications, past family history, past medical history, past social history, past surgical history and problem list.   Objective:  There were no vitals filed for this visit.  Babyscripts Data Reviewed: yes  General:  Alert, oriented and cooperative.   Mental Status: Normal mood and affect perceived. Normal judgment and thought content.  Rest of physical exam deferred due to type of encounter  Assessment and Plan:  Pregnancy: [id number] at [redacted]w[redacted]d 1. Supervision of high risk pregnancy, antepartum Routine care  2. GDM, class A2 Continue current regimen. Not on metformin. Regular 8/8 (fasting and dinner) NPH 28/10 (fasting and qhs) Fasting: Low 90s 2 hour PP: 80s-100s   [facility] u/s on 6/15  Start weekly testing at 36wks  3. Language barrier Interpreter used  4. Multigravida of advanced maternal age in third trimester No issus  Preterm labor symptoms and general obstetric precautions including but not limited to vaginal bleeding, contractions, leaking of fluid and fetal movement were reviewed in detail with the patient.  I discussed the assessment and treatment plan with the patient. The patient was provided an opportunity to ask questions and all were answered. The patient agreed with the plan and demonstrated an understanding of the instructions. The patient was advised to call back or seek an in-person office evaluation/go to MAU at [facility] for any urgent or concerning symptoms. Please refer to After Visit Summary for other counseling recommendations.   I provided 15 minutes of non-face-to-face time during this encounter. The visit was conducted via Webex-medicine  Return in about 2 weeks (around 03/14/2019) for hrob virtual webex.  No future appointments.  Camp Sherman Bing, MD Center for [employer], [facility]

## 2019-02-28 NOTE — Progress Notes (Signed)
I connected with  [name] on 02/28/19 at  1:35 PM EDT by telephone and verified that I am speaking with the correct person using two identifiers.   I discussed the limitations, risks, security and privacy concerns of performing an evaluation and management service by telephone and virtually and the availability of in person appointments. I also discussed with the patient that there may be a patient responsible charge related to this service. The patient expressed understanding and agreed to proceed.  Linda,RN 02/28/2019  1:33 PM

## 2019-03-10 ENCOUNTER — Telehealth: Payer: Self-pay | Admitting: Family Medicine

## 2019-03-10 NOTE — Telephone Encounter (Signed)
Patient contacted by [facility] about appt.

## 2019-03-14 ENCOUNTER — Ambulatory Visit (INDEPENDENT_AMBULATORY_CARE_PROVIDER_SITE_OTHER): Payer: Self-pay | Admitting: Obstetrics and Gynecology

## 2019-03-14 ENCOUNTER — Other Ambulatory Visit: Payer: Self-pay

## 2019-03-14 ENCOUNTER — Encounter: Payer: Self-pay | Admitting: Obstetrics and Gynecology

## 2019-03-14 VITALS — BP 102/66 | HR 97

## 2019-03-14 DIAGNOSIS — O09523 Supervision of elderly multigravida, third trimester: Secondary | ICD-10-CM

## 2019-03-14 DIAGNOSIS — O24419 Gestational diabetes mellitus in pregnancy, unspecified control: Secondary | ICD-10-CM

## 2019-03-14 DIAGNOSIS — Z789 Other specified health status: Secondary | ICD-10-CM

## 2019-03-14 DIAGNOSIS — O09899 Supervision of other high risk pregnancies, unspecified trimester: Secondary | ICD-10-CM

## 2019-03-14 DIAGNOSIS — O0990 Supervision of high risk pregnancy, unspecified, unspecified trimester: Secondary | ICD-10-CM

## 2019-03-14 DIAGNOSIS — O09213 Supervision of pregnancy with history of pre-term labor, third trimester: Secondary | ICD-10-CM

## 2019-03-14 NOTE — Progress Notes (Signed)
I connected with  Michelle Cross on 03/14/19 at  3:15 PM EDT by telephone and verified that I am speaking with the correct person using two identifiers.   I discussed the limitations, risks, security and privacy concerns of performing an evaluation and management service by telephone and the availability of in person appointments. I also discussed with the patient that there may be a patient responsible charge related to this service. The patient expressed understanding and agreed to proceed.  [name], [name] 03/14/2019  3:02 PM

## 2019-03-14 NOTE — Progress Notes (Signed)
   [place] VIRTUAL VIDEO VISIT ENCOUNTER NOTE  Provider location: Center for [employer] at [facility]   I connected with Kelle Darting on 03/14/19 at  3:15 PM EDT by WebEx Encounter at home and verified that I am speaking with the correct person using two identifiers.   I discussed the limitations, risks, security and privacy concerns of performing an evaluation and management service by telephone and the availability of in person appointments. I also discussed with the patient that there may be a patient responsible charge related to this service. The patient expressed understanding and agreed to proceed. Subjective:  Michelle Cross is a 38 y.o. [id number] at [redacted]w[redacted]d being seen today for ongoing prenatal care.  She is currently monitored for the following issues for this high-risk pregnancy and has GDM, class A2; Supervision of high risk pregnancy, antepartum; History of preterm delivery, currently pregnant; History of gestational diabetes in prior pregnancy, currently pregnant; Language barrier; and Multigravida of advanced maternal age on their problem list.  Patient reports occasional contractions.  Contractions: Irritability. Vag. Bleeding: None.  Movement: Present. Denies any leaking of fluid.   The following portions of the patient's history were reviewed and updated as appropriate: allergies, current medications, past family history, past medical history, past social history, past surgical history and problem list.   Objective:   Vitals:   03/14/19 1503  BP: 102/66  Pulse: 97    Fetal Status:     Movement: Present     General:  Alert, oriented and cooperative. Patient is in no acute distress.  Respiratory: Normal respiratory effort, no problems with respiration noted  Mental Status: Normal mood and affect. Normal behavior. Normal judgment and thought content.  Rest of physical exam deferred due to type of encounter  Imaging: No results found.   Assessment and Plan:  Pregnancy: Z6W1093 at [redacted]w[redacted]d 1. Supervision of high risk pregnancy, antepartum Stable  2. GDM, class A2 U/S at [facility] today CBG's slightly elevated across the board Will increase insulin regiment to as follows Reg 10/10 units in AM and dinner NPH 30/12 units in AM and qhs Will start weekly antenatal testing  3. Multigravida of advanced maternal age in third trimester   4. History of preterm delivery, currently pregnant Stable  5. Language barrier Live interrupter used during today's visit  Preterm labor symptoms and general obstetric precautions including but not limited to vaginal bleeding, contractions, leaking of fluid and fetal movement were reviewed in detail with the patient. I discussed the assessment and treatment plan with the patient. The patient was provided an opportunity to ask questions and all were answered. The patient agreed with the plan and demonstrated an understanding of the instructions. The patient was advised to call back or seek an in-person office evaluation/go to MAU at [facility] for any urgent or concerning symptoms. Please refer to After Visit Summary for other counseling recommendations.   I provided 10 minutes of face-to-face time during this encounter.  Return in about 1 week (around 03/21/2019) for OB visit, face to face, pt also needs to be scheduled for weekly BPP with [facility] in our office .  No future appointments.  Chancy Milroy, MD Center for [facility], [place]

## 2019-03-23 ENCOUNTER — Encounter: Payer: Self-pay | Admitting: Obstetrics and Gynecology

## 2019-03-23 ENCOUNTER — Encounter: Payer: Self-pay | Admitting: *Deleted

## 2019-03-23 ENCOUNTER — Ambulatory Visit (INDEPENDENT_AMBULATORY_CARE_PROVIDER_SITE_OTHER): Payer: Self-pay | Admitting: Obstetrics and Gynecology

## 2019-03-23 ENCOUNTER — Other Ambulatory Visit: Payer: Self-pay

## 2019-03-23 ENCOUNTER — Ambulatory Visit (INDEPENDENT_AMBULATORY_CARE_PROVIDER_SITE_OTHER): Payer: Self-pay | Admitting: General Practice

## 2019-03-23 ENCOUNTER — Ambulatory Visit: Payer: Self-pay

## 2019-03-23 VITALS — BP 110/67 | HR 92 | Wt 167.0 lb

## 2019-03-23 DIAGNOSIS — O24419 Gestational diabetes mellitus in pregnancy, unspecified control: Secondary | ICD-10-CM

## 2019-03-23 DIAGNOSIS — O0993 Supervision of high risk pregnancy, unspecified, third trimester: Secondary | ICD-10-CM

## 2019-03-23 DIAGNOSIS — O09213 Supervision of pregnancy with history of pre-term labor, third trimester: Secondary | ICD-10-CM

## 2019-03-23 DIAGNOSIS — Z3A34 34 weeks gestation of pregnancy: Secondary | ICD-10-CM

## 2019-03-23 DIAGNOSIS — O0990 Supervision of high risk pregnancy, unspecified, unspecified trimester: Secondary | ICD-10-CM

## 2019-03-23 DIAGNOSIS — O09899 Supervision of other high risk pregnancies, unspecified trimester: Secondary | ICD-10-CM

## 2019-03-23 DIAGNOSIS — Z789 Other specified health status: Secondary | ICD-10-CM

## 2019-03-23 MED ORDER — FLUCONAZOLE 150 MG PO TABS: ORAL_TABLET | ORAL | 0 refills | Status: DC

## 2019-03-23 NOTE — Progress Notes (Signed)
   PRENATAL VISIT NOTE  Subjective:  Michelle Cross is a 38 y.o. [id number] at [redacted]w[redacted]d being seen today for ongoing prenatal care.  She is currently monitored for the following issues for this high-risk pregnancy and has GDM, class A2; Supervision of high risk pregnancy, antepartum; History of preterm delivery, currently pregnant; History of gestational diabetes in prior pregnancy, currently pregnant; Language barrier; and Multigravida of advanced maternal age on their problem list.  Patient reports vaginal pruritis.  Contractions: Irritability. Vag. Bleeding: None.  Movement: Present. Denies leaking of fluid.   The following portions of the patient's history were reviewed and updated as appropriate: allergies, current medications, past family history, past medical history, past social history, past surgical history and problem list.   Objective:   Vitals:   03/23/19 1451  BP: 110/67  Pulse: 92  Weight: 167 lb (75.8 kg)    Fetal Status: Fetal Heart Rate (bpm): NST(Simultaneous filing. User may not have seen previous data.)   Movement: Present     General:  Alert, oriented and cooperative. Patient is in no acute distress.  Skin: Skin is warm and dry. No rash noted.   Cardiovascular: Normal heart rate noted  Respiratory: Normal respiratory effort, no problems with respiration noted  Abdomen: Soft, gravid, appropriate for gestational age.  Pain/Pressure: Present     Pelvic: Cervical exam deferred        Extremities: Normal range of motion.  Edema: None  Mental Status: Normal mood and affect. Normal behavior. Normal judgment and thought content.   Assessment and Plan:  Pregnancy: [id number] at [redacted]w[redacted]d 1. Supervision of high risk pregnancy, antepartum Patient is doing well without complaints  2. GDM, class A2 CBGs reviewed and significantly improved since insulin increase on 03/14/19 Encouraged patient to continue diet, insulin at current dosage and to increase level of physical activity 6/15  growth ultrasound EFW 2600 gm 65%tile BPP 10/10  3. Language barrier Spanish interpreter present during encounter  Preterm labor symptoms and general obstetric precautions including but not limited to vaginal bleeding, contractions, leaking of fluid and fetal movement were reviewed in detail with the patient. Please refer to After Visit Summary for other counseling recommendations.   Return in about 1 week (around 03/30/2019) for ROB, NST, BPP.  Future Appointments  Date Time Provider [place]  03/31/2019  2:15 PM [facility] NST [facility]  03/31/2019  3:15 PM Chancy Milroy, MD [facility] [place]  04/07/2019  9:15 AM WOC[facility]WOCA NST [facility] WOC  04/07/2019 10:15 AM Donnamae Jude, MD [facility] WOC  04/14/2019  9:15 AM WOC[facility]OCA NST [facility] WOC  04/14/2019 10:15 AM Woodroe Mode, MD [place]  04/21/2019  9:15 AM WOC-WOCA NST [facility] WOC  04/21/2019 10:15 AM Emily Filbert, MD [facility]OC  04/28/2019  9:15 AM WOC[facility]WOCA NST [facility] WOC  04/28/2019 10:15 AM Emily Filbert, MD [facility] WOC    Mora Bellman, MD

## 2019-03-23 NOTE — Progress Notes (Signed)
Pt informed that the ultrasound is considered a limited OB ultrasound and is not intended to be a complete ultrasound exam.  Patient also informed that the ultrasound is not being completed with the intent of assessing for fetal or placental anomalies or any pelvic abnormalities.  Explained that the purpose of today's ultrasound is to assess for  BPP, presentation and AFI.  Patient acknowledges the purpose of the exam and the limitations of the study.    Zyon Grout H RN BSN 03/23/19  

## 2019-03-31 ENCOUNTER — Ambulatory Visit (INDEPENDENT_AMBULATORY_CARE_PROVIDER_SITE_OTHER): Payer: Self-pay | Admitting: Obstetrics and Gynecology

## 2019-03-31 ENCOUNTER — Other Ambulatory Visit: Payer: Self-pay

## 2019-03-31 ENCOUNTER — Ambulatory Visit (INDEPENDENT_AMBULATORY_CARE_PROVIDER_SITE_OTHER): Payer: Medicaid Other | Admitting: *Deleted

## 2019-03-31 ENCOUNTER — Ambulatory Visit: Payer: Self-pay

## 2019-03-31 ENCOUNTER — Encounter: Payer: Self-pay | Admitting: Lactation Services

## 2019-03-31 ENCOUNTER — Encounter: Payer: Self-pay | Admitting: Obstetrics and Gynecology

## 2019-03-31 VITALS — BP 112/78 | HR 91 | Wt 165.7 lb

## 2019-03-31 DIAGNOSIS — Z3A35 35 weeks gestation of pregnancy: Secondary | ICD-10-CM

## 2019-03-31 DIAGNOSIS — O0993 Supervision of high risk pregnancy, unspecified, third trimester: Secondary | ICD-10-CM

## 2019-03-31 DIAGNOSIS — Z789 Other specified health status: Secondary | ICD-10-CM

## 2019-03-31 DIAGNOSIS — O09899 Supervision of other high risk pregnancies, unspecified trimester: Secondary | ICD-10-CM

## 2019-03-31 DIAGNOSIS — O24414 Gestational diabetes mellitus in pregnancy, insulin controlled: Secondary | ICD-10-CM | POA: Diagnosis not present

## 2019-03-31 DIAGNOSIS — O0990 Supervision of high risk pregnancy, unspecified, unspecified trimester: Secondary | ICD-10-CM

## 2019-03-31 DIAGNOSIS — O09213 Supervision of pregnancy with history of pre-term labor, third trimester: Secondary | ICD-10-CM

## 2019-03-31 DIAGNOSIS — O09523 Supervision of elderly multigravida, third trimester: Secondary | ICD-10-CM

## 2019-03-31 DIAGNOSIS — O24419 Gestational diabetes mellitus in pregnancy, unspecified control: Secondary | ICD-10-CM

## 2019-03-31 NOTE — Progress Notes (Signed)
Subjective:  Michelle Cross is a 38 y.o. [id number] at [redacted]w[redacted]d being seen today for ongoing prenatal care.  She is currently monitored for the following issues for this high-risk pregnancy and has GDM, class A2; Supervision of high risk pregnancy, antepartum; History of preterm delivery, currently pregnant; History of gestational diabetes in prior pregnancy, currently pregnant; Language barrier; and Multigravida of advanced maternal age on their problem list.  Patient reports no complaints.  Contractions: Irregular. Vag. Bleeding: None.  Movement: Present. Denies leaking of fluid.   The following portions of the patient's history were reviewed and updated as appropriate: allergies, current medications, past family history, past medical history, past social history, past surgical history and problem list. Problem list updated.  Objective:   Vitals:   03/31/19 1444  BP: 112/78  Pulse: 91  Weight: 165 lb 11.2 oz (75.2 kg)    Fetal Status: Fetal Heart Rate (bpm): NST   Movement: Present     General:  Alert, oriented and cooperative. Patient is in no acute distress.  Skin: Skin is warm and dry. No rash noted.   Cardiovascular: Normal heart rate noted  Respiratory: Normal respiratory effort, no problems with respiration noted  Abdomen: Soft, gravid, appropriate for gestational age. Pain/Pressure: Present     Pelvic:  Cervical exam deferred        Extremities: Normal range of motion.     Mental Status: Normal mood and affect. Normal behavior. Normal judgment and thought content.   Urinalysis:      Assessment and Plan:  Pregnancy: [id number] at [redacted]w[redacted]d  1. Supervision of high risk pregnancy, antepartum Stable GBS and vaginal cultures next visit  2. History of preterm delivery, currently pregnant   3. Insulin controlled gestational diabetes mellitus (GDM) in third trimester CBG's in goal range with current insulin regiment Will continue BPP 10/10 today Continue with weekly antenatal  testing Growth scan on 03/14/19  65 % Schedule IOL at next visit for 39 weeks 4. Language barrier Live interrupter used  5. Multigravida of advanced maternal age in third trimester     Preterm labor symptoms and general obstetric precautions including but not limited to vaginal bleeding, contractions, leaking of fluid and fetal movement were reviewed in detail with the patient. Please refer to After Visit Summary for other counseling recommendations.  Return in about 1 week (around 04/07/2019) for OB visit, face to face for GBS (already scheduled).   Michelle Milroy, MD

## 2019-03-31 NOTE — Progress Notes (Signed)
Interpreter [employer] present for encounter. Pt expressed concern for some skin tags which have developed on her neck, shoulders and between breasts.   Pt informed that the ultrasound is considered a limited OB ultrasound and is not intended to be a complete ultrasound exam.  Patient also informed that the ultrasound is not being completed with the intent of assessing for fetal or placental anomalies or any pelvic abnormalities.  Explained that the purpose of today's ultrasound is to assess for presentation, BPP and amniotic fluid volume.  Patient acknowledges the purpose of the exam and the limitations of the study.

## 2019-03-31 NOTE — Progress Notes (Signed)
Met with pt at request of Diane Day, RN for changing of Glucometer. Spoke with pt with assistance of Antoine.   Pt has been checking her Blood Glucose levels at home. She is out of supplies for the True Trac meter that she has.   Pt was given Prodigy meter with instructions for use and given test strips with it. Pt reports she has Lancets at home. Meter switched to Spanish. Pt voiced understanding to use of meter and lancet device.

## 2019-04-06 ENCOUNTER — Telehealth: Payer: Self-pay | Admitting: Obstetrics & Gynecology

## 2019-04-06 NOTE — Telephone Encounter (Signed)
Called the patient to inform of upcoming appointment. Left a detailed voicemail of the location, appointment time and date. Also informed the patient if they’ve been in close contact with someone or diagnosed with Covid19 in the previous 14-day period please call to reschedule. If the patient has also experience flu-like symptoms such as sore throat, fever, and/or shortness of breath. Please call our office to reschedule. In addition, please be sure to wear a face mask to the visit and sanitize hands upon entering the office. Also, no children or visitors or allowed due to Covid19 restrictions. If you have any questions or concerns, please contact our office. °

## 2019-04-07 ENCOUNTER — Ambulatory Visit (INDEPENDENT_AMBULATORY_CARE_PROVIDER_SITE_OTHER): Payer: Medicaid Other | Admitting: Family Medicine

## 2019-04-07 ENCOUNTER — Ambulatory Visit: Payer: Self-pay

## 2019-04-07 ENCOUNTER — Other Ambulatory Visit: Payer: Self-pay

## 2019-04-07 ENCOUNTER — Ambulatory Visit (INDEPENDENT_AMBULATORY_CARE_PROVIDER_SITE_OTHER): Payer: Self-pay | Admitting: *Deleted

## 2019-04-07 VITALS — BP 108/66 | HR 94 | Wt 167.0 lb

## 2019-04-07 DIAGNOSIS — Z789 Other specified health status: Secondary | ICD-10-CM

## 2019-04-07 DIAGNOSIS — O09899 Supervision of other high risk pregnancies, unspecified trimester: Secondary | ICD-10-CM

## 2019-04-07 DIAGNOSIS — Z603 Acculturation difficulty: Secondary | ICD-10-CM

## 2019-04-07 DIAGNOSIS — O24414 Gestational diabetes mellitus in pregnancy, insulin controlled: Secondary | ICD-10-CM

## 2019-04-07 DIAGNOSIS — Z113 Encounter for screening for infections with a predominantly sexual mode of transmission: Secondary | ICD-10-CM | POA: Diagnosis not present

## 2019-04-07 DIAGNOSIS — O24419 Gestational diabetes mellitus in pregnancy, unspecified control: Secondary | ICD-10-CM

## 2019-04-07 DIAGNOSIS — Z3A36 36 weeks gestation of pregnancy: Secondary | ICD-10-CM

## 2019-04-07 DIAGNOSIS — O09523 Supervision of elderly multigravida, third trimester: Secondary | ICD-10-CM

## 2019-04-07 DIAGNOSIS — O0990 Supervision of high risk pregnancy, unspecified, unspecified trimester: Secondary | ICD-10-CM

## 2019-04-07 DIAGNOSIS — O09213 Supervision of pregnancy with history of pre-term labor, third trimester: Secondary | ICD-10-CM

## 2019-04-07 DIAGNOSIS — O0993 Supervision of high risk pregnancy, unspecified, third trimester: Secondary | ICD-10-CM

## 2019-04-07 NOTE — Progress Notes (Signed)
Interpreter Mariel Gallego present for encounter.  Pt informed that the ultrasound is considered a limited OB ultrasound and is not intended to be a complete ultrasound exam.  Patient also informed that the ultrasound is not being completed with the intent of assessing for fetal or placental anomalies or any pelvic abnormalities.  Explained that the purpose of today's ultrasound is to assess for presentation, BPP and amniotic fluid volume.  Patient acknowledges the purpose of the exam and the limitations of the study.    

## 2019-04-07 NOTE — Patient Instructions (Signed)
° °Lactancia materna °Breastfeeding ° °Decidir amamantar es una de las mejores elecciones que puede hacer por usted y su bebé. Un cambio en las hormonas durante el embarazo hace que las mamas produzcan leche materna en las glándulas productoras de leche. Las hormonas impiden que la leche materna sea liberada antes del nacimiento del bebé. Además, impulsan el flujo de leche luego del nacimiento. Una vez que ha comenzado a amamantar, pensar en el bebé, así como la succión o el llanto, pueden estimular la liberación de leche de las glándulas productoras de leche. °Los beneficios de amamantar °Las investigaciones demuestran que la lactancia materna ofrece muchos beneficios de salud para bebés y madres. Además, ofrece una forma gratuita y conveniente de alimentar al bebé. °Para el bebé °· La primera leche (calostro) ayuda a mejorar el funcionamiento del aparato digestivo del bebé. °· Las células especiales de la leche (anticuerpos) ayudan a combatir las infecciones en el bebé. °· Los bebés que se alimentan con leche materna también tienen menos probabilidades de tener asma, alergias, obesidad o diabetes de tipo 2. Además, tienen menor riesgo de sufrir el síndrome de muerte súbita del lactante (SMSL). °· Los nutrientes de la leche materna son mejores para satisfacer las necesidades del bebé en comparación con la leche maternizada. °· La leche materna mejora el desarrollo cerebral del bebé. °Para usted °· La lactancia materna favorece el desarrollo de un vínculo muy especial entre la madre y el bebé. °· Es conveniente. La leche materna es económica y siempre está disponible a la temperatura correcta. °· La lactancia materna ayuda a quemar calorías. Le ayuda a perder el peso ganado durante el embarazo. °· Hace que el útero vuelva al tamaño que tenía antes del embarazo más rápido. Además, disminuye el sangrado (loquios) después del parto. °· La lactancia materna contribuye a reducir el riesgo de tener diabetes de tipo 2,  osteoporosis, artritis reumatoide, enfermedades cardiovasculares y cáncer de mama, ovario, útero y endometrio en el futuro. °Información básica sobre la lactancia °Comienzo de la lactancia °· Encuentre un lugar cómodo para sentarse o acostarse, con un buen respaldo para el cuello y la espalda. °· Coloque una almohada o una manta enrollada debajo del bebé para acomodarlo a la altura de la mama (si está sentada). Las almohadas para amamantar se han diseñado especialmente a fin de servir de apoyo para los brazos y el bebé mientras amamanta. °· Asegúrese de que la barriga del bebé (abdomen) esté frente a la suya. °· Masajee suavemente la mama. Con las yemas de los dedos, masajee los bordes exteriores de la mama hacia adentro, en dirección al pezón. Esto estimula el flujo de leche. Si la leche fluye lentamente, es posible que deba continuar con este movimiento durante la lactancia. °· Sostenga la mama con 4 dedos por debajo y el pulgar por arriba del pezón (forme la letra “C” con la mano). Asegúrese de que los dedos se encuentren lejos del pezón y de la boca del bebé. °· Empuje suavemente los labios del bebé con el pezón o con el dedo. °· Cuando la boca del bebé se abra lo suficiente, acérquelo rápidamente a la mama e introduzca todo el pezón y la aréola, tanto como sea posible, dentro de la boca del bebé. La aréola es la zona de color que rodea al pezón. °? Debe haber más aréola visible por arriba del labio superior del bebé que por debajo del labio inferior. °? Los labios del bebé deben estar abiertos y extendidos hacia afuera (evertidos) para asegurar   que el bebé se prenda de forma adecuada y cómoda. °? La lengua del bebé debe estar entre la encía inferior y la mama. °· Asegúrese de que la boca del bebé esté en la posición correcta alrededor del pezón (prendido). Los labios del bebé deben crear un sello sobre la mama y estar doblados hacia afuera (invertidos). °· Es común que el bebé succione durante 2 a 3 minutos  para que comience el flujo de leche materna. °Cómo debe prenderse °Es muy importante que le enseñe al bebé cómo prenderse adecuadamente a la mama. Si el bebé no se prende adecuadamente, puede causar dolor en los pezones, reducir la producción de leche materna y hacer que el bebé tenga un escaso aumento de peso. Además, si el bebé no se prende adecuadamente al pezón, puede tragar aire durante la alimentación. Esto puede causarle molestias al bebé. Hacer eructar al bebé al cambiar de mama puede ayudarlo a liberar el aire. Sin embargo, enseñarle al bebé cómo prenderse a la mama adecuadamente es la mejor manera de evitar que se sienta molesto por tragar aire mientras se alimenta. °Signos de que el bebé se ha prendido adecuadamente al pezón °· Tironea o succiona de modo silencioso, sin causarle dolor. Los labios del bebé deben estar extendidos hacia afuera (evertidos). °· Se escucha que traga cada 3 o 4 succiones una vez que la leche ha comenzado a fluir (después de que se produzca el reflejo de eyección de la leche). °· Hay movimientos musculares por arriba y por delante de sus oídos al succionar. °Signos de que el bebé no se ha prendido adecuadamente al pezón °· Hace ruidos de succión o de chasquido mientras se alimenta. °· Siente dolor en los pezones. °Si cree que el bebé no se prendió correctamente, deslice el dedo en la comisura de la boca y colóquelo entre las encías del bebé para interrumpir la succión. Intente volver a comenzar a amamantar. °Signos de lactancia materna exitosa °Signos del bebé °· El bebé disminuirá gradualmente el número de succiones o dejará de succionar por completo. °· El bebé se quedará dormido. °· El cuerpo del bebé se relajará. °· El bebé retendrá una pequeña cantidad de leche en la boca. °· El bebé se desprenderá solo del pecho. °Signos que presenta usted °· Las mamas han aumentado la firmeza, el peso y el tamaño 1 a 3 horas después de amamantar. °· Están más blandas inmediatamente después  de amamantar. °· Se producen un aumento del volumen de leche y un cambio en su consistencia y color hacia el quinto día de lactancia. °· Los pezones no duelen, no están agrietados ni sangran. °Signos de que su bebé recibe la cantidad de leche suficiente °· Mojar por lo menos 1 o 2 pañales durante las primeras 24 horas después del nacimiento. °· Mojar por lo menos 5 o 6 pañales cada 24 horas durante la primera semana después del nacimiento. La orina debe ser clara o de color amarillo pálido a los 5 días de vida. °· Mojar entre 6 y 8 pañales cada 24 horas a medida que el bebé sigue creciendo y desarrollándose. °· Defeca por lo menos 3 veces en 24 horas a los 5 días de vida. Las heces deben ser blandas y amarillentas. °· Defeca por lo menos 3 veces en 24 horas a los 7 días de vida. Las heces deben ser grumosas y amarillentas. °· No registra una pérdida de peso mayor al 10 % del peso al nacer durante los primeros 3 días de vida. °· Aumenta de peso un promedio de 4   a 7 onzas (113 a 198 g) por semana después de los 4 días de vida. °· Aumenta de peso, diariamente, de manera uniforme a partir de los 5 días de vida, sin registrar pérdida de peso después de las 2 semanas de vida. °Después de alimentarse, es posible que el bebé regurgite una pequeña cantidad de leche. Esto es normal. °Frecuencia y duración de la lactancia °El amamantamiento frecuente la ayudará a producir más leche y puede prevenir dolores en los pezones y las mamas extremadamente llenas (congestión mamaria). Alimente al bebé cuando muestre signos de hambre o si siente la necesidad de reducir la congestión de las mamas. Esto se denomina "lactancia a demanda". Las señales de que el bebé tiene hambre incluyen las siguientes: °· Aumento del estado de alerta, actividad o inquietud. °· Mueve la cabeza de un lado a otro. °· Abre la boca cuando se le toca la mejilla o la comisura de la boca (reflejo de búsqueda). °· Aumenta las vocalizaciones, tales como sonidos de  succión, se relame los labios, emite arrullos, suspiros o chirridos. °· Mueve la mano hacia la boca y se chupa los dedos o las manos. °· Está molesto o llora. °Evite el uso del chupete en las primeras 4 a 6 semanas después del nacimiento del bebé. Después de este período, podrá usar un chupete. Las investigaciones demostraron que el uso del chupete durante el primer año de vida del bebé disminuye el riesgo de tener el síndrome de muerte súbita del lactante (SMSL). °Permita que el niño se alimente en cada mama todo lo que desee. Cuando el bebé se desprende o se queda dormido mientras se está alimentando de la primera mama, ofrézcale la segunda. Debido a que, con frecuencia, los recién nacidos están somnolientos las primeras semanas de vida, es posible que deba despertar al bebé para alimentarlo. °Los horarios de lactancia varían de un bebé a otro. Sin embargo, las siguientes reglas pueden servir como guía para ayudarla a garantizar que el bebé se alimenta adecuadamente: °· Se puede amamantar a los recién nacidos (bebés de 4 semanas o menos de vida) cada 1 a 3 horas. °· No deben transcurrir más de 3 horas durante el día o 5 horas durante la noche sin que se amamante a los recién nacidos. °· Debe amamantar al bebé un mínimo de 8 veces en un período de 24 horas. °Extracción de leche materna ° °  ° °La extracción y el almacenamiento de la leche materna le permiten asegurarse de que el bebé se alimente exclusivamente de su leche materna, aun en momentos en los que no puede amamantar. Esto tiene especial importancia si debe regresar al trabajo en el período en que aún está amamantando o si no puede estar presente en los momentos en que el bebé debe alimentarse. Su asesor en lactancia puede ayudarla a encontrar un método de extracción que funcione mejor para usted y orientarla sobre cuánto tiempo es seguro almacenar leche materna. °Cómo cuidar las mamas durante la lactancia °Los pezones pueden secarse, agrietarse y doler  durante la lactancia. Las siguientes recomendaciones pueden ayudarla a mantener las mamas humectadas y sanas: °· Evite usar jabón en los pezones. °· Use un sostén de soporte diseñado especialmente para la lactancia materna. Evite usar sostenes con aro o sostenes muy ajustados (sostenes deportivos). °· Seque al aire sus pezones durante 3 a 4 minutos después de amamantar al bebé. °· Utilice solo apósitos de algodón en el sostén para absorber las pérdidas de leche. La pérdida de un poco de leche materna entre   las tomas es normal. °· Utilice lanolina sobre los pezones luego de amamantar. La lanolina ayuda a mantener la humedad normal de la piel. La lanolina pura no es perjudicial (no es tóxica) para el bebé. Además, puede extraer manualmente algunas gotas de leche materna y masajear suavemente esa leche sobre los pezones para que la leche se seque al aire. °Durante las primeras semanas después del nacimiento, algunas mujeres experimentan congestión mamaria. La congestión mamaria puede hacer que sienta las mamas pesadas, calientes y sensibles al tacto. El pico de la congestión mamaria ocurre en el plazo de los 3 a 5 días después del parto. Las siguientes recomendaciones pueden ayudarla a aliviar la congestión mamaria: °· Vacíe por completo las mamas al amamantar o extraer leche. Puede aplicar calor húmedo en las mamas (en la ducha o con toallas húmedas para manos) antes de amamantar o extraer leche. Esto aumenta la circulación y ayuda a que la leche fluya. Si el bebé no vacía por completo las mamas cuando lo amamanta, extraiga la leche restante después de que haya finalizado. °· Aplique compresas de hielo sobre las mamas inmediatamente después de amamantar o extraer leche, a menos que le resulte demasiado incómodo. Haga lo siguiente: °? Ponga el hielo en una bolsa plástica. °? Coloque una toalla entre la piel y la bolsa de hielo. °? Coloque el hielo durante 20 minutos, 2 o 3 veces por día. °· Asegúrese de que el bebé  esté prendido y se encuentre en la posición correcta mientras lo alimenta. °Si la congestión mamaria persiste luego de 48 horas o después de seguir estas recomendaciones, comuníquese con su médico o un asesor en lactancia. °Recomendaciones de salud general durante la lactancia °· Consuma 3 comidas y 3 colaciones saludables todos los días. Las madres bien alimentadas que amamantan necesitan entre 450 y 500 calorías adicionales por día. Puede cumplir con este requisito al aumentar la cantidad de una dieta equilibrada que realice. °· Beba suficiente agua para mantener la orina clara o de color amarillo pálido. °· Descanse con frecuencia, relájese y siga tomando sus vitaminas prenatales para prevenir la fatiga, el estrés y los niveles bajos de vitaminas y minerales en el cuerpo (deficiencias de nutrientes). °· No consuma ningún producto que contenga nicotina o tabaco, como cigarrillos y cigarrillos electrónicos. El bebé puede verse afectado por las sustancias químicas de los cigarrillos que pasan a la leche materna y por la exposición al humo ambiental del tabaco. Si necesita ayuda para dejar de fumar, consulte al médico. °· Evite el consumo de alcohol. °· No consuma drogas ilegales o marihuana. °· Antes de usar cualquier medicamento, hable con el médico. Estos incluyen medicamentos recetados y de venta libre, como también vitaminas y suplementos a base de hierbas. Algunos medicamentos, que pueden ser perjudiciales para el bebé, pueden pasar a través de la leche materna. °· Puede quedar embarazada durante la lactancia. Si se desea un método anticonceptivo, consulte al médico sobre cuáles son las opciones seguras durante la lactancia. °Dónde encontrar más información: °Liga internacional [place]: www.llli.org. °Comuníquese con un médico si: °· Siente que quiere dejar de amamantar o se siente frustrada con la lactancia. °· Sus pezones están agrietados o sangran. °· Sus mamas están irritadas, sensibles o  calientes. °· Tiene los siguientes síntomas: °? Dolor en las mamas o en los pezones. °? Un área hinchada en cualquiera de las mamas. °? Fiebre o escalofríos. °? Náuseas o vómitos. °? Drenaje de otro líquido distinto de la leche materna desde los pezones. °· Sus mamas no   se llenan antes de amamantar al bebé para el quinto día después del parto. °· Se siente triste y deprimida. °· El bebé: °? Está demasiado somnoliento como para comer bien. °? Tiene problemas para dormir. °? Tiene más de 1 semana de vida y moja menos de 6 pañales en un periodo de 24 horas. °? No ha aumentado de peso a los 5 días de vida. °· El bebé defeca menos de 3 veces en 24 horas. °· La piel del bebé o las partes blancas de los ojos se vuelven amarillentas. °Solicite ayuda de inmediato si: °· El bebé está muy cansado (letargo) y no se quiere despertar para comer. °· Le sube la fiebre sin causa. °Resumen °· La lactancia materna ofrece muchos beneficios de salud para bebés y madres. °· Intente amamantar a su bebé cuando muestre signos tempranos de hambre. °· Haga cosquillas o empuje suavemente los labios del bebé con el dedo o el pezón para lograr que el bebé abra la boca. Acerque el bebé a la mama. Asegúrese de que la mayor parte de la aréola se encuentre dentro de la boca del bebé. Ofrézcale una mama y haga eructar al bebé antes de pasar a la otra. °· Hable con su médico o asesor en lactancia si tiene dudas o problemas con la lactancia. °Esta información no tiene como fin reemplazar el consejo del médico. Asegúrese de hacerle al médico cualquier pregunta que tenga. °Document Released: 09/15/2005 Document Revised: 12/10/2017 Document Reviewed: 01/05/2017 °Elsevier Patient Education © 2020 [employer] ° °

## 2019-04-07 NOTE — Progress Notes (Signed)
NST:  Baseline: 150 bpm, Variability: Good {> 6 bpm), Accelerations: Reactive and Decelerations: Absent    

## 2019-04-07 NOTE — Progress Notes (Signed)
   PRENATAL VISIT NOTE  Subjective:  Michelle Cross is a 38 y.o. [id number] at [redacted]w[redacted]d being seen today for ongoing prenatal care.  She is currently monitored for the following issues for this high-risk pregnancy and has GDM, class A2; Supervision of high risk pregnancy, antepartum; History of preterm delivery, currently pregnant; History of gestational diabetes in prior pregnancy, currently pregnant; Language barrier; and Multigravida of advanced maternal age on their problem list.  Patient reports no complaints.  Contractions: Irregular. Vag. Bleeding: None.  Movement: Present. Denies leaking of fluid.   The following portions of the patient's history were reviewed and updated as appropriate: allergies, current medications, past family history, past medical history, past social history, past surgical history and problem list.   Objective:   Vitals:   04/07/19 0953  BP: 108/66  Pulse: 94  Weight: 167 lb (75.8 kg)    Fetal Status: Fetal Heart Rate (bpm): NST   Movement: Present     General:  Alert, oriented and cooperative. Patient is in no acute distress.  Skin: Skin is warm and dry. No rash noted.   Cardiovascular: Normal heart rate noted  Respiratory: Normal respiratory effort, no problems with respiration noted  Abdomen: Soft, gravid, appropriate for gestational age.  Pain/Pressure: Present     Pelvic: Cervical exam deferred        Extremities: Normal range of motion.  Edema: None  Mental Status: Normal mood and affect. Normal behavior. Normal judgment and thought content.  NST:  Baseline: 150 bpm, Variability: Good {> 6 bpm), Accelerations: Reactive and Decelerations: Absent   Assessment and Plan:  Pregnancy: [id number] at [redacted]w[redacted]d 1. GDM, class A2 FBS 85-101 (3 of 7 out of range) 2 hour pp 89-122 (2 of 21 out of range) [facility] at [facility] 6/15--normal growth 65% Continue insulin  2. Supervision of high risk pregnancy, antepartum Cultures today - Culture, beta strep (group b only) -  GC/Chlamydia probe amp ([place])not at [facility]  3. History of preterm delivery, currently pregnant No signs of PTL  4. Language barrier Spanish interpreter: Mariel used   5. Multigravida of advanced maternal age in third trimester Declined genetics  Preterm labor symptoms and general obstetric precautions including but not limited to vaginal bleeding, contractions, leaking of fluid and fetal movement were reviewed in detail with the patient. Please refer to After Visit Summary for other counseling recommendations.   Return in 1 week (on 04/14/2019) for OB visit and BPP, in person.  Future Appointments  Date Time Provider [place]  04/14/2019  9:15 AM WOC-WOCA NST [place] W[facility]  04/14/2019 10:15 AM Woodroe Mode, MD [place]  04/21/2019  9:15 AM WOC-WOCA NST WOC-WOCA WOC  04/21/2019 10:15 AM Emily Filbert, MD WOC-WOCA WOC    Donnamae Jude, MD

## 2019-04-08 LAB — GC/CHLAMYDIA PROBE AMP (~~LOC~~) NOT AT ARMC
Chlamydia: NEGATIVE
Neisseria Gonorrhea: NEGATIVE

## 2019-04-12 LAB — CULTURE, BETA STREP (GROUP B ONLY): Strep Gp B Culture: NEGATIVE

## 2019-04-14 ENCOUNTER — Ambulatory Visit: Payer: Self-pay

## 2019-04-14 ENCOUNTER — Ambulatory Visit (INDEPENDENT_AMBULATORY_CARE_PROVIDER_SITE_OTHER): Payer: Self-pay | Admitting: Obstetrics & Gynecology

## 2019-04-14 ENCOUNTER — Encounter (INDEPENDENT_AMBULATORY_CARE_PROVIDER_SITE_OTHER): Payer: Medicaid Other | Admitting: *Deleted

## 2019-04-14 ENCOUNTER — Other Ambulatory Visit: Payer: Self-pay | Admitting: Obstetrics & Gynecology

## 2019-04-14 ENCOUNTER — Encounter: Payer: Self-pay | Admitting: *Deleted

## 2019-04-14 ENCOUNTER — Ambulatory Visit (INDEPENDENT_AMBULATORY_CARE_PROVIDER_SITE_OTHER): Payer: Medicaid Other | Admitting: *Deleted

## 2019-04-14 ENCOUNTER — Other Ambulatory Visit: Payer: Self-pay

## 2019-04-14 VITALS — BP 113/74 | HR 98

## 2019-04-14 VITALS — BP 120/82 | HR 89 | Wt 168.0 lb

## 2019-04-14 DIAGNOSIS — O0990 Supervision of high risk pregnancy, unspecified, unspecified trimester: Secondary | ICD-10-CM

## 2019-04-14 DIAGNOSIS — O24419 Gestational diabetes mellitus in pregnancy, unspecified control: Secondary | ICD-10-CM | POA: Diagnosis not present

## 2019-04-14 DIAGNOSIS — Z3A37 37 weeks gestation of pregnancy: Secondary | ICD-10-CM | POA: Diagnosis not present

## 2019-04-14 DIAGNOSIS — O09899 Supervision of other high risk pregnancies, unspecified trimester: Secondary | ICD-10-CM

## 2019-04-14 DIAGNOSIS — O0993 Supervision of high risk pregnancy, unspecified, third trimester: Secondary | ICD-10-CM

## 2019-04-14 DIAGNOSIS — Z789 Other specified health status: Secondary | ICD-10-CM

## 2019-04-14 DIAGNOSIS — O09213 Supervision of pregnancy with history of pre-term labor, third trimester: Secondary | ICD-10-CM

## 2019-04-14 NOTE — Progress Notes (Signed)
   PRENATAL VISIT NOTE  Subjective:  Michelle Cross is a 38 y.o. [id number] at [redacted]w[redacted]d being seen today for ongoing prenatal care.  She is currently monitored for the following issues for this high-risk pregnancy and has GDM, class A2; Supervision of high risk pregnancy, antepartum; History of preterm delivery, currently pregnant; History of gestational diabetes in prior pregnancy, currently pregnant; Language barrier; and Multigravida of advanced maternal age on their problem list.  Patient reports no complaints.  Contractions: Irregular. Vag. Bleeding: None.  Movement: Present. Denies leaking of fluid.   The following portions of the patient's history were reviewed and updated as appropriate: allergies, current medications, past family history, past medical history, past social history, past surgical history and problem list.   Objective:   Vitals:   04/14/19 1004  BP: 120/82  Pulse: 89  Weight: 168 lb (76.2 kg)    Fetal Status: Fetal Heart Rate (bpm): 152   Movement: Present     General:  Alert, oriented and cooperative. Patient is in no acute distress.  Skin: Skin is warm and dry. No rash noted.   Cardiovascular: Normal heart rate noted  Respiratory: Normal respiratory effort, no problems with respiration noted  Abdomen: Soft, gravid, appropriate for gestational age.  Pain/Pressure: Present     Pelvic: Cervical exam deferred        Extremities: Normal range of motion.  Edema: None  Mental Status: Normal mood and affect. Normal behavior. Normal judgment and thought content.   Assessment and Plan:  Pregnancy: [id number] at [redacted]w[redacted]d 1. GDM, class A2 IOL 39 weeks  2. Supervision of high risk pregnancy, antepartum GDM fair control nl PP  3. Language barrier Spanish interpreter  4. History of preterm delivery, currently pregnant   Term labor symptoms and general obstetric precautions including but not limited to vaginal bleeding, contractions, leaking of fluid and fetal movement were  reviewed in detail with the patient. Please refer to After Visit Summary for other counseling recommendations.   No follow-ups on file.  Future Appointments  Date Time Provider [place]  04/14/2019 10:35 AM [facility] IMAGING [place] WOC  04/21/2019  9:15 AM WOC-WOCA NST WOC-WOCA WOC  04/21/2019 10:15 AM Emily Filbert, MD [facility]    Emeterio Reeve, MD

## 2019-04-14 NOTE — Progress Notes (Signed)
Induction orders

## 2019-04-14 NOTE — Patient Instructions (Signed)
Induccin del trabajo de parto Labor Induction  Se denomina induccin del trabajo de parto cuando se inician acciones para hacer que una mujer embarazada comience el [place] de [place]. La [employer] de las mujeres comienzan el trabajo de parto de forma natural entre las semanas 22 y 88 del Media planner. Cuando esto no ocurre o cuando por necesidad mdica se debe iniciarlo por otros medios, se podran Event organiser. La induccin del trabajo de parto hace que el tero se contraiga. Tambin hace que el cuello uterino se ablande (madure), se abra (se dilate), y se afine (se borre). Generalmente, el trabajo de parto no se induce antes de las 39semanas, excepto que haya un motivo mdico para Nature conservation officer. El mdico determinar si se debe inducir el [place]. Antes de inducir el trabajo de [place], el mdico considerar ciertos factores; entre ellos:  Su cuadro clnico y el del beb.  En cul semana del embarazo se encuentra.  La madurez de los pulmones del beb.  El estado del cuello uterino.  La posicin del beb.  El tamao del canal del parto. Cules son algunos de los motivos para inducir un parto? Se podra inducir un parto en los siguientes casos:  Su salud o la salud del beb estn en riesgo.  El embarazo se pas de trmino 1semana o ms.  Rompi la bolsa, pero no se inici el trabajo de parto de forma natural.  La cantidad de lquido amnitico que rodea al beb es poca. Tambin podra optar por (elegir) que le induzcan el trabajo de parto en un determinado momento. Por lo general, la induccin del trabajo de parto por eleccin no se hace antes de las 39semanas del [place]. Qu mtodos se usan para inducir el [place]? Los mtodos utilizados para inducir el trabajo de parto incluyen los siguientes:  Administracin de prostaglandina. Este medicamento hace que se inicien las contracciones y que el cuello uterino se dilate y [place]. Puede tomarse por boca (de forma oral) o  introducirse en la vagina (supositorio).  Insercin de un pequeo tubo delgado (catter) que tiene un baln en el extremo en la vagina; luego, expansin del baln con agua para dilatar el cuello uterino.  Ruptura de las [place]. En este mtodo, el mdico separa, con cuidado, el tejido del saco amnitico del cuello uterino. En consecuencia, el cuello uterino se expande, lo que, a la vez, provoca la liberacin de una hormona llamada progesterona. Esta hormona hace que el tero se contraiga. Este procedimiento suele Associate Professor del mdico; luego, la enviarn a su hogar para esperar a que Nature conservation officer.  Romper la bolsa de las aguas. En este mtodo, el mdico [place] un pequeo instrumento para hacer un pequeo orificio en el saco amnitico. Al cabo de un tiempo, esto har que el saco amnitico se rompa. Las contracciones deberan comenzar algunas horas despus.  Medicamentos que desencadenen o [place] contracciones. Estos se administran a travs de una va intravenosa (IV) que se coloca en una vena del brazo. Excepto la ruptura de las [place], que puede realizarse en una [place], la induccin del [place] de parto se realiza en el hospital para que puedan controlarlos con atencin a usted y al beb. Cunto tiempo lleva inducir el [place] de parto? La duracin del proceso de induccin depende de la preparacin del cuerpo para el trabajo de [place]. Algunas inducciones pueden durar hasta 2 o 3das, mientras que otras podran durar menos de Optician, dispensing. La induccin podra durar ms en los  siguientes casos:  La induccin se hace en una etapa temprana del embarazo.  Es Production designer, theatre/television/film de la [place].  El cuello uterino no est listo. Cules son algunos de los riesgos asociados con la induccin del [place] de [place]? Algunos de los riesgos asociados con la induccin del [place] de parto son los siguientes:  Cambios en la frecuencia cardaca fetal, por ejemplo, los  latidos son demasiado rpidos o lentos, o son irregulares (errticos).  Fracaso de la induccin.  Infeccin en la madre o el beb.  Aumento de la posibilidad de que sea necesaria una cesrea.  Muerte fetal.  Ruptura (desprendimiento) de la placenta del tero (raro).  Ruptura del tero (muy poco frecuente). Cuando es Chartered loss adjuster una induccin por motivos mdicos, los beneficios suelen [employer]. Cules son algunos de los motivos para no inducir el [place] de [place]? La induccin no debe realizarse si:  El beb no tolera las contracciones.  Se someti anteriormente a cirugas en el tero, como una miomectoma, le extirparon fibromas o tiene una cicatriz vertical de un parto por cesrea anterior.  La placenta est en una posicin muy baja en el tero y obstruye la abertura del cuello uterino (placenta previa).  El beb no est ubicado con la [employer].  El cordn umbilical cae hacia el canal del parto, adelante del beb.  Hay circunstancias poco habituales, como que se encuentre en una etapa muy temprana del embarazo (beb prematuro).  Tuvo ms de 2partos por cesrea anteriormente. Resumen  Se denomina induccin del trabajo de parto cuando se inician acciones para hacer que una mujer embarazada comience el [place] de [place].  La induccin del trabajo de parto hace que el tero se contraiga. Tambin hace que el cuello uterino se [place], se dilate y se borre.  El [place] de parto no se induce antes de las 39semanas de [place], excepto que haya un motivo mdico para Nature conservation officer.  Cuando es Chartered loss adjuster una induccin por motivos mdicos, los beneficios suelen [employer]. Esta informacin no tiene Marine scientist el consejo del mdico. Asegrese de hacerle al mdico cualquier pregunta que tenga. Document Released: 12/23/2007 Document Revised: 05/26/2017 Document Reviewed: 05/26/2017 Elsevier Patient Education  2020 [employer].

## 2019-04-15 ENCOUNTER — Telehealth (HOSPITAL_COMMUNITY): Payer: Self-pay | Admitting: *Deleted

## 2019-04-15 NOTE — Telephone Encounter (Signed)
Preadmission screen Interpreter number [phone]

## 2019-04-18 ENCOUNTER — Other Ambulatory Visit: Payer: Self-pay | Admitting: Advanced Practice Midwife

## 2019-04-18 NOTE — Telephone Encounter (Signed)
[phone] interpreter number  Preadmission screen

## 2019-04-19 ENCOUNTER — Telehealth (HOSPITAL_COMMUNITY): Payer: Self-pay | Admitting: *Deleted

## 2019-04-19 NOTE — Telephone Encounter (Signed)
Preadmission screen Interpreter number [phone]

## 2019-04-20 ENCOUNTER — Other Ambulatory Visit (HOSPITAL_COMMUNITY)
Admission: RE | Admit: 2019-04-20 | Discharge: 2019-04-20 | Disposition: A | Discharge status: Home / Self Care | Payer: HRSA Program | Source: Ambulatory Visit | Attending: Obstetrics & Gynecology | Admitting: Obstetrics & Gynecology

## 2019-04-20 ENCOUNTER — Other Ambulatory Visit: Payer: Self-pay

## 2019-04-20 DIAGNOSIS — Z1159 Encounter for screening for other viral diseases: Secondary | ICD-10-CM | POA: Insufficient documentation

## 2019-04-20 LAB — SARS CORONAVIRUS 2 (TAT 6-24 HRS): SARS Coronavirus 2: NEGATIVE

## 2019-04-20 NOTE — MAU Note (Signed)
Swab collected without difficulty. 

## 2019-04-21 ENCOUNTER — Ambulatory Visit: Payer: Self-pay

## 2019-04-21 ENCOUNTER — Ambulatory Visit (INDEPENDENT_AMBULATORY_CARE_PROVIDER_SITE_OTHER): Payer: Medicaid Other | Admitting: *Deleted

## 2019-04-21 ENCOUNTER — Ambulatory Visit (INDEPENDENT_AMBULATORY_CARE_PROVIDER_SITE_OTHER): Payer: Self-pay | Admitting: Obstetrics & Gynecology

## 2019-04-21 VITALS — BP 122/77 | HR 88 | Wt 169.6 lb

## 2019-04-21 DIAGNOSIS — Z3A38 38 weeks gestation of pregnancy: Secondary | ICD-10-CM

## 2019-04-21 DIAGNOSIS — O0990 Supervision of high risk pregnancy, unspecified, unspecified trimester: Secondary | ICD-10-CM

## 2019-04-21 DIAGNOSIS — O24419 Gestational diabetes mellitus in pregnancy, unspecified control: Secondary | ICD-10-CM | POA: Diagnosis present

## 2019-04-21 DIAGNOSIS — Z789 Other specified health status: Secondary | ICD-10-CM

## 2019-04-21 DIAGNOSIS — O0993 Supervision of high risk pregnancy, unspecified, third trimester: Secondary | ICD-10-CM

## 2019-04-21 NOTE — Progress Notes (Signed)
Video interpreter Sol Blazing [phone] used for encounter.  IOL scheduled 7/25 @ 0800

## 2019-04-21 NOTE — Progress Notes (Signed)
   PRENATAL VISIT NOTE  Subjective:  Michelle Cross is a 38 y.o. [id number] at [redacted]w[redacted]d being seen today for ongoing prenatal care.  She is currently monitored for the following issues for this high-risk pregnancy and has GDM, class A2; Supervision of high risk pregnancy, antepartum; History of preterm delivery, currently pregnant; History of gestational diabetes in prior pregnancy, currently pregnant; Language barrier; and Multigravida of advanced maternal age on their problem list.  Patient reports no complaints.  Contractions: Irregular. Vag. Bleeding: None.  Movement: Present. Denies leaking of fluid.   The following portions of the patient's history were reviewed and updated as appropriate: allergies, current medications, past family history, past medical history, past social history, past surgical history and problem list.   Objective:   Vitals:   04/21/19 0940  BP: 122/77  Pulse: 88  Weight: 169 lb 9.6 oz (76.9 kg)    Fetal Status: Fetal Heart Rate (bpm): NST   Movement: Present     General:  Alert, oriented and cooperative. Patient is in no acute distress.  Skin: Skin is warm and dry. No rash noted.   Cardiovascular: Normal heart rate noted  Respiratory: Normal respiratory effort, no problems with respiration noted  Abdomen: Soft, gravid, appropriate for gestational age.  Pain/Pressure: Present     Pelvic: Cervical exam deferred        Extremities: Normal range of motion.  Edema: None  Mental Status: Normal mood and affect. Normal behavior. Normal judgment and thought content.   Assessment and Plan:  Pregnancy: [id number] at [redacted]w[redacted]d GDM on insulin, good sugars For IOL in 2 days  Term labor symptoms and general obstetric precautions including but not limited to vaginal bleeding, contractions, leaking of fluid and fetal movement were reviewed in detail with the patient. Please refer to After Visit Summary for other counseling recommendations.   Return in about 6 weeks (around  06/02/2019) for pp exam with 2 hour GTT.  Future Appointments  Date Time Provider [place]  04/23/2019  8:00 AM MC-LD SCHED ROOM MC-INDC None    Emily Filbert, MD

## 2019-04-23 ENCOUNTER — Other Ambulatory Visit: Payer: Self-pay

## 2019-04-23 ENCOUNTER — Inpatient Hospital Stay (HOSPITAL_COMMUNITY): Payer: Medicaid Other

## 2019-04-23 ENCOUNTER — Inpatient Hospital Stay (HOSPITAL_COMMUNITY)
Admission: AD | Admit: 2019-04-23 | Discharge: 2019-04-24 | DRG: 807 | Disposition: A | Discharge status: Home / Self Care | Payer: Medicaid Other | Attending: Obstetrics and Gynecology | Admitting: Obstetrics and Gynecology

## 2019-04-23 ENCOUNTER — Encounter (HOSPITAL_COMMUNITY): Payer: Self-pay

## 2019-04-23 DIAGNOSIS — O24419 Gestational diabetes mellitus in pregnancy, unspecified control: Secondary | ICD-10-CM | POA: Diagnosis present

## 2019-04-23 DIAGNOSIS — Z3A39 39 weeks gestation of pregnancy: Secondary | ICD-10-CM

## 2019-04-23 DIAGNOSIS — O0990 Supervision of high risk pregnancy, unspecified, unspecified trimester: Secondary | ICD-10-CM

## 2019-04-23 DIAGNOSIS — O24424 Gestational diabetes mellitus in childbirth, insulin controlled: Secondary | ICD-10-CM

## 2019-04-23 DIAGNOSIS — O09899 Supervision of other high risk pregnancies, unspecified trimester: Secondary | ICD-10-CM

## 2019-04-23 DIAGNOSIS — O09523 Supervision of elderly multigravida, third trimester: Secondary | ICD-10-CM

## 2019-04-23 DIAGNOSIS — Z789 Other specified health status: Secondary | ICD-10-CM

## 2019-04-23 DIAGNOSIS — O09299 Supervision of pregnancy with other poor reproductive or obstetric history, unspecified trimester: Secondary | ICD-10-CM

## 2019-04-23 LAB — CBC
HCT: 37.3 % (ref 36.0–46.0)
Hemoglobin: 13.4 g/dL (ref 12.0–15.0)
MCH: 31.8 pg (ref 26.0–34.0)
MCHC: 35.9 g/dL (ref 30.0–36.0)
MCV: 88.4 fL (ref 80.0–100.0)
Platelets: 189 10*3/uL (ref 150–400)
RBC: 4.22 MIL/uL (ref 3.87–5.11)
RDW: 13.1 % (ref 11.5–15.5)
WBC: 9.7 10*3/uL (ref 4.0–10.5)
nRBC: 0 % (ref 0.0–0.2)

## 2019-04-23 LAB — TYPE AND SCREEN
ABO/RH(D): B POS
Antibody Screen: NEGATIVE

## 2019-04-23 LAB — GLUCOSE, CAPILLARY
Glucose-Capillary: 140 mg/dL — ABNORMAL HIGH (ref 70–99)
Glucose-Capillary: 97 mg/dL (ref 70–99)

## 2019-04-23 LAB — ABO/RH: ABO/RH(D): B POS

## 2019-04-23 MED ORDER — EPHEDRINE 5 MG/ML INJ: 10.0000 mg | INTRAVENOUS | Status: DC | PRN

## 2019-04-23 MED ORDER — BENZOCAINE-MENTHOL 20-0.5 % EX AERO: 1.0000 "application " | INHALATION_SPRAY | CUTANEOUS | Status: DC | PRN

## 2019-04-23 MED ORDER — LACTATED RINGERS IV SOLN
INTRAVENOUS | Status: DC
  Administered 2019-04-23 (×2): via INTRAVENOUS

## 2019-04-23 MED ORDER — IBUPROFEN 600 MG PO TABS
600.0000 mg | ORAL_TABLET | Freq: Four times a day (QID) | ORAL | Status: DC
  Administered 2019-04-23 – 2019-04-24 (×4): 600 mg via ORAL
  Filled 2019-04-23 (×5): qty 1

## 2019-04-23 MED ORDER — PHENYLEPHRINE 40 MCG/ML (10ML) SYRINGE FOR IV PUSH (FOR BLOOD PRESSURE SUPPORT): 80.0000 ug | PREFILLED_SYRINGE | INTRAVENOUS | Status: DC | PRN

## 2019-04-23 MED ORDER — ONDANSETRON HCL 4 MG/2ML IJ SOLN: 4.0000 mg | Freq: Four times a day (QID) | INTRAMUSCULAR | Status: DC | PRN

## 2019-04-23 MED ORDER — TERBUTALINE SULFATE 1 MG/ML IJ SOLN: 0.2500 mg | Freq: Once | INTRAMUSCULAR | Status: DC | PRN

## 2019-04-23 MED ORDER — ACETAMINOPHEN 325 MG PO TABS: 650.0000 mg | ORAL_TABLET | ORAL | Status: DC | PRN

## 2019-04-23 MED ORDER — PRENATAL MULTIVITAMIN CH
1.0000 | ORAL_TABLET | Freq: Every day | ORAL | Status: DC
  Administered 2019-04-24: 1 via ORAL
  Filled 2019-04-23: qty 1

## 2019-04-23 MED ORDER — SIMETHICONE 80 MG PO CHEW: 80.0000 mg | CHEWABLE_TABLET | ORAL | Status: DC | PRN

## 2019-04-23 MED ORDER — DIPHENHYDRAMINE HCL 50 MG/ML IJ SOLN: 12.5000 mg | INTRAMUSCULAR | Status: DC | PRN

## 2019-04-23 MED ORDER — ONDANSETRON HCL 4 MG/2ML IJ SOLN: 4.0000 mg | INTRAMUSCULAR | Status: DC | PRN

## 2019-04-23 MED ORDER — DIPHENHYDRAMINE HCL 25 MG PO CAPS: 25.0000 mg | ORAL_CAPSULE | Freq: Four times a day (QID) | ORAL | Status: DC | PRN

## 2019-04-23 MED ORDER — SENNOSIDES-DOCUSATE SODIUM 8.6-50 MG PO TABS
2.0000 | ORAL_TABLET | ORAL | Status: DC
  Administered 2019-04-24: 01:00:00 2 via ORAL
  Filled 2019-04-23: qty 2

## 2019-04-23 MED ORDER — ONDANSETRON HCL 4 MG PO TABS: 4.0000 mg | ORAL_TABLET | ORAL | Status: DC | PRN

## 2019-04-23 MED ORDER — FENTANYL-BUPIVACAINE-NACL 0.5-0.125-0.9 MG/250ML-% EP SOLN: 12.0000 mL/h | EPIDURAL | Status: DC | PRN

## 2019-04-23 MED ORDER — OXYTOCIN 40 UNITS IN NORMAL SALINE INFUSION - SIMPLE MED
1.0000 m[IU]/min | INTRAVENOUS | Status: DC
  Administered 2019-04-23: 2 m[IU]/min via INTRAVENOUS

## 2019-04-23 MED ORDER — MISOPROSTOL 25 MCG QUARTER TABLET
ORAL_TABLET | ORAL | Status: AC
  Filled 2019-04-23: qty 1

## 2019-04-23 MED ORDER — OXYTOCIN BOLUS FROM INFUSION
500.0000 mL | Freq: Once | INTRAVENOUS | Status: AC
  Administered 2019-04-23: 500 mL via INTRAVENOUS

## 2019-04-23 MED ORDER — LACTATED RINGERS IV SOLN: 500.0000 mL | INTRAVENOUS | Status: DC | PRN

## 2019-04-23 MED ORDER — ZOLPIDEM TARTRATE 5 MG PO TABS: 5.0000 mg | ORAL_TABLET | Freq: Every evening | ORAL | Status: DC | PRN

## 2019-04-23 MED ORDER — LACTATED RINGERS IV SOLN
500.0000 mL | Freq: Once | INTRAVENOUS | Status: AC
  Administered 2019-04-23: 500 mL via INTRAVENOUS

## 2019-04-23 MED ORDER — WITCH HAZEL-GLYCERIN EX PADS: 1.0000 "application " | MEDICATED_PAD | CUTANEOUS | Status: DC | PRN

## 2019-04-23 MED ORDER — OXYTOCIN 40 UNITS IN NORMAL SALINE INFUSION - SIMPLE MED
2.5000 [IU]/h | INTRAVENOUS | Status: DC
  Filled 2019-04-23: qty 1000

## 2019-04-23 MED ORDER — COCONUT OIL OIL: 1.0000 "application " | TOPICAL_OIL | Status: DC | PRN

## 2019-04-23 MED ORDER — LIDOCAINE HCL (PF) 1 % IJ SOLN: 30.0000 mL | INTRAMUSCULAR | Status: DC | PRN

## 2019-04-23 MED ORDER — TETANUS-DIPHTH-ACELL PERTUSSIS 5-2.5-18.5 LF-MCG/0.5 IM SUSP: 0.5000 mL | Freq: Once | INTRAMUSCULAR | Status: DC

## 2019-04-23 MED ORDER — DIBUCAINE (PERIANAL) 1 % EX OINT: 1.0000 "application " | TOPICAL_OINTMENT | CUTANEOUS | Status: DC | PRN

## 2019-04-23 NOTE — H&P (Signed)
OBSTETRIC ADMISSION HISTORY AND PHYSICAL  Michelle Cross is a 38 y.o. female [phone] with IUP at 55w1dby LMP presenting for IOL for A2GDM on insulin. She reports +FMs, No LOF, no VB, no blurry vision, headaches or peripheral edema, and RUQ pain.  She plans on breast feeding. She request condoms for birth control. [facility]  Dating: By LMP --->  Estimated Date of Delivery: 04/29/19  Nursing Staff Provider  Office Location  [place]   L[place]  Language  Spanish Anatomy [place]  normal female  Flu Vaccine  [place] [facility]  declined   TDaP vaccine    Hgb A1C or  GTT Early  Third trimester patient failed early 1 hour of with a value of 247 ta 20 weeks; now considered gestational diabetic, may actually be Type 2 DM (discussed with Dr. DHulan Frayand will treat as GDM for now)  Rhogam   n/a   LAB RESULTS   Feeding Plan Breast Blood Type B/Positive/-- (03/10 1656)   Contraception Undecided Antibody Negative (03/10 1656)  Circumcision No Rubella 1.58 (03/10 1656)  Pediatrician  [place] [facility] health RPR Non Reactive (03/10 1656)   Support Person Michelle CalamiaHBsAg Negative (03/10 1656)   Prenatal Classes  HIV Non Reactive (03/10 1656)  BTL Consent   GBS  Negative  VBAC Consent  N/a Pap  Neg March 2020    Hgb Electro      CF     SMA     Waterbirth  _0  Class _1  Consent _2  CNM visit    Prenatal History/Complications:  Past Medical History: Past Medical History:  Diagnosis Date  . Anxiety   . Depression   . Gestational diabetes    glyburide w/1st, diet control w/2nd, 3rd & 4th  . Preterm labor    delivery    Past Surgical History: Past Surgical History:  Procedure Laterality Date  . APPENDECTOMY      Obstetrical History: OB History    Gravida  5   Para  4   Term  3   Preterm  1   AB      Living  4     SAB      TAB      Ectopic      Multiple  0   Live Births  4           Social History: Social History    Socioeconomic History  . Marital status: Married    Spouse name: Michelle Cross . Number of children: 4  . Years of education: Not on file  . Highest education level: Not on file  Occupational History  . Not on file  Social Needs  . Financial resource strain: Somewhat hard  . Food insecurity    Worry: Never true    Inability: Never true  . Transportation needs    Medical: No    Non-medical: No  Tobacco Use  . Smoking status: Never Smoker  . Smokeless tobacco: Never Used  Substance and Sexual Activity  . Alcohol use: Not Currently    Alcohol/week: 1.0 standard drinks    Types: 1 Glasses of wine per week    Comment: occasional  . Drug use: No  . Sexual activity: Yes    Birth control/protection: Condom  Lifestyle  . Physical activity    Days per week: 7 days    Minutes per session: 20 min  .  Stress: Only a little  Relationships  . Social connections    Talks on phone: More than three times a week    Gets together: Once a week    Attends religious service: Never    Active member of club or organization: No    Attends meetings of clubs or organizations: Never    Relationship status: Married  Other Topics Concern  . Not on file  Social History Narrative  . Not on file    Family History: Family History  Problem Relation Age of Onset  . Diabetes Father   . Diabetes Brother   . Hypertension Brother   . Anesthesia problems Neg Hx     Allergies: No Known Allergies  Facility-Administered Medications Prior to Admission  Medication Dose Route Frequency Provider Last Rate Last Dose  . insulin starter kit- syringes (English) 1 kit  1 kit Other Once [place], Wilhemina Cash, MD       Medications Prior to Admission  Medication Sig Dispense Refill Last Dose  . aspirin EC 81 MG tablet Take 1 tablet (81 mg total) by mouth daily. 30 tablet 7   . insulin NPH Human (NOVOLIN N) 100 UNIT/ML injection Inject SQ 22 units every morning with breakfast and 10 units at bedtime. 10 mL 3    . insulin regular (NOVOLIN R) 100 units/mL injection Inject SQ 8 units with breakfast and 8 units with supper (evening meal) (Patient taking differently: Inject SQ 10 units with breakfast and 10 units with supper (evening meal)) 10 mL 11   . Insulin Syringe-Needle U-100 (INSULIN SYRINGE .5CC/31GX5/16") 31G X 5/16" 0.5 ML MISC 1 Device by Does not apply route 3 (three) times daily. 90 each 3   . omeprazole (PRILOSEC) 20 MG capsule Take 1 capsule (20 mg total) by mouth daily. 30 capsule 6   . Prenatal Multivit-Min-Fe-FA (PRENATAL VITAMINS PO) Take 1 tablet by mouth daily.       Review of Systems   All systems reviewed and negative except as stated in HPI  Blood pressure 118/72, pulse 90, height _0  (1.549 m), weight 77 kg, last menstrual period 07/23/2018, unknown if currently breastfeeding. General appearance: alert, cooperative and no distress Lungs: clear to auscultation bilaterally Heart: regular rate and rhythm Abdomen: soft, non-tender; bowel sounds normal Pelvic: n/a Extremities: Homans sign is negative, no sign of DVT DTR's +2 Presentation: cephalic- confirmed with bedside u/s Fetal monitoringBaseline: 140 bpm, Variability: Good {> 6 bpm), Accelerations: Reactive and Decelerations: Absent Uterine activityFrequency: occasional uc's Dilation: 2 Effacement (%): 50 Exam by:: Len Blalock, CNM   Prenatal labs: ABO, Rh: --/--/B POS, B POS Performed at [facility], [address], [place], [place] [address]  [phone] 0810) Antibody: NEG (07/25 0810) Rubella: 1.58 (03/10 1656) RPR: Non Reactive (03/10 1656)  HBsAg: Negative (03/10 1656)  HIV: Non Reactive (03/10 1656)  GBS:   Negative  Prenatal Transfer Tool  Maternal Diabetes: Yes:  Diabetes Type:  Insulin/Medication controlled Genetic Screening: Declined Maternal Ultrasounds/Referrals: Normal Fetal Ultrasounds or other Referrals:  Referred to Materal Fetal Medicine  Maternal Substance Abuse:  No Significant  Maternal Medications:  Meds include: Other: Insulin Significant Maternal Lab Results: Group B Strep negative  Results for orders placed or performed during the hospital encounter of 04/23/19 (from the past 24 hour(s))  CBC   Collection Time: 04/23/19  8:10 AM  Result Value Ref Range   WBC 9.7 4.0 - 10.5 K/uL   RBC 4.22 3.87 - 5.11 MIL/uL   Hemoglobin  13.4 12.0 - 15.0 g/dL   HCT 37.3 36.0 - 46.0 %   MCV 88.4 80.0 - 100.0 fL   MCH 31.8 26.0 - 34.0 pg   MCHC 35.9 30.0 - 36.0 g/dL   RDW 13.1 11.5 - 15.5 %   Platelets 189 150 - 400 K/uL   nRBC 0.0 0.0 - 0.2 %  Type and screen   Collection Time: 04/23/19  8:10 AM  Result Value Ref Range   ABO/RH(D) B POS    Antibody Screen NEG    Sample Expiration      04/26/2019,2359 Performed at [facility], [place] [address], [place], [place] [address]   ABO/Rh   Collection Time: 04/23/19  8:10 AM  Result Value Ref Range   ABO/RH(D)      B POS Performed at [place] [address] [place],  [address]   Glucose, capillary   Collection Time: 04/23/19  8:34 AM  Result Value Ref Range   Glucose-Capillary 140 (H) 70 - 99 mg/dL    Patient Active Problem List   Diagnosis Date Noted  . Gestational diabetes mellitus (GDM) affecting pregnancy, antepartum 04/23/2019  . Supervision of high risk pregnancy, antepartum 12/08/2018  . History of preterm delivery, currently pregnant 12/08/2018  . History of gestational diabetes in prior pregnancy, currently pregnant 12/08/2018  . Language barrier 12/08/2018  . Multigravida of advanced maternal age 76/07/2019  . GDM, class A2 05/13/2012    Assessment/Plan:  Tanika Bracco is a 38 y.o. [id number] at 52w1dhere for IOL for A2GDM   #Labor: Patient reports a hx of rapid labors with last baby being born 333minutes after starting IOL. Will start pitocin.  Patient also reports hx of all babies being greater than 9lbs and feeling like this baby is bigger.  #Pain: Per patient  request #FWB: Cat 1 #ID:  GBS neg #MOF: Breast #MOC: Condoms #Circ:  No  CWende Mott CNM  04/23/2019, 10:03 AM

## 2019-04-23 NOTE — Plan of Care (Signed)
  Problem: Education: Goal: Ability to make informed decisions regarding treatment and plan of care will improve Outcome: Progressing   Problem: Role Relationship: Goal: Will demonstrate positive interactions with the child Outcome: Progressing   Problem: Safety: Goal: Risk of complications during labor and delivery will decrease Outcome: Progressing   Problem: Education: Goal: Knowledge of Childbirth will improve Outcome: Completed/Met Goal: Ability to state and carry out methods to decrease the pain will improve Outcome: Completed/Met   Problem: Coping: Goal: Ability to verbalize concerns and feelings about labor and delivery will improve Outcome: Completed/Met   Problem: Life Cycle: Goal: Ability to make normal progression through stages of labor will improve Outcome: Completed/Met Goal: Ability to effectively push during vaginal delivery will improve Outcome: Completed/Met   Problem: Pain Management: Goal: Relief or control of pain from uterine contractions will improve Outcome: Completed/Met

## 2019-04-23 NOTE — Discharge Summary (Signed)
Postpartum Discharge Summary     Patient Name: Michelle Cross DOB: 05/04/1981 MRN: [id number]  Date of admission: 04/23/2019 Delivering Provider:    Date of discharge: 04/24/2019  Admitting diagnosis: pregnancy Intrauterine pregnancy: 5775w1d     Secondary diagnosis:  Active Problems:   Gestational diabetes mellitus (GDM) affecting pregnancy, antepartum  Additional problems: n/a     Discharge diagnosis: Term Pregnancy Delivered and GDM A2                                                                                                Post partum procedures:n/a  Augmentation: Pitocin  Complications: None  Hospital course:  Induction of Labor With Vaginal Delivery   38 y.o. yo [id number] at 6175w1d was admitted to the hospital 04/23/2019 for induction of labor.  Indication for induction: A2 DM.  Patient had an uncomplicated labor course as follows: Pitocin started, SROM and rapid labor from 3cm to compete. Delivered precipitously. Membrane Rupture Time/Date: 11:31 AM ,04/23/2019   Intrapartum Procedures: Episiotomy: None [1]                                         Lacerations:  1st degree [2];Perineal [11]  Patient had delivery of a Viable infant.  Information for the patient's newborn:  Michelle Cross, Michelle Cross [[id number]][[id number]]  Delivery Method: Vaginal, Spontaneous(Filed from Delivery Summary)    04/23/2019  Details of delivery can be found in separate delivery note.  Patient had a routine postpartum course. Patient is discharged home 04/24/19.  Magnesium Sulfate recieved: No BMZ received: No  Physical exam  Vitals:   04/23/19 1545 04/23/19 2104 04/24/19 0030 04/24/19 0517  BP: 120/80 111/74 109/72 106/71  Pulse: 71 (!) 58 72 66  Resp: 18 18 18 18   Temp: 98.1 F (36.7 C) 98.1 F (36.7 C) 98.4 F (36.9 C) 98.2 F (36.8 C)  TempSrc: Oral Oral Oral Oral  SpO2: 100% 99% 99% 99%  Weight:      Height:       General: alert, cooperative and no distress Lochia: appropriate Uterine  Fundus: firm Incision: N/A DVT Evaluation: No significant calf/ankle edema. Labs: Lab Results  Component Value Date   WBC 9.7 04/23/2019   HGB 13.4 04/23/2019   HCT 37.3 04/23/2019   MCV 88.4 04/23/2019   PLT 189 04/23/2019   CMP Latest Ref Rng & Units 04/02/2016  Glucose 65 - 99 mg/dL 696(E100(H)  BUN 6 - 23 mg/dL -  Creatinine 0.4 - 1.2 mg/dL -  Sodium 952135 - 841145 mEq/L -  Potassium 3.5 - 5.1 mEq/L -  Chloride 96 - 112 mEq/L -  CO2 19 - 32 mEq/L -  Calcium 8.4 - 10.5 mg/dL -  Total Protein 6.0 - 8.3 g/dL -  Total Bilirubin 0.3 - 1.2 mg/dL -  Alkaline Phos 39 - 324117 units/L -  AST 0 - 37 units/L -  ALT 0 - 35 units/L -    Discharge instruction: per After Visit Summary and "Baby and Me Booklet".  After visit meds:  Allergies as of 04/24/2019   No Known Allergies     Medication List    TAKE these medications   acetaminophen 325 MG tablet Commonly known as: Tylenol Take 2 tablets (650 mg total) by mouth every 4 (four) hours as needed (for pain scale < 4).   aspirin EC 81 MG tablet Take 1 tablet (81 mg total) by mouth daily.   ibuprofen 600 MG tablet Commonly known as: ADVIL Take 1 tablet (600 mg total) by mouth every 6 (six) hours.   insulin NPH Human 100 UNIT/ML injection Commonly known as: NOVOLIN N Inject SQ 22 units every morning with breakfast and 10 units at bedtime.   insulin regular 100 units/mL injection Commonly known as: NOVOLIN R Inject SQ 8 units with breakfast and 8 units with supper (evening meal) What changed: additional instructions   INSULIN SYRINGE .5CC/31GX5/16" 31G X 5/16" 0.5 ML Misc 1 Device by Does not apply route 3 (three) times daily.   omeprazole 20 MG capsule Commonly known as: PRILOSEC Take 1 capsule (20 mg total) by mouth daily.   PRENATAL VITAMINS PO Take 1 tablet by mouth daily.       Diet: routine diet  Activity: Advance as tolerated. Pelvic rest for 6 weeks.   Outpatient follow up:4 weeks Follow up Appt:No future  appointments. Follow up Visit:  Please schedule this patient for PP visit in: 4 weeks High risk pregnancy complicated by: GDM Delivery mode:  SVD Anticipated Birth Control:  Condoms PP Procedures needed: 2 hour GTT  Schedule Integrated BH visit: no Provider: MD       Newborn Data: Live born female  Birth Weight:   APGAR: 76, 9  Newborn Delivery   Birth date/time: 04/23/2019 12:45:00 Delivery type: Vaginal, Spontaneous      Baby Feeding: Breast Disposition:home with mother   04/24/2019 Gerlene Fee, DO   Midwife attestation I have seen and examined this patient and agree with above documentation in the resident's note.   Michelle Cross is a 38 y.o. [id number] s/p SVD.   Pain is well controlled.  Plan for birth control is condoms.  Method of Feeding: breast   PE:  BP 106/71 (BP Location: Right Arm)   Pulse 66   Temp 98.2 F (36.8 C) (Oral)   Resp 18   Ht 5\' 1"  (1.549 m)   Wt 77 kg   LMP 07/23/2018 (Exact Date)   SpO2 99%   Breastfeeding Unknown   BMI 32.07 kg/m  Gen: well appearing Heart: reg rate Lungs: normal WOB Fundus firm Ext: soft, no pain, no edema  Recent Labs    04/23/19 0810 04/24/19 0421  HGB 13.4 12.6  HCT 37.3 35.6*     Plan: discharge today - postpartum care discussed - f/u clinic in 6 weeks for postpartum visit   Michelle Cross, CNM 8:38 AM

## 2019-04-23 NOTE — Plan of Care (Signed)
  Problem: Education: Goal: Knowledge of General Education information will improve Description: Including pain rating scale, medication(s)/side effects and non-pharmacologic comfort measures Note: Admission education, safety and unit protocols reviewed with patient using stratus interpreter, Violet [phone] (in house interpreter unavailable).

## 2019-04-23 NOTE — Progress Notes (Signed)
MOB was referred for history of depression/anxiety. * Referral screened out by Clinical Social Worker because none of the following criteria appear to apply: ~ History of anxiety/depression during this pregnancy, or of post-partum depression following prior delivery. MOB also denied MH hx when CSW met with MOB on 04/01/2016 (see note from CSW). ~ Diagnosis of anxiety and/or depression within last 3 years OR * MOB's symptoms currently being treated with medication and/or therapy.  Please contact the Clinical Social Worker if needs arise, by MOB request, or if MOB scores greater than 9/yes to question 10 on Edinburgh Postpartum Depression Screen.  Zaidin Blyden Boyd-Gilyard, MSW, LCSW Clinical Social Work [phone] 

## 2019-04-24 LAB — CBC
HCT: 35.6 % — ABNORMAL LOW (ref 36.0–46.0)
Hemoglobin: 12.6 g/dL (ref 12.0–15.0)
MCH: 31.8 pg (ref 26.0–34.0)
MCHC: 35.4 g/dL (ref 30.0–36.0)
MCV: 89.9 fL (ref 80.0–100.0)
Platelets: 162 10*3/uL (ref 150–400)
RBC: 3.96 MIL/uL (ref 3.87–5.11)
RDW: 13.2 % (ref 11.5–15.5)
WBC: 13 10*3/uL — ABNORMAL HIGH (ref 4.0–10.5)
nRBC: 0.2 % (ref 0.0–0.2)

## 2019-04-24 MED ORDER — IBUPROFEN 600 MG PO TABS: 600.0000 mg | ORAL_TABLET | Freq: Four times a day (QID) | ORAL | 0 refills | Status: AC

## 2019-04-24 MED ORDER — ACETAMINOPHEN 325 MG PO TABS: 650.0000 mg | ORAL_TABLET | ORAL | 0 refills | Status: AC | PRN

## 2019-04-24 NOTE — Lactation Note (Signed)
This note was copied from a baby's chart. Lactation Consultation Note Baby 77 hrs old. FOB is interpreting for mom what she may questions. Mom understands, sometimes tells FOB what to tell me. Mom denied need for interpreter. Encouraged mom when she doesn't understand or needs interpreter please ask. Mom agreed.  Mom is breast/formula feeding. Mom is putting baby to the breast then offering formula. Mom states she doesn't have any milk so she is giving formula.  Mom has 13,11,6,3 yr olds that she BF for 6 months each. Mom states breast are tender when hand expressing. Mom has a little glisten of colostrum. Mom has everted nipples.  Newborn behavior, STS, I&O, feeding habits, positioning, breast massage during feeding, obtaining deep latch, cheeks to breast, supply and demand discussed. Encouraged mom to call for assistance if needed. Mom stated baby is feeding well, sometimes hurts.  Call for assistance or questions. Lactation brochure in Spanish given.  Patient Name: Michelle Cross [id number] Date: 04/24/2019 Reason for consult: Initial assessment;Term;Maternal endocrine disorder Type of Endocrine Disorder?: Diabetes   Maternal Data Has patient been taught Hand Expression?: Yes Does the patient have breastfeeding experience prior to this delivery?: Yes  Feeding    LATCH Score       Type of Nipple: Everted at rest and after stimulation  Comfort (Breast/Nipple): Filling, red/small blisters or bruises, mild/mod discomfort(breast tender)        Interventions Interventions: Breast feeding basics reviewed;Hand express;Breast massage  Lactation Tools Discussed/Used WIC Program: Yes   Consult Status Consult Status: Follow-up Date: 04/25/19 Follow-up type: In-patient    Theodoro Kalata 04/24/2019, 4:36 AM

## 2019-04-24 NOTE — Progress Notes (Signed)
POSTPARTUM PROGRESS NOTE  Post Partum Day 1  Subjective:  Michelle Cross is a 38 y.o. H7G9021 s/p SVD at [redacted]w[redacted]d.  She reports she is doing well. No acute events overnight. She denies any problems with ambulating, voiding or po intake. Denies nausea or vomiting.  Pain is well controlled. She is experiencing a mild crampy discomfort with breast feeding, we discussed this is a normal process as she continues to breast feed it will get better. Lochia is apporpriate.  Objective: Blood pressure 106/71, pulse 66, temperature 98.2 F (36.8 C), temperature source Oral, resp. rate 18, height 5\' 1"  (1.549 m), weight 77 kg, last menstrual period 07/23/2018, SpO2 99 %, unknown if currently breastfeeding.  Physical Exam:  General: alert, cooperative and no distress Chest: no respiratory distress Heart: distal pulses intact Abdomen: soft, nontender,  Uterine Fundus: firm, appropriately tender DVT Evaluation: No calf swelling or tenderness Extremities: No edema Skin: warm, dry  Recent Labs    04/23/19 0810 04/24/19 0421  HGB 13.4 12.6  HCT 37.3 35.6*    Assessment/Plan: Michelle Cross is a 38 y.o. [id number] s/p SVD at [redacted]w[redacted]d   PPD#1 - Doing well  Routine postpartum care Contraception: Condoms Feeding: Breast Dispo: Plan for discharge home 04/24/19 if baby is able to be discharged; if not 04/25/19.   LOS: 1 day   [employer], D.O. Family Medicine Resident, PGY-1 04/24/2019, 7:35 AM

## 2019-04-25 LAB — RPR: RPR Ser Ql: NONREACTIVE

## 2019-04-27 NOTE — Progress Notes (Signed)
This encounter was created in error - please disregard.

## 2019-04-28 ENCOUNTER — Encounter: Payer: Self-pay | Admitting: Obstetrics & Gynecology

## 2019-04-28 ENCOUNTER — Other Ambulatory Visit: Payer: Self-pay

## 2019-05-23 ENCOUNTER — Telehealth: Payer: Self-pay | Admitting: Student

## 2019-05-23 NOTE — Telephone Encounter (Signed)
Attempted to call patient w/ Spanish interpreter ID# [phone] about her appointment on 8/25 @ 10:35. No answer, voicemail was left instructing patient that the visit is a virtual visit and she does not have to come to the office. Patient instructed to download the [employer]. Patient instructed to give the office a call with any questions or concerns.

## 2019-05-24 ENCOUNTER — Ambulatory Visit: Payer: Self-pay | Admitting: Student

## 2019-05-24 ENCOUNTER — Other Ambulatory Visit: Payer: Self-pay

## 2019-05-24 NOTE — Progress Notes (Signed)
Called pt w/interpreter Eda Royal for scheduled virtual post partum visit. She did not answer and message was left that I would call back in a few minutes. Second attempt @ 1055 to contact pt for scheduled virtual visit. She did not answer. Appointment will be rescheduled and she will be notified.

## 2019-05-25 NOTE — Progress Notes (Signed)
Patient did not keep appt; was called twice.

## 2019-05-31 ENCOUNTER — Other Ambulatory Visit: Payer: Self-pay | Admitting: *Deleted

## 2019-05-31 DIAGNOSIS — Z8632 Personal history of gestational diabetes: Secondary | ICD-10-CM

## 2019-06-02 ENCOUNTER — Telehealth: Payer: Self-pay | Admitting: Family Medicine

## 2019-06-02 NOTE — Telephone Encounter (Signed)
Spanish interpreter Raquel attempted to call patient about her appointment on 9/4 @ 8:20. No answer, Raquel left a voicemail instructing patient to wear a face mask for the entire appointment and no visitors are allowed. Patient instructed to come fasting. Patient instructed not to attend the appointment if she has any symptoms. Symptom list and office number left.

## 2019-06-03 ENCOUNTER — Encounter: Payer: Self-pay | Admitting: Family Medicine

## 2019-06-03 ENCOUNTER — Other Ambulatory Visit: Payer: Self-pay

## 2019-06-14 ENCOUNTER — Ambulatory Visit: Payer: Self-pay | Admitting: Obstetrics & Gynecology

## 2019-06-24 ENCOUNTER — Other Ambulatory Visit: Payer: Self-pay | Admitting: *Deleted

## 2019-06-24 ENCOUNTER — Telehealth: Payer: Self-pay | Admitting: Nurse Practitioner

## 2019-06-24 DIAGNOSIS — Z8632 Personal history of gestational diabetes: Secondary | ICD-10-CM

## 2019-06-24 NOTE — Telephone Encounter (Signed)
Spanish interpreter Raquel called patient about her appointment on 9/28 @ 9:10. Patient instructed to wear a face mask for the entire appointment and no visitors are allowed. Patient screened for covid symptoms and denied having any.

## 2019-06-27 ENCOUNTER — Ambulatory Visit: Payer: Self-pay | Admitting: Nurse Practitioner

## 2019-06-27 ENCOUNTER — Other Ambulatory Visit: Payer: Self-pay

## 2019-09-22 NOTE — Progress Notes (Signed)
Chart reviewed for nurse visit. Agree with plan of care.   Starr Lake, [place] 09/22/2019 6:09 PM

## 2020-02-06 ENCOUNTER — Encounter: Payer: Self-pay | Admitting: *Deleted

## 2021-02-18 IMAGING — US US FETAL BPP W/NONSTRESS
1 series · 13 of 14 positions shown · non-contrast
Comparison: none

[Series 1: us fetal bpp w/nonstress · 14 acquisitions, 13 frames shown]
[im 1/14]
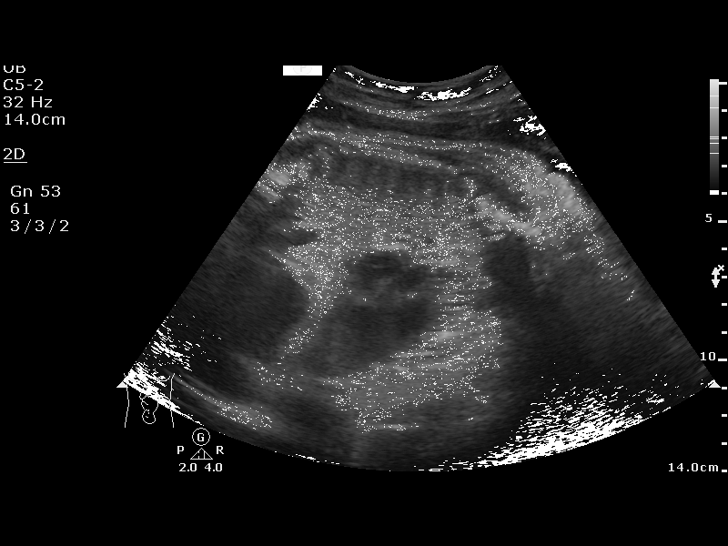
[im 2/14]
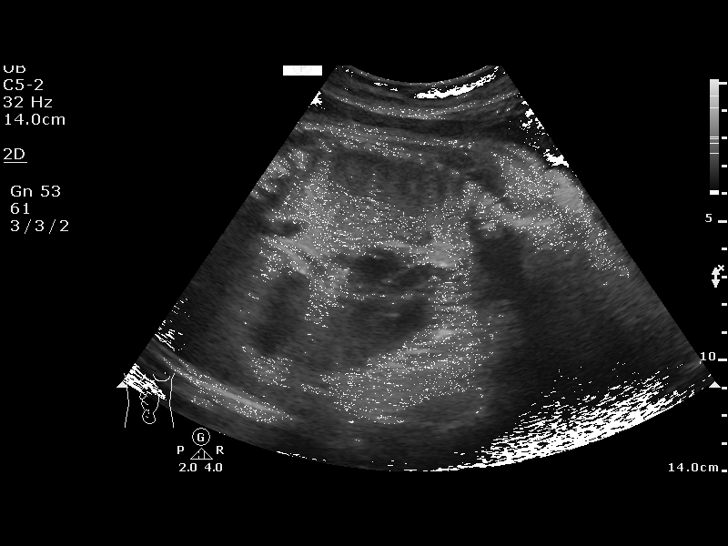
[im 3/14]
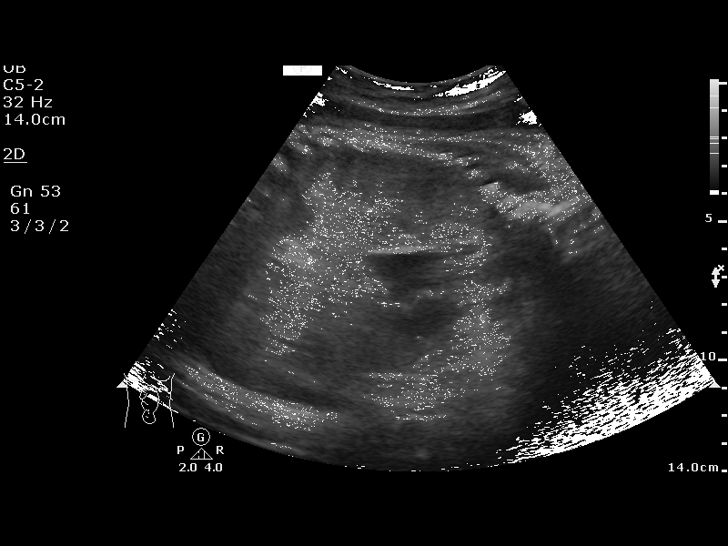
[im 4/14]
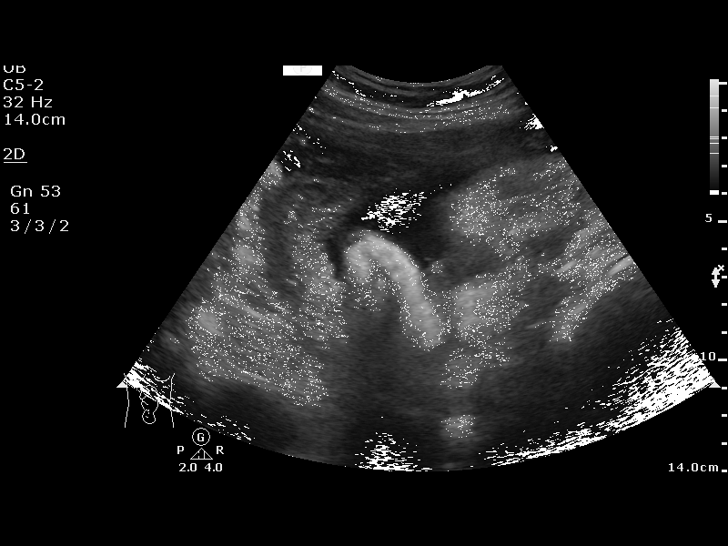
[im 5/14]
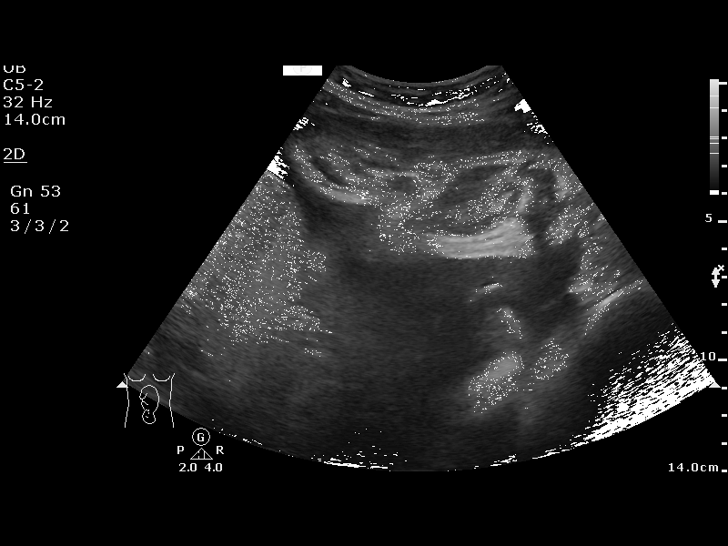
[im 6/14]
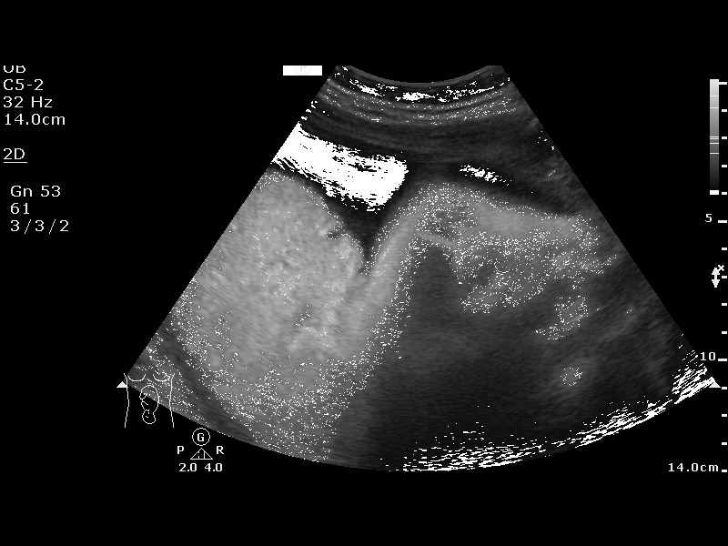
[im 8/14]
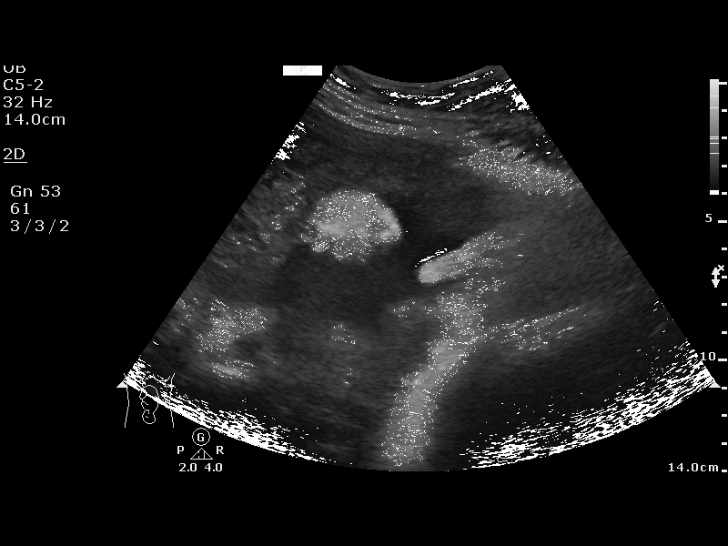
[im 9/14]
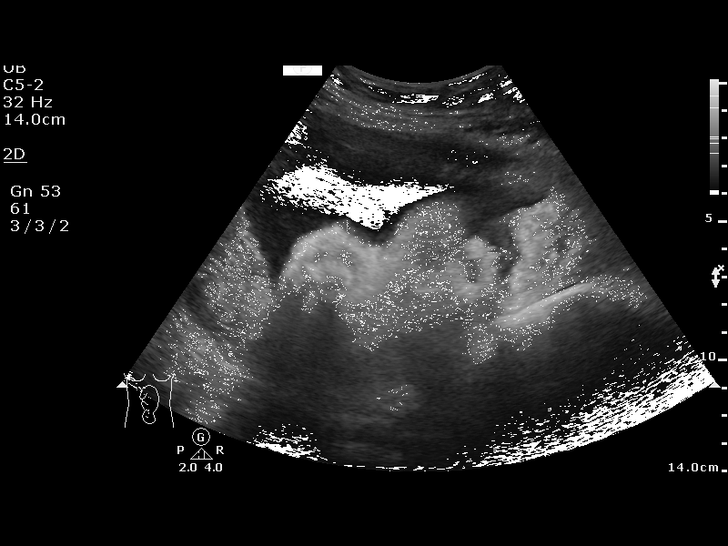
[im 10/14]
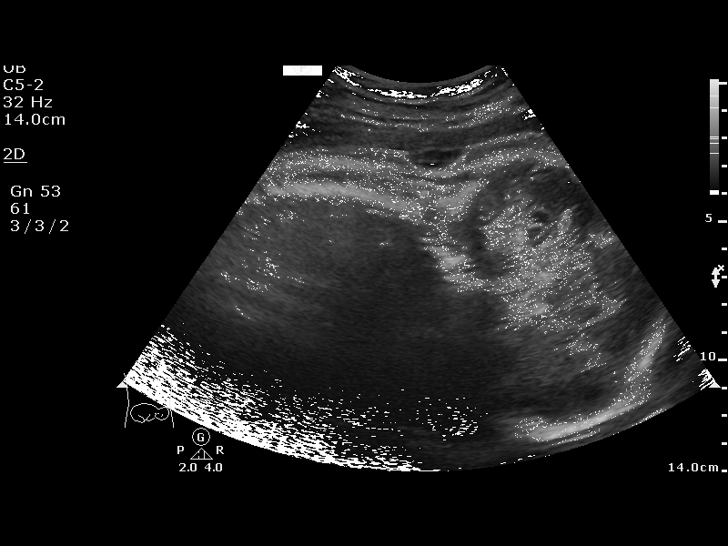
[im 11/14]
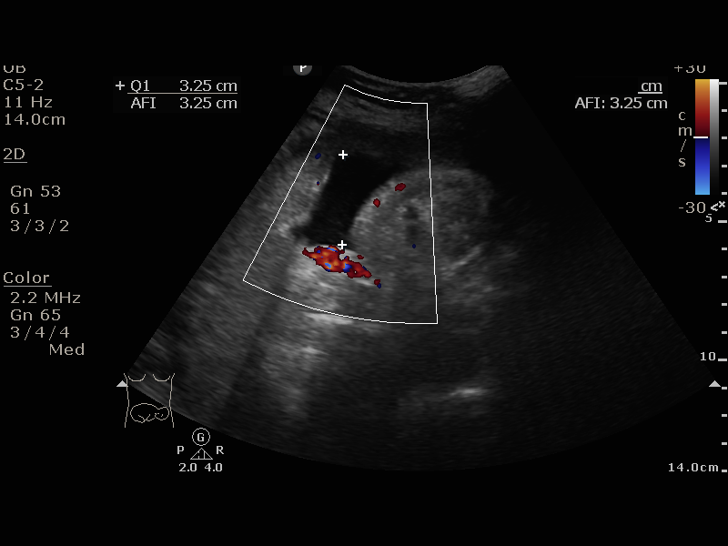
[im 12/14]
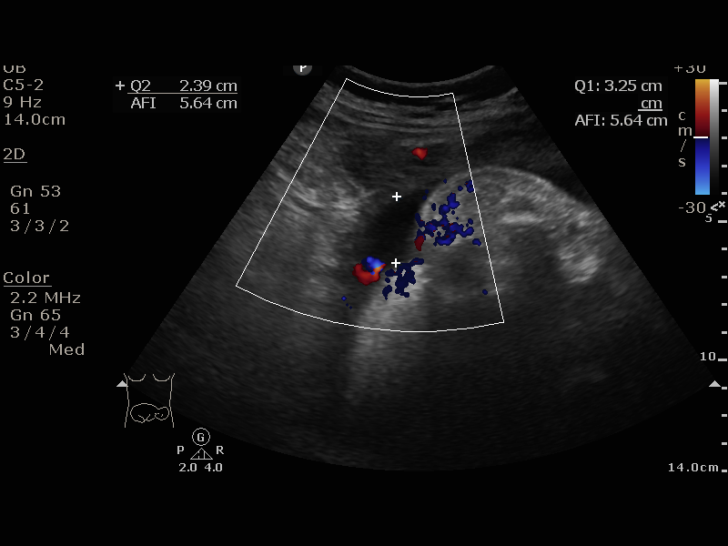
[im 13/14]
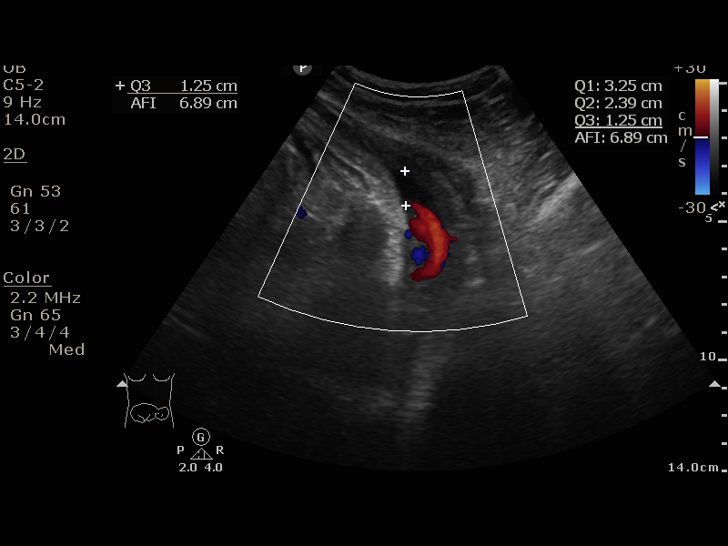
[im 14/14]
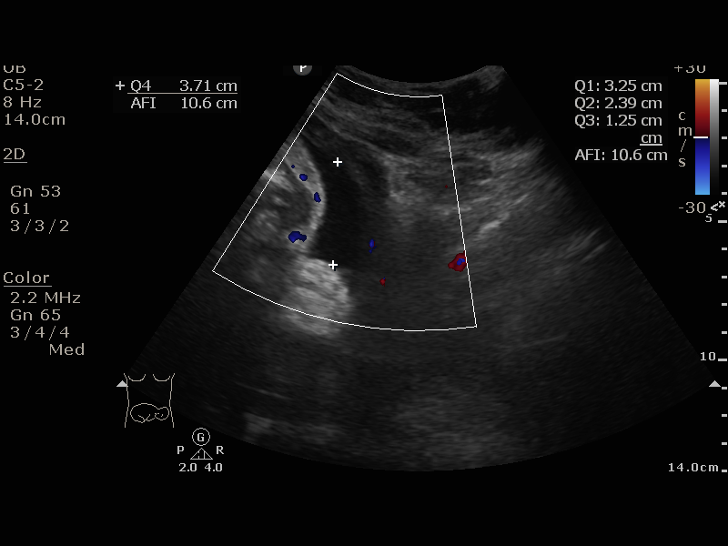

[13 of 14 positions shown; findings below may reference images not displayed]

[address]
                                                            Women[facility]
                                                            [REDACTED]

  1  US FETAL BPP W/NONSTRESS             76818.4      ABEDALAZIZ AMREEN
 ----------------------------------------------------------------------

 ----------------------------------------------------------------------
Service(s) Provided

 ----------------------------------------------------------------------
Indications

  35 weeks gestation of pregnancy
  Gestational diabetes in pregnancy, insulin
  controlled
 ----------------------------------------------------------------------
Vital Signs

                                                Height:        5'1"
Fetal Evaluation

 Num Of Fetuses:         1
 Preg. Location:         Intrauterine
 Cardiac Activity:       Observed
 Presentation:           Transverse, head to maternal left

 Amniotic Fluid
 AFI FV:      Within normal limits

 AFI Sum(cm)     %Tile       Largest Pocket(cm)
 10.6            26

 RUQ(cm)       RLQ(cm)       LUQ(cm)        LLQ(cm)

Biophysical Evaluation

 Amniotic F.V:   Pocket => 2 cm two         F. Tone:        Observed
                 planes
 F. Movement:    Observed                   N.S.T:          Reactive
 F. Breathing:   Observed                   Score:          [DATE]
OB History

 Gravidity:    5         Term:   3        Prem:   1
 Living:       4
Gestational Age

 LMP:           35w 6d        Date:  07/23/18                 EDD:   04/29/19
 Best:          35w 6d     Det. By:  LMP  (07/23/18)          EDD:   04/29/19
Impression

 Antenatal testing is reassuring with BPP [DATE].  Normal
 amniotic fluid volume.
Recommendations

 Continue weekly antenatal testing till delivery.
              Popo, Oamr

## 2021-07-31 ENCOUNTER — Other Ambulatory Visit: Payer: Self-pay | Admitting: Obstetrics

## 2021-07-31 DIAGNOSIS — Z139 Encounter for screening, unspecified: Secondary | ICD-10-CM

## 2022-12-16 NOTE — Therapy (Deleted)
OUTPATIENT PHYSICAL THERAPY SHOULDER EVALUATION   Patient Name: Michelle Cross MRN: [id number] DOB:[birth year], 42 y.o., female Today's Date: 12/16/2022  END OF SESSION:   Past Medical History:  Diagnosis Date   Anxiety    Depression    Gestational diabetes    glyburide w/1st, diet control w/2nd, 3rd & 4th   Preterm labor    delivery   Past Surgical History:  Procedure Laterality Date   APPENDECTOMY     Patient Active Problem List   Diagnosis Date Noted   Gestational diabetes mellitus (GDM) affecting pregnancy, antepartum 04/23/2019   Supervision of high risk pregnancy, antepartum 12/08/2018   History of preterm delivery, currently pregnant 12/08/2018   History of gestational diabetes in prior pregnancy, currently pregnant 12/08/2018   Language barrier 12/08/2018   Multigravida of advanced maternal age 80/07/2019   GDM, class A2 05/13/2012    PCP: ***  REFERRING PROVIDER: ***  REFERRING DIAG: ***  THERAPY DIAG:  No diagnosis found.  Rationale for Evaluation and Treatment: {HABREHAB:27488}  ONSET DATE: ***  SUBJECTIVE:                                                                                                                                                                                      SUBJECTIVE STATEMENT: ***  PERTINENT HISTORY: ***  PAIN:  Are you having pain? {OPRCPAIN:27236}  PRECAUTIONS: {Therapy precautions:24002}  WEIGHT BEARING RESTRICTIONS: {Yes ***/No:24003}  FALLS:  Has patient fallen in last 6 months? {fallsyesno:27318}  LIVING ENVIRONMENT: Lives with: {OP[facility] lives with:25569::"lives with their family"} Lives in: {Lives in:25570} Stairs: {opstairs:27293} Has following equipment at home: {Assistive devices:23999}  OCCUPATION: ***  PLOF: {PLOF:24004}  PATIENT GOALS:***  NEXT MD VISIT:   OBJECTIVE:   DIAGNOSTIC FINDINGS:  ***  PATIENT SURVEYS:  {rehab surveys:24030:a}  COGNITION: Overall cognitive status:  {cognition:24006}     SENSATION: {sensation:27233}  POSTURE: ***  UPPER EXTREMITY ROM:   {AROM/PROM:27142} ROM Right eval Left eval  Shoulder flexion    Shoulder extension    Shoulder abduction    Shoulder adduction    Shoulder internal rotation    Shoulder external rotation    Elbow flexion    Elbow extension    Wrist flexion    Wrist extension    Wrist ulnar deviation    Wrist radial deviation    Wrist pronation    Wrist supination    (Blank rows = not tested)  UPPER EXTREMITY MMT:  MMT Right eval Left eval  Shoulder flexion    Shoulder extension    Shoulder abduction    Shoulder adduction    Shoulder internal rotation    Shoulder external rotation  Middle trapezius    Lower trapezius    Elbow flexion    Elbow extension    Wrist flexion    Wrist extension    Wrist ulnar deviation    Wrist radial deviation    Wrist pronation    Wrist supination    Grip strength (lbs)    (Blank rows = not tested)  SHOULDER SPECIAL TESTS: Impingement tests: {shoulder impingement test:25231:a} SLAP lesions: {SLAP lesions:25232} Instability tests: {shoulder instability test:25233} Rotator cuff assessment: {rotator cuff assessment:25234} Biceps assessment: {biceps assessment:25235}  JOINT MOBILITY TESTING:  ***  PALPATION:  ***   TODAY'S TREATMENT:                                                                                                                                         DATE: ***   PATIENT EDUCATION: Education details: *** Person educated: {Person educated:25204} Education method: {Education Method:25205} Education comprehension: {Education Comprehension:25206}  HOME EXERCISE PROGRAM: ***  ASSESSMENT:  CLINICAL IMPRESSION: Patient is a *** y.o. *** who was seen today for physical therapy evaluation and treatment for ***.   OBJECTIVE IMPAIRMENTS: {opptimpairments:25111}.   ACTIVITY LIMITATIONS: {activitylimitations:27494}  PARTICIPATION  LIMITATIONS: {participationrestrictions:25113}  PERSONAL FACTORS: {Personal factors:25162} are also affecting patient's functional outcome.   REHAB POTENTIAL: {rehabpotential:25112}  CLINICAL DECISION MAKING: {clinical decision making:25114}  EVALUATION COMPLEXITY: {Evaluation complexity:25115}   GOALS: Goals reviewed with patient? {yes/no:20286}  SHORT TERM GOALS: Target date: ***  *** Baseline: Goal status: {GOALSTATUS:25110}  2.  *** Baseline:  Goal status: {GOALSTATUS:25110}  3.  *** Baseline:  Goal status: {GOALSTATUS:25110}  4.  *** Baseline:  Goal status: {GOALSTATUS:25110}  5.  *** Baseline:  Goal status: {GOALSTATUS:25110}  6.  *** Baseline:  Goal status: {GOALSTATUS:25110}  LONG TERM GOALS: Target date: ***  *** Baseline:  Goal status: {GOALSTATUS:25110}  2.  *** Baseline:  Goal status: {GOALSTATUS:25110}  3.  *** Baseline:  Goal status: {GOALSTATUS:25110}  4.  *** Baseline:  Goal status: {GOALSTATUS:25110}  5.  *** Baseline:  Goal status: {GOALSTATUS:25110}  6.  *** Baseline:  Goal status: {GOALSTATUS:25110}  PLAN:  PT FREQUENCY: {rehab frequency:25116}  PT DURATION: {rehab duration:25117}  PLANNED INTERVENTIONS: {rehab planned interventions:25118::"Therapeutic exercises","Therapeutic activity","Neuromuscular re-education","Balance training","Gait training","Patient/Family education","Self Care","Joint mobilization"}  PLAN FOR NEXT SESSION: ***   Degan Hanser, PT 12/16/2022, 8:09 AM

## 2022-12-17 ENCOUNTER — Ambulatory Visit: Payer: Medicaid Other | Admitting: Physical Therapy

## 2022-12-31 NOTE — Therapy (Signed)
OUTPATIENT PHYSICAL THERAPY CERVICAL EVALUATION   Patient Name: Michelle Cross MRN: [id number] DOB:1981-02-18, 42 y.o., female Today's Date: 01/01/2023  END OF SESSION:  PT End of Session - 01/01/23 1146     Visit Number 1    Number of Visits --   1-2x/week   Date for PT Re-Evaluation 02/26/23    Authorization Type [employer]    Authorization Time Period no auth first 27 visits    PT Start Time [id number]    PT Stop Time 1225    PT Time Calculation (min) 38 min    Activity Tolerance Patient tolerated treatment well;No increased pain;Patient limited by pain    Behavior During Therapy [facility] for tasks assessed/performed             Past Medical History:  Diagnosis Date   Anxiety    Depression    Gestational diabetes    glyburide w/1st, diet control w/2nd, 3rd & 4th   Preterm labor    delivery   Past Surgical History:  Procedure Laterality Date   APPENDECTOMY     Patient Active Problem List   Diagnosis Date Noted   Gestational diabetes mellitus (GDM) affecting pregnancy, antepartum 04/23/2019   Supervision of high risk pregnancy, antepartum 12/08/2018   History of preterm delivery, currently pregnant 12/08/2018   History of gestational diabetes in prior pregnancy, currently pregnant 12/08/2018   Language barrier 12/08/2018   Multigravida of advanced maternal age 34/07/2019   GDM, class A2 05/13/2012    PCP: Frederico Hamman, MD  REFERRING PROVIDER: Marchia Bond, MD  REFERRING DIAG: Right Shoulder pain and cervicalgia  THERAPY DIAG:  Chronic right shoulder pain  Cervicalgia  Stiffness of right shoulder, not elsewhere classified  Rationale for Evaluation and Treatment: Rehabilitation  ONSET DATE: last year, no MOI  SUBJECTIVE:                                                                                                                                                                                                         SUBJECTIVE STATEMENT: Appreciate  assistance of in person interpreter. Pt states about a year ago she began to develop pain in her R shoulder, as it has progressed she began to develop some neck pain + headaches when symptoms are worst, as well as referral down UE into middle finger. States she has received imaging (unable to view in EPIC) and was told it is a muscular issue. Denies any numbness or tingling, no BIL symptoms. Pain tends to be worse as day goes on and as she goes to  bed, especially when she does more activity. 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   Hand dominance: Right  PERTINENT HISTORY:  Anxiety/depression  PAIN:  Are you having pain: yes, 5/10 Location/description: R shoulder/neck, throbbing/aching Best-worst over past week: 2-9/10  Per eval -  - aggravating factors: cold water on hand, reaching overhead and behind back, upper body dressing - Easing factors: self massage, injections    PRECAUTIONS: None  WEIGHT BEARING RESTRICTIONS: No  FALLS:  Has patient fallen in last 6 months? 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 Unable to view in EPIC  PATIENT SURVEYS:  Quick Dash 70.45  (spanish)  COGNITION: Overall cognitive status: Within functional limits for tasks assessed with assistance of in person interpreter  SENSATION: WFL BIL UE   POSTURE: forward head posture, elevated R UT   PALPATION: Concordant tenderness R infraspinatus/teres, R elbow extensor bundle and triceps. Significant nontender tightness of R UT/LS and rhomboid/mid trap    CERVICAL ROM:   Active ROM A/PROM  (deg) eval  Flexion 100% s  Extension 100%  Right lateral flexion 100%  Left lateral flexion 100%  Right rotation 60 deg pain  Left rotation 52 deg pain   (Blank rows = not tested) (Key: WFL = within functional limits not formally assessed, * = concordant pain, s = stiffness/stretching sensation, NT = not tested)   UPPER EXTREMITY ROM:  A/PROM Right eval Left eval  Shoulder flexion 152 deg * 166 deg  Shoulder abduction 142 deg * 162 deg  Shoulder internal rotation    Shoulder external rotation    Elbow flexion    Elbow extension    Wrist flexion    Wrist extension     (Blank rows = not tested) (Key: WFL = within functional limits not formally assessed, * = concordant pain, s = stiffness/stretching sensation, NT = not tested)  Comments: more pain with lowering than raising on RUE  UPPER EXTREMITY MMT:  MMT Right eval Left eval  Shoulder flexion    Shoulder extension    Shoulder abduction    Shoulder extension    Shoulder internal rotation    Shoulder external rotation    Elbow flexion    Elbow extension    Grip strength 24 kg 28 kg   (Blank rows = not tested)  (Key: WFL = within functional limits not formally assessed, * = concordant pain, s = stiffness/stretching sensation, NT = not tested)  Comments: NT on eval given symptom irritability on eval   CERVICAL SPECIAL TESTS:  NT on eval given time constraints  FUNCTIONAL TESTS:  Limited overhead reach as above  TODAY'S TREATMENT:  [facility] Adult PT Treatment:                                                DATE: 01/01/23 Therapeutic Exercise: UT stretch x30sec BIL cues for form, breath control, HEP Scapular retraction x10 cues for form, reduced UT compensation, and HEP HEP education   PATIENT EDUCATION:  Education details: Pt education on PT impairments, prognosis, and POC. Informed consent.  Rationale for interventions, safe/appropriate HEP performance Person educated: Patient Education method: Explanation, Demonstration, Tactile cues, Verbal cues Education comprehension: verbalized understanding, returned demonstration, verbal cues required, tactile cues required, and needs further education    HOME EXERCISE PROGRAM: Access Code: [email or web address] URL: [email or web address] Date: 01/01/2023 Prepared by: Enis Slipper  Exercises - Seated Scapular Retraction  - 1 x daily - 7 x weekly - 2 sets - 10 reps - Seated Gentle Upper Trapezius Stretch  - 1 x daily - 7 x weekly - 1-3 sets - 3 reps - 30sec hold  ASSESSMENT:  CLINICAL IMPRESSION: Patient is a pleasant 42 y.o. woman who was seen today for physical therapy evaluation and treatment for neck + R shoulder pain. Pain began gradually about a year ago, occasionally gets headaches and UE referral when symptoms worse. On exam pt demos limitations in [facility] mobility (worse with lower UE) on R, concordant tightness/pain in R RC and elbow musculature, and significant nontender tightness in periscapular/cervical musculature. Symptoms fairly irritable on exam although denies increase in pain on departure, tolerates HEP well with cues/education for appropriate performance. No adverse events. Recommend skilled PT to address aforementioned deficits with aim of improving tolerance to homemaking/caregiving duties. Pt departs today's session in no acute distress, all voiced questions/concerns addressed appropriately from PT perspective.      OBJECTIVE IMPAIRMENTS: decreased activity tolerance, decreased endurance, decreased mobility, decreased ROM, decreased strength, hypomobility, impaired UE functional use, postural dysfunction, and pain.   ACTIVITY LIMITATIONS: dressing, reach over head, hygiene/grooming, and caring for others  PARTICIPATION LIMITATIONS: meal prep, cleaning, laundry, and occupation  PERSONAL FACTORS: Time since onset of  injury/illness/exacerbation are also affecting patient's functional outcome.   REHAB POTENTIAL: Good  CLINICAL DECISION MAKING: Stable/uncomplicated  EVALUATION COMPLEXITY: Low   GOALS: Goals reviewed with patient? No  SHORT TERM GOALS: Target date: 01/29/2023 Pt will demonstrate appropriate understanding and performance of initially prescribed HEP in order to facilitate improved independence with management of symptoms.  Baseline: HEP provided on eval Goal status: INITIAL   2. Pt will score less than or equal to 60 on QuickDASH in order to demonstrate improved perception of function due to symptoms.  Baseline: 70.45  Goal status: INITIAL    LONG TERM GOALS: Target date: 02/26/2023 Pt will score 50 or less on QuickDASH  in order to demonstrate improved perception of function due to symptoms. Baseline: 70.45 Goal status: INITIAL  2. Pt will demonstrate grossly symmetrical cervical rotation ROM in order to demonstrate improved environmental awareness. Baseline: see ROM chart above Goal status: INITIAL  3. Pt will demonstrate at least 4+/5 shoulder MMT for improved symmetry of UE strength and improved tolerance to functional movements.  Baseline: see MMT chart above Goal status: INITIAL   4. Pt will report ability to perform upper body dressing with less than 2 point increase on NPS in order to demonstrate improved tolerance to ADLs. Baseline: up to 9/10 pain with ADLs Goal status:  INITIAL   5. Pt will demonstrate grossly symmetrical grip strength in order to demonstrate improved functional strength/dexterity.  Baseline: see MMT chart above  Goal status: INITIAL   6. Pt will report at least 50% increase in overall pain in past week in order to facilitate improved tolerance to basic ADLs/mobility.   Baseline: 2-9/10 pain   Goal status: INITIAL     PLAN:  PT FREQUENCY: 1-2x/week  PT DURATION: 8 weeks (ending week of 02/27/23)  PLANNED INTERVENTIONS: Therapeutic exercises,  Therapeutic activity, Neuromuscular re-education, Balance training, Gait training, Patient/Family education, Self Care, Joint mobilization, DME instructions, Aquatic Therapy, Dry Needling, Electrical stimulation, Spinal mobilization, Cryotherapy, Moist heat, Taping, Manual therapy, and Re-evaluation  PLAN FOR NEXT SESSION: Look at [facility] MMT (especially RC), consider nerve tension testing. Review/update HEP PRN. Emphasis on cervical/periscapular activation/mobility as tolerated.    Leeroy Cha PT, DPT 01/01/2023 1:20 PM

## 2023-01-01 ENCOUNTER — Ambulatory Visit: Payer: Medicaid Other | Attending: Orthopedic Surgery | Admitting: Physical Therapy

## 2023-01-01 ENCOUNTER — Encounter: Payer: Self-pay | Admitting: Physical Therapy

## 2023-01-01 ENCOUNTER — Other Ambulatory Visit: Payer: Self-pay

## 2023-01-01 DIAGNOSIS — M542 Cervicalgia: Secondary | ICD-10-CM

## 2023-01-01 DIAGNOSIS — G8929 Other chronic pain: Secondary | ICD-10-CM | POA: Insufficient documentation

## 2023-01-01 DIAGNOSIS — M25511 Pain in right shoulder: Secondary | ICD-10-CM | POA: Insufficient documentation

## 2023-01-01 DIAGNOSIS — M25611 Stiffness of right shoulder, not elsewhere classified: Secondary | ICD-10-CM | POA: Diagnosis present

## 2023-01-07 ENCOUNTER — Encounter: Payer: Self-pay | Admitting: Physical Therapy

## 2023-01-07 ENCOUNTER — Ambulatory Visit: Payer: Medicaid Other | Admitting: Physical Therapy

## 2023-01-07 DIAGNOSIS — G8929 Other chronic pain: Secondary | ICD-10-CM

## 2023-01-07 DIAGNOSIS — M25511 Pain in right shoulder: Secondary | ICD-10-CM | POA: Diagnosis not present

## 2023-01-07 DIAGNOSIS — M542 Cervicalgia: Secondary | ICD-10-CM

## 2023-01-07 DIAGNOSIS — M25611 Stiffness of right shoulder, not elsewhere classified: Secondary | ICD-10-CM

## 2023-01-07 NOTE — Therapy (Signed)
OUTPATIENT PHYSICAL THERAPY TREATMENT NOTE   Patient Name: Michelle Cross MRN: [id number] DOB:1981/09/18, 42 y.o., female Today's Date: 01/07/2023  PCP: Kathreen Cosier, MD   REFERRING PROVIDER: Teryl Lucy, MD    END OF SESSION:   PT End of Session - 01/07/23 1148     Visit Number 2    Date for PT Re-Evaluation 02/26/23    Authorization Type [employer]    Authorization Time Period no auth first 27 visits    PT Start Time 1148    PT Stop Time 1228    PT Time Calculation (min) 40 min             Past Medical History:  Diagnosis Date   Anxiety    Depression    Gestational diabetes    glyburide w/1st, diet control w/2nd, 3rd & 4th   Preterm labor    delivery   Past Surgical History:  Procedure Laterality Date   APPENDECTOMY     Patient Active Problem List   Diagnosis Date Noted   Gestational diabetes mellitus (GDM) affecting pregnancy, antepartum 04/23/2019   Supervision of high risk pregnancy, antepartum 12/08/2018   History of preterm delivery, currently pregnant 12/08/2018   History of gestational diabetes in prior pregnancy, currently pregnant 12/08/2018   Language barrier 12/08/2018   Multigravida of advanced maternal age 80/07/2019   GDM, class A2 05/13/2012    REFERRING DIAG: Right Shoulder pain and cervicalgia  THERAPY DIAG:  Chronic right shoulder pain  Cervicalgia  Stiffness of right shoulder, not elsewhere classified  Rationale for Evaluation and Treatment Rehabilitation  PERTINENT HISTORY:  Anxiety/depression  PRECAUTIONS: none  SUBJECTIVE:                                                                                                                                                                                      SUBJECTIVE STATEMENT:  The last few days the neck and shoulder have been hurting way too much. I am doing the exercises and they hurt my neck and shoulder.    PAIN:  Are you having pain: yes,  6/10 Location/description: R shoulder/neck, throbbing/aching Best-worst over past week: 2-9/10  Per eval -  - aggravating factors: cold water on hand, reaching overhead and behind back, upper body dressing - Easing factors: self massage, injections       OBJECTIVE: (objective measures completed at initial evaluation unless otherwise dated)   DIAGNOSTIC FINDINGS:  Pt states she has had XR, no remarkable findings. Unable to view in EPIC   PATIENT SURVEYS:  Quick Dash 70.45  (spanish)   COGNITION: Overall cognitive status: Within functional limits for tasks assessed with assistance of in  person interpreter   SENSATION: WFL BIL UE    POSTURE: forward head posture, elevated R UT    PALPATION: Concordant tenderness R infraspinatus/teres, R elbow extensor bundle and triceps. Significant nontender tightness of R UT/LS and rhomboid/mid trap              CERVICAL ROM:    Active ROM A/PROM (deg) eval  Flexion 100% s  Extension 100%  Right lateral flexion 100%  Left lateral flexion 100%  Right rotation 60 deg pain  Left rotation 52 deg pain   (Blank rows = not tested) (Key: WFL = within functional limits not formally assessed, * = concordant pain, s = stiffness/stretching sensation, NT = not tested)    UPPER EXTREMITY ROM:   A/PROM Right eval Left eval Right 01/07/23  Shoulder flexion 152 deg * 166 deg   Shoulder abduction 142 deg * 162 deg   Shoulder internal rotation    WFL  Shoulder external rotation     WFL  Elbow flexion       Elbow extension       Wrist flexion       Wrist extension        (Blank rows = not tested) (Key: WFL = within functional limits not formally assessed, * = concordant pain, s = stiffness/stretching sensation, NT = not tested)  Comments: more pain with lowering than raising on RUE   UPPER EXTREMITY MMT:   MMT Right eval Left eval Right  01/07/23  Shoulder flexion     4- pain from hand to neck   Shoulder extension       Shoulder abduction      4- pain from hand to neck   Shoulder extension       Shoulder internal rotation     5 no pain  Shoulder external rotation     4 pain  Elbow flexion       Elbow extension       Grip strength 24 kg 28 kg    (Blank rows = not tested)  (Key: WFL = within functional limits not formally assessed, * = concordant pain, s = stiffness/stretching sensation, NT = not tested)  Comments: NT on eval given symptom irritability on eval    CERVICAL SPECIAL TESTS:  NT on eval given time constraints   FUNCTIONAL TESTS:  Limited overhead reach as above   TODAY'S TREATMENT:                                                                                                                             [facility]:                                                DATE: 01/07/23 Therapeutic Exercise: UT stretch  Scap squeeze Levator stretch Cervical rotation  AROM Median nerve rocking  Posterior capsule/rhomboid stretch - single arm cross body- 30 sec x 3  Seated chin tuck 5 sec x 10 Supine shoulder flexion AAROM -chest to overhead x 10   [facility] Adult PT Treatment:                                                DATE: 01/01/23 Therapeutic Exercise: UT stretch x30sec BIL cues for form, breath control, HEP Scapular retraction x10 cues for form, reduced UT compensation, and HEP HEP education     PATIENT EDUCATION:  Education details: Pt education on PT impairments, prognosis, and POC. Informed consent. Rationale for interventions, safe/appropriate HEP performance Person educated: Patient Education method: Explanation, Demonstration, Tactile cues, Verbal cues Education comprehension: verbalized understanding, returned demonstration, verbal cues required, tactile cues required, and needs further education     HOME EXERCISE PROGRAM: Access Code: [email or web address] URL: [email or web address] Date: 01/01/2023 Prepared by: Fransisco Hertz   Exercises - Seated Scapular Retraction  - 1 x daily - 7  x weekly - 2 sets - 10 reps - Seated Gentle Upper Trapezius Stretch  - 1 x daily - 7 x weekly - 1-3 sets - 3 reps - 30sec hold Added 01/07/23 - Standing Shoulder Posterior Capsule Stretch  - 1 x daily - 7 x weekly - 1 sets - 3 reps - 30 hold - Supine Shoulder Flexion with Dowel  - 1 x daily - 7 x weekly - 1 sets - 10 reps - 5 hold    ASSESSMENT:   CLINICAL IMPRESSION: Patient is a pleasant 42 y.o. woman who was seen today for physical therapy evaluation neck + R shoulder pain. Pain began gradually about a year ago, occasionally gets headaches and UE referral when symptoms worse. Pt reports increased pain over last few days in neck and shoulder. Shoulder most irritable today. Increased pain with shoulder MMT which shoots from middle finger to neck per patient. She reports compliance with HEP and increased pain with the exercises. Reviewed the exercises and she demonstrates independence. Worked on cervical and shoulder mobility with most relief reported after posterior capsule stretch and Supine Shoulder flexion AAROM with dowel. These were added to HEP. On departure she reported decreased pain.     EVAL: On exam pt demos limitations in [facility] mobility (worse with lower UE) on R, concordant tightness/pain in R RC and elbow musculature, and significant nontender tightness in periscapular/cervical musculature. Symptoms fairly irritable on exam although denies increase in pain on departure, tolerates HEP well with cues/education for appropriate performance. No adverse events. Recommend skilled PT to address aforementioned deficits with aim of improving tolerance to homemaking/caregiving duties. Pt departs today's session in no acute distress, all voiced questions/concerns addressed appropriately from PT perspective.       OBJECTIVE IMPAIRMENTS: decreased activity tolerance, decreased endurance, decreased mobility, decreased ROM, decreased strength, hypomobility, impaired UE functional use, postural  dysfunction, and pain.    ACTIVITY LIMITATIONS: dressing, reach over head, hygiene/grooming, and caring for others   PARTICIPATION LIMITATIONS: meal prep, cleaning, laundry, and occupation   PERSONAL FACTORS: Time since onset of injury/illness/exacerbation are also affecting patient's functional outcome.    REHAB POTENTIAL: Good   CLINICAL DECISION MAKING: Stable/uncomplicated   EVALUATION COMPLEXITY: Low     GOALS: Goals reviewed with patient? No   SHORT TERM GOALS: Target date: 01/29/2023 Pt will demonstrate appropriate  understanding and performance of initially prescribed HEP in order to facilitate improved independence with management of symptoms.  Baseline: HEP provided on eval Goal status: INITIAL    2. Pt will score less than or equal to 60 on QuickDASH in order to demonstrate improved perception of function due to symptoms.            Baseline: 70.45            Goal status: INITIAL     LONG TERM GOALS: Target date: 02/26/2023 Pt will score 50 or less on QuickDASH  in order to demonstrate improved perception of function due to symptoms. Baseline: 70.45 Goal status: INITIAL   2. Pt will demonstrate grossly symmetrical cervical rotation ROM in order to demonstrate improved environmental awareness. Baseline: see ROM chart above Goal status: INITIAL   3. Pt will demonstrate at least 4+/5 shoulder MMT for improved symmetry of UE strength and improved tolerance to functional movements.  Baseline: see MMT chart above Goal status: INITIAL    4. Pt will report ability to perform upper body dressing with less than 2 point increase on NPS in order to demonstrate improved tolerance to ADLs. Baseline: up to 9/10 pain with ADLs Goal status: INITIAL    5. Pt will demonstrate grossly symmetrical grip strength in order to demonstrate improved functional strength/dexterity.            Baseline: see MMT chart above            Goal status: INITIAL    6. Pt will report at least 50%  increase in overall pain in past week in order to facilitate improved tolerance to basic ADLs/mobility.             Baseline: 2-9/10 pain             Goal status: INITIAL       PLAN:   PT FREQUENCY: 1-2x/week   PT DURATION: 8 weeks (ending week of 02/27/23)   PLANNED INTERVENTIONS: Therapeutic exercises, Therapeutic activity, Neuromuscular re-education, Balance training, Gait training, Patient/Family education, Self Care, Joint mobilization, DME instructions, Aquatic Therapy, Dry Needling, Electrical stimulation, Spinal mobilization, Cryotherapy, Moist heat, Taping, Manual therapy, and Re-evaluation   PLAN FOR NEXT SESSION: Look at [facility] MMT (especially RC), consider nerve tension testing. Review/update HEP PRN. Emphasis on cervical/periscapular activation/mobility as tolerated.     Jannette Spanner, PTA 01/07/23 12:46 PM Phone: [phone] Fax: [phone]

## 2023-01-15 ENCOUNTER — Ambulatory Visit: Payer: Medicaid Other | Admitting: Physical Therapy

## 2023-01-21 NOTE — Therapy (Signed)
OUTPATIENT PHYSICAL THERAPY TREATMENT NOTE   Patient Name: Michelle Cross MRN: [id number] DOB:06/29/1981, 42 y.o., female Today's Date: 01/22/2023  PCP: Kathreen Cosier, MD   REFERRING PROVIDER: Teryl Lucy, MD    END OF SESSION:   PT End of Session - 01/22/23 0849     Visit Number 3    Date for PT Re-Evaluation 02/26/23    Authorization Type [employer]    Authorization Time Period no auth first 27 visits    PT Start Time 0849    PT Stop Time 0930    PT Time Calculation (min) 41 min    Activity Tolerance Patient tolerated treatment well    Behavior During Therapy WFL for tasks assessed/performed             Past Medical History:  Diagnosis Date   Anxiety    Depression    Gestational diabetes    glyburide w/1st, diet control w/2nd, 3rd & 4th   Preterm labor    delivery   Past Surgical History:  Procedure Laterality Date   APPENDECTOMY     Patient Active Problem List   Diagnosis Date Noted   Gestational diabetes mellitus (GDM) affecting pregnancy, antepartum 04/23/2019   Supervision of high risk pregnancy, antepartum 12/08/2018   History of preterm delivery, currently pregnant 12/08/2018   History of gestational diabetes in prior pregnancy, currently pregnant 12/08/2018   Language barrier 12/08/2018   Multigravida of advanced maternal age 54/07/2019   GDM, class A2 05/13/2012    REFERRING DIAG: Right Shoulder pain and cervicalgia  THERAPY DIAG:  Chronic right shoulder pain  Cervicalgia  Stiffness of right shoulder, not elsewhere classified  Rationale for Evaluation and Treatment Rehabilitation  PERTINENT HISTORY:  Anxiety/depression  PRECAUTIONS: none  SUBJECTIVE:                                                                                                                                                                                      SUBJECTIVE STATEMENT:  thorough interpreter, pt reports pain has been worse the last few days.  In  Right shld down to lower arm.  Pain up into the R neck  I feel like my hand swells too.  7 /10 but up to  and 8/10 at night.     PAIN:  Are you having pain: yes, 6/10 Location/description: R shoulder/neck, throbbing/aching Best-worst over past week: 2-9/10  Per eval -  - aggravating factors: cold water on hand, reaching overhead and behind back, upper body dressing - Easing factors: self massage, injections       OBJECTIVE: (objective measures completed at initial evaluation unless otherwise dated)   DIAGNOSTIC FINDINGS:  Pt states she has had XR, no remarkable findings. Unable to view in EPIC   PATIENT SURVEYS:  Quick Dash 70.45  (spanish)   COGNITION: Overall cognitive status: Within functional limits for tasks assessed with assistance of in person interpreter   SENSATION: WFL BIL UE    POSTURE: forward head posture, elevated R UT    PALPATION: Concordant tenderness R infraspinatus/teres, R elbow extensor bundle and triceps. Significant nontender tightness of R UT/LS and rhomboid/mid trap              CERVICAL ROM:    Active ROM A/PROM (deg) eval  Flexion 100% s  Extension 100%  Right lateral flexion 100%  Left lateral flexion 100%  Right rotation 60 deg pain  Left rotation 52 deg pain   (Blank rows = not tested) (Key: WFL = within functional limits not formally assessed, * = concordant pain, s = stiffness/stretching sensation, NT = not tested)    UPPER EXTREMITY ROM:   A/PROM Right eval Left eval Right 01/07/23  Shoulder flexion 152 deg * 166 deg   Shoulder abduction 142 deg * 162 deg   Shoulder internal rotation    WFL  Shoulder external rotation     WFL  Elbow flexion       Elbow extension       Wrist flexion       Wrist extension        (Blank rows = not tested) (Key: WFL = within functional limits not formally assessed, * = concordant pain, s = stiffness/stretching sensation, NT = not tested)  Comments: more pain with lowering than raising on RUE    UPPER EXTREMITY MMT:   MMT Right eval Left eval Right  01/07/23  Shoulder flexion     4- pain from hand to neck   Shoulder extension       Shoulder abduction     4- pain from hand to neck   Shoulder extension       Shoulder internal rotation     5 no pain  Shoulder external rotation     4 pain  Elbow flexion       Elbow extension       Grip strength 24 kg 28 kg    (Blank rows = not tested)  (Key: WFL = within functional limits not formally assessed, * = concordant pain, s = stiffness/stretching sensation, NT = not tested)  Comments: NT on eval given symptom irritability on eval    CERVICAL SPECIAL TESTS:  NT on eval given time constraints   FUNCTIONAL TESTS:  Limited overhead reach as above   TODAY'S TREATMENT:     [facility] Adult PT Treatment:                                                DATE: 01-22-23 TPDN? Therapeutic Exercise: UT stretch  on R 2 x 30 sec Levator stretch R 2 x 30 sec Scap squeeze x 15 Median nerve flossing  ER bil with RTB 3 x 10 Star pattern 3 x 10  Posterior capsule/rhomboid stretch - single arm cross body- 30 sec x 3  Seated chin tuck 5 sec x 10 Supine shoulder flexion 1 x 7  but increased pain.  Told to not do exercise at home and do the other TB exercises Manual Therapy: Trigger Point Dry Needling Treatment:  through interpreter and given Spanish handout Pre-treatment instruction: Patient instructed on dry needling rationale, procedures, and possible side effects including pain during treatment (achy,cramping feeling), bruising, drop of blood, lightheadedness, nausea, sweating. Patient Consent Given: Yes Education handout provided: Yes Muscles treated: R UT, R LS R infraspinatus  Needle size and number: .30x75mm x 3 Electrical stimulation performed: No Parameters: N/A Treatment response/outcome: Twitch response elicited and Palpable decrease in muscle tension Post-treatment instructions: Patient instructed to expect possible mild to moderate muscle  soreness later today and/or tomorrow. Patient instructed in methods to reduce muscle soreness and to continue prescribed HEP. If patient was dry needled over the lung field, patient was instructed on signs and symptoms of pneumothorax and, however unlikely, to see immediate medical attention should they occur. Patient was also educated on signs and symptoms of infection and to seek medical attention should they occur. Patient verbalized understanding of these instructions and education.   Modalities: Moist hot pack concurrent with exercise cervical                                                                                                                        [facility] Adult PT Treatment:                                                DATE: 01/07/23 Therapeutic Exercise: UT stretch  Scap squeeze Levator stretch Cervical rotation AROM Median nerve rocking  Posterior capsule/rhomboid stretch - single arm cross body- 30 sec x 3  Seated chin tuck 5 sec x 10 Supine shoulder flexion AAROM -chest to overhead x 10   [facility] Adult PT Treatment:                                                DATE: 01/01/23 Therapeutic Exercise: UT stretch x30sec BIL cues for form, breath control, HEP Scapular retraction x10 cues for form, reduced UT compensation, and HEP HEP education     PATIENT EDUCATION:  Education details: Pt education on PT impairments, prognosis, and POC. Informed consent. Rationale for interventions, safe/appropriate HEP performance Person educated: Patient Education method: Explanation, Demonstration, Tactile cues, Verbal cues Education comprehension: verbalized understanding, returned demonstration, verbal cues required, tactile cues required, and needs further education     HOME EXERCISE PROGRAM: Access Code: [email or web address] URL: [email or web address] Date: 01/22/2023 Prepared by: Garen Lah  Exercises - Seated Scapular Retraction  - 1 x daily - 7 x weekly - 2 sets - 10  reps - Seated Gentle Upper Trapezius Stretch  - 1 x daily - 7 x weekly - 1-3 sets - 3 reps - 30sec hold - Standing Shoulder Posterior Capsule Stretch  - 1 x daily - 7 x weekly - 1 sets -  3 reps - 30 hold - Supine Shoulder Flexion with Dowel  - 1 x daily - 7 x weekly - 1 sets - 10 reps - 5 hold - Standing Shoulder External Rotation with Resistance  - 1 x daily - 7 x weekly - 3 sets - 10 reps - Standing Shoulder Horizontal Abduction with Resistance  - 1 x daily - 7 x weekly - 3 sets - 10 reps - Standing Shoulder Diagonal Horizontal Abduction 60/120 Degrees with Resistance  - 1 x daily - 7 x weekly - 3 sets - 10 reps    ASSESSMENT:   CLINICAL IMPRESSION: Pt returns today with 710 pain today in R shld and R neck.  Pt with interpreter and was educated on TPDN and given [place] handout. Pt consented to TPDN and was closely monitored throughout session.  Pt with twitch responses and good result from TPDN.  Pt also with HEP updated. Pt with supine AROM flexion on R with increased tingling in hands so asked to not do that exercise and concentrate on RTB strengthening before next visit.  Pt reports 5/10 pain at end of session.   EVAL-Patient is a pleasant 42 y.o. woman who was seen today for physical therapy evaluation neck + R shoulder pain. Pain began gradually about a year ago, occasionally gets headaches and UE referral when symptoms worse. Pt reports increased pain over last few days in neck and shoulder. Shoulder most irritable today. Increased pain with shoulder MMT which shoots from middle finger to neck per patient. She reports compliance with HEP and increased pain with the exercises. Reviewed the exercises and she demonstrates independence. Worked on cervical and shoulder mobility with most relief reported after posterior capsule stretch and Supine Shoulder flexion AAROM with dowel. These were added to HEP. On departure she reported decreased pain.     EVAL: On exam pt demos limitations in [facility]  mobility (worse with lower UE) on R, concordant tightness/pain in R RC and elbow musculature, and significant nontender tightness in periscapular/cervical musculature. Symptoms fairly irritable on exam although denies increase in pain on departure, tolerates HEP well with cues/education for appropriate performance. No adverse events. Recommend skilled PT to address aforementioned deficits with aim of improving tolerance to homemaking/caregiving duties. Pt departs today's session in no acute distress, all voiced questions/concerns addressed appropriately from PT perspective.       OBJECTIVE IMPAIRMENTS: decreased activity tolerance, decreased endurance, decreased mobility, decreased ROM, decreased strength, hypomobility, impaired UE functional use, postural dysfunction, and pain.    ACTIVITY LIMITATIONS: dressing, reach over head, hygiene/grooming, and caring for others   PARTICIPATION LIMITATIONS: meal prep, cleaning, laundry, and occupation   PERSONAL FACTORS: Time since onset of injury/illness/exacerbation are also affecting patient's functional outcome.    REHAB POTENTIAL: Good   CLINICAL DECISION MAKING: Stable/uncomplicated   EVALUATION COMPLEXITY: Low     GOALS: Goals reviewed with patient? No   SHORT TERM GOALS: Target date: 01/29/2023 Pt will demonstrate appropriate understanding and performance of initially prescribed HEP in order to facilitate improved independence with management of symptoms.  Baseline: HEP provided on eval Goal status: INITIAL    2. Pt will score less than or equal to 60 on QuickDASH in order to demonstrate improved perception of function due to symptoms.            Baseline: 70.45            Goal status: INITIAL     LONG TERM GOALS: Target date: 02/26/2023 Pt will score 50  or less on QuickDASH  in order to demonstrate improved perception of function due to symptoms. Baseline: 70.45 Goal status: INITIAL   2. Pt will demonstrate grossly symmetrical cervical  rotation ROM in order to demonstrate improved environmental awareness. Baseline: see ROM chart above Goal status: INITIAL   3. Pt will demonstrate at least 4+/5 shoulder MMT for improved symmetry of UE strength and improved tolerance to functional movements.  Baseline: see MMT chart above Goal status: INITIAL    4. Pt will report ability to perform upper body dressing with less than 2 point increase on NPS in order to demonstrate improved tolerance to ADLs. Baseline: up to 9/10 pain with ADLs Goal status: INITIAL    5. Pt will demonstrate grossly symmetrical grip strength in order to demonstrate improved functional strength/dexterity.            Baseline: see MMT chart above            Goal status: INITIAL    6. Pt will report at least 50% increase in overall pain in past week in order to facilitate improved tolerance to basic ADLs/mobility.             Baseline: 2-9/10 pain             Goal status: INITIAL       PLAN:   PT FREQUENCY: 1-2x/week   PT DURATION: 8 weeks (ending week of 02/27/23)   PLANNED INTERVENTIONS: Therapeutic exercises, Therapeutic activity, Neuromuscular re-education, Balance training, Gait training, Patient/Family education, Self Care, Joint mobilization, DME instructions, Aquatic Therapy, Dry Needling, Electrical stimulation, Spinal mobilization, Cryotherapy, Moist heat, Taping, Manual therapy, and Re-evaluation   PLAN FOR NEXT SESSION: Look at [facility] MMT (especially RC), consider nerve tension testing. Review/update HEP PRN. Emphasis on cervical/periscapular activation/mobility as tolerated.     Garen Lah, PT, ATRIC Certified Exercise Expert for the Aging Adult  01/22/23 9:34 AM Phone: [phone] Fax: [phone]

## 2023-01-22 ENCOUNTER — Encounter: Payer: Self-pay | Admitting: Physical Therapy

## 2023-01-22 ENCOUNTER — Ambulatory Visit: Payer: Medicaid Other | Admitting: Physical Therapy

## 2023-01-22 DIAGNOSIS — M25611 Stiffness of right shoulder, not elsewhere classified: Secondary | ICD-10-CM

## 2023-01-22 DIAGNOSIS — M25511 Pain in right shoulder: Secondary | ICD-10-CM | POA: Diagnosis not present

## 2023-01-22 DIAGNOSIS — M542 Cervicalgia: Secondary | ICD-10-CM

## 2023-01-22 DIAGNOSIS — G8929 Other chronic pain: Secondary | ICD-10-CM

## 2023-01-22 NOTE — Therapy (Signed)
OUTPATIENT PHYSICAL THERAPY TREATMENT NOTE   Patient Name: Michelle Cross MRN: [id number] DOB:07-Oct-1980, 42 y.o., female Today's Date: 01/23/2023  PCP: Kathreen Cosier, MD   REFERRING PROVIDER: Teryl Lucy, MD    END OF SESSION:   PT End of Session - 01/23/23 0848     Visit Number 4    Number of Visits 17    Date for PT Re-Evaluation 02/26/23    Authorization Type [facility]    Authorization Time Period no auth first 27 visits    PT Start Time 0849    PT Stop Time 0927    PT Time Calculation (min) 38 min    Activity Tolerance Patient tolerated treatment well;No increased pain    Behavior During Therapy WFL for tasks assessed/performed              Past Medical History:  Diagnosis Date   Anxiety    Depression    Gestational diabetes    glyburide w/1st, diet control w/2nd, 3rd & 4th   Preterm labor    delivery   Past Surgical History:  Procedure Laterality Date   APPENDECTOMY     Patient Active Problem List   Diagnosis Date Noted   Gestational diabetes mellitus (GDM) affecting pregnancy, antepartum 04/23/2019   Supervision of high risk pregnancy, antepartum 12/08/2018   History of preterm delivery, currently pregnant 12/08/2018   History of gestational diabetes in prior pregnancy, currently pregnant 12/08/2018   Language barrier 12/08/2018   Multigravida of advanced maternal age 88/07/2019   GDM, class A2 05/13/2012    REFERRING DIAG: Right Shoulder pain and cervicalgia  THERAPY DIAG:  Chronic right shoulder pain  Cervicalgia  Stiffness of right shoulder, not elsewhere classified  Rationale for Evaluation and Treatment Rehabilitation  PERTINENT HISTORY:  Anxiety/depression  PRECAUTIONS: none  SUBJECTIVE:                                                                                                                                                                                      SUBJECTIVE STATEMENT:  Pt states her shoulder is feeling  a bit better today, felt more pain last night but feels the exercises and needling was very helpful.  Appreciate assistance of in person interpreter  PAIN:  Are you having pain: yes, 2/10 Location/description: R shoulder/neck, throbbing/aching Best-worst over past week: 2-9/10  Per eval -  - aggravating factors: cold water on hand, reaching overhead and behind back, upper body dressing - Easing factors: self massage, injections       OBJECTIVE: (objective measures completed at initial evaluation unless otherwise dated)   DIAGNOSTIC FINDINGS:  Pt states she has had XR, no remarkable  findings. Unable to view in EPIC   PATIENT SURVEYS:  Quick Dash 70.45  (spanish)   COGNITION: Overall cognitive status: Within functional limits for tasks assessed with assistance of in person interpreter   SENSATION: WFL BIL UE    POSTURE: forward head posture, elevated R UT    PALPATION: Concordant tenderness R infraspinatus/teres, R elbow extensor bundle and triceps. Significant nontender tightness of R UT/LS and rhomboid/mid trap              CERVICAL ROM:    Active ROM A/PROM (deg) eval  Flexion 100% s  Extension 100%  Right lateral flexion 100%  Left lateral flexion 100%  Right rotation 60 deg pain  Left rotation 52 deg pain   (Blank rows = not tested) (Key: WFL = within functional limits not formally assessed, * = concordant pain, s = stiffness/stretching sensation, NT = not tested)    UPPER EXTREMITY ROM:   A/PROM Right eval Left eval Right 01/07/23  Shoulder flexion 152 deg * 166 deg   Shoulder abduction 142 deg * 162 deg   Shoulder internal rotation    WFL  Shoulder external rotation     WFL  Elbow flexion       Elbow extension       Wrist flexion       Wrist extension        (Blank rows = not tested) (Key: WFL = within functional limits not formally assessed, * = concordant pain, s = stiffness/stretching sensation, NT = not tested)  Comments: more pain with lowering  than raising on RUE   UPPER EXTREMITY MMT:   MMT Right eval Left eval Right  01/07/23  Shoulder flexion     4- pain from hand to neck   Shoulder extension       Shoulder abduction     4- pain from hand to neck   Shoulder extension       Shoulder internal rotation     5 no pain  Shoulder external rotation     4 pain  Elbow flexion       Elbow extension       Grip strength 24 kg 28 kg    (Blank rows = not tested)  (Key: WFL = within functional limits not formally assessed, * = concordant pain, s = stiffness/stretching sensation, NT = not tested)  Comments: NT on eval given symptom irritability on eval    CERVICAL SPECIAL TESTS:  NT on eval given time constraints   FUNCTIONAL TESTS:  Limited overhead reach as above   TODAY'S TREATMENT:     [facility] Adult PT Treatment:                                                DATE: 01/23/23 Therapeutic Exercise: Radial nerve glides RUE x8 LS stretch 4x30 sec BIL cues for breath control  Swiss ball flexion rollout at table x12  B ER + scapular retraction 2x10 cues for posture  RTB around wrist BIL scaption  Radial nerve glides x10 HEP update to include radial nerve glides, significant time spent w/ education on appropriate performance, monitoring symptoms and pain modulation strategies   [facility] Adult PT Treatment:  DATE: 01-22-23 TPDN? Therapeutic Exercise: UT stretch  on R 2 x 30 sec Levator stretch R 2 x 30 sec Scap squeeze x 15 Median nerve flossing  ER bil with RTB 3 x 10 Star pattern 3 x 10  Posterior capsule/rhomboid stretch - single arm cross body- 30 sec x 3  Seated chin tuck 5 sec x 10 Supine shoulder flexion 1 x 7  but increased pain.  Told to not do exercise at home and do the other TB exercises Manual Therapy: Trigger Point Dry Needling Treatment: through interpreter and given Spanish handout Pre-treatment instruction: Patient instructed on dry needling rationale, procedures, and  possible side effects including pain during treatment (achy,cramping feeling), bruising, drop of blood, lightheadedness, nausea, sweating. Patient Consent Given: Yes Education handout provided: Yes Muscles treated: R UT, R LS R infraspinatus  Needle size and number: .30x61mm x 3 Electrical stimulation performed: No Parameters: N/A Treatment response/outcome: Twitch response elicited and Palpable decrease in muscle tension Post-treatment instructions: Patient instructed to expect possible mild to moderate muscle soreness later today and/or tomorrow. Patient instructed in methods to reduce muscle soreness and to continue prescribed HEP. If patient was dry needled over the lung field, patient was instructed on signs and symptoms of pneumothorax and, however unlikely, to see immediate medical attention should they occur. Patient was also educated on signs and symptoms of infection and to seek medical attention should they occur. Patient verbalized understanding of these instructions and education.   Modalities: Moist hot pack concurrent with exercise cervical                                                                                                                        [facility] Adult PT Treatment:                                                DATE: 01/07/23 Therapeutic Exercise: UT stretch  Scap squeeze Levator stretch Cervical rotation AROM Median nerve rocking  Posterior capsule/rhomboid stretch - single arm cross body- 30 sec x 3  Seated chin tuck 5 sec x 10 Supine shoulder flexion AAROM -chest to overhead x 10   [facility] Adult PT Treatment:                                                DATE: 01/01/23 Therapeutic Exercise: UT stretch x30sec BIL cues for form, breath control, HEP Scapular retraction x10 cues for form, reduced UT compensation, and HEP HEP education     PATIENT EDUCATION:  Education details: rationale for interventions, relevant anatomy/physiology, HEP update Person  educated: Patient Education method: Explanation, Demonstration, Tactile cues, Verbal cues, handout Education comprehension: verbalized understanding, returned demonstration, verbal cues required, tactile cues required, and  needs further education     HOME EXERCISE PROGRAM: Access Code: [email or web address] URL: [email or web address] Date: 01/23/2023 Prepared by: Fransisco Hertz  Exercises - Seated Scapular Retraction  - 1 x daily - 7 x weekly - 2 sets - 10 reps - Seated Gentle Upper Trapezius Stretch  - 1 x daily - 7 x weekly - 1-3 sets - 3 reps - 30sec hold - Standing Shoulder Posterior Capsule Stretch  - 1 x daily - 7 x weekly - 1 sets - 3 reps - 30 hold - Supine Shoulder Flexion with Dowel  - 1 x daily - 7 x weekly - 1 sets - 10 reps - 5 hold - Standing Shoulder External Rotation with Resistance  - 1 x daily - 7 x weekly - 3 sets - 10 reps - Standing Shoulder Horizontal Abduction with Resistance  - 1 x daily - 7 x weekly - 3 sets - 10 reps - Standing Shoulder Diagonal Horizontal Abduction 60/120 Degrees with Resistance  - 1 x daily - 7 x weekly - 3 sets - 10 reps - Radial Nerve Flossing  - 1 x daily - 7 x weekly - 1 sets - 10 reps    ASSESSMENT:   CLINICAL IMPRESSION: 01/23/2023 Pt arrives w/ report of 2/10 pain, states needling and exercises were very helpful yesterday. Pt describes the tingling she has been having in radial nerve distribution, positive for radial nerve testing today and reports resolution of symptoms w/ glides. Pt tolerates remainder of activity well without pain until progression to banded scaption for inc RC activation, which elicits radial nerve symptoms. These symptoms resolve with additional radial nerve glides, subsequently added to HEP and provided extensive education on appropriate usage, rationale, and symptom modification strategies. No adverse events, pt reports 0/10 pain on departure. Recommend continuing along current POC in order to address relevant deficits  and improve functional tolerance. Pt departs today's session in no acute distress, all voiced questions/concerns addressed appropriately from PT perspective.        OBJECTIVE IMPAIRMENTS: decreased activity tolerance, decreased endurance, decreased mobility, decreased ROM, decreased strength, hypomobility, impaired UE functional use, postural dysfunction, and pain.    ACTIVITY LIMITATIONS: dressing, reach over head, hygiene/grooming, and caring for others   PARTICIPATION LIMITATIONS: meal prep, cleaning, laundry, and occupation   PERSONAL FACTORS: Time since onset of injury/illness/exacerbation are also affecting patient's functional outcome.    REHAB POTENTIAL: Good   CLINICAL DECISION MAKING: Stable/uncomplicated   EVALUATION COMPLEXITY: Low     GOALS: Goals reviewed with patient? No   SHORT TERM GOALS: Target date: 01/29/2023 Pt will demonstrate appropriate understanding and performance of initially prescribed HEP in order to facilitate improved independence with management of symptoms.  Baseline: HEP provided on eval Goal status: INITIAL    2. Pt will score less than or equal to 60 on QuickDASH in order to demonstrate improved perception of function due to symptoms.            Baseline: 70.45            Goal status: INITIAL     LONG TERM GOALS: Target date: 02/26/2023 Pt will score 50 or less on QuickDASH  in order to demonstrate improved perception of function due to symptoms. Baseline: 70.45 Goal status: INITIAL   2. Pt will demonstrate grossly symmetrical cervical rotation ROM in order to demonstrate improved environmental awareness. Baseline: see ROM chart above Goal status: INITIAL   3. Pt will demonstrate at least 4+/5 shoulder MMT for improved symmetry of  UE strength and improved tolerance to functional movements.  Baseline: see MMT chart above Goal status: INITIAL    4. Pt will report ability to perform upper body dressing with less than 2 point increase on NPS in  order to demonstrate improved tolerance to ADLs. Baseline: up to 9/10 pain with ADLs Goal status: INITIAL    5. Pt will demonstrate grossly symmetrical grip strength in order to demonstrate improved functional strength/dexterity.            Baseline: see MMT chart above            Goal status: INITIAL    6. Pt will report at least 50% increase in overall pain in past week in order to facilitate improved tolerance to basic ADLs/mobility.             Baseline: 2-9/10 pain             Goal status: INITIAL       PLAN:   PT FREQUENCY: 1-2x/week   PT DURATION: 8 weeks (ending week of 02/27/23)   PLANNED INTERVENTIONS: Therapeutic exercises, Therapeutic activity, Neuromuscular re-education, Balance training, Gait training, Patient/Family education, Self Care, Joint mobilization, DME instructions, Aquatic Therapy, Dry Needling, Electrical stimulation, Spinal mobilization, Cryotherapy, Moist heat, Taping, Manual therapy, and Re-evaluation   PLAN FOR NEXT SESSION: radial nerve mobilization PRN for symptom modification. GH/periscapular strengthening.     Ashley Murrain PT, DPT 01/23/2023 11:48 AM

## 2023-01-22 NOTE — Patient Instructions (Addendum)
  Access Code: [email or web address] URL: [email or web address] Date: 01/22/2023 Prepared by: Garen Lah  Exercises - Seated Scapular Retraction  - 1 x daily - 7 x weekly - 2 sets - 10 reps - Seated Gentle Upper Trapezius Stretch  - 1 x daily - 7 x weekly - 1-3 sets - 3 reps - 30sec hold - Standing Shoulder Posterior Capsule Stretch  - 1 x daily - 7 x weekly - 1 sets - 3 reps - 30 hold - Supine Shoulder Flexion with Dowel  - 1 x daily - 7 x weekly - 1 sets - 10 reps - 5 hold - Standing Shoulder External Rotation with Resistance  - 1 x daily - 7 x weekly - 3 sets - 10 reps - Standing Shoulder Horizontal Abduction with Resistance  - 1 x daily - 7 x weekly - 3 sets - 10 reps - Standing Shoulder Diagonal Horizontal Abduction 60/120 Degrees with Resistance  - 1 x daily - 7 x weekly - 3 sets - 10 reps Garen Lah, PT, [facility] Certified Exercise Expert for the Aging Adult  01/22/23 8:55 AM Phone: [phone] Fax: [phone]

## 2023-01-23 ENCOUNTER — Encounter: Payer: Self-pay | Admitting: Physical Therapy

## 2023-01-23 ENCOUNTER — Ambulatory Visit: Payer: Medicaid Other | Admitting: Physical Therapy

## 2023-01-23 DIAGNOSIS — M25611 Stiffness of right shoulder, not elsewhere classified: Secondary | ICD-10-CM

## 2023-01-23 DIAGNOSIS — G8929 Other chronic pain: Secondary | ICD-10-CM

## 2023-01-23 DIAGNOSIS — M25511 Pain in right shoulder: Secondary | ICD-10-CM | POA: Diagnosis not present

## 2023-01-23 DIAGNOSIS — M542 Cervicalgia: Secondary | ICD-10-CM

## 2023-01-27 ENCOUNTER — Ambulatory Visit: Payer: Medicaid Other | Admitting: Physical Therapy

## 2023-01-27 ENCOUNTER — Encounter: Payer: Self-pay | Admitting: Physical Therapy

## 2023-01-27 DIAGNOSIS — M25611 Stiffness of right shoulder, not elsewhere classified: Secondary | ICD-10-CM

## 2023-01-27 DIAGNOSIS — M542 Cervicalgia: Secondary | ICD-10-CM

## 2023-01-27 DIAGNOSIS — G8929 Other chronic pain: Secondary | ICD-10-CM

## 2023-01-27 DIAGNOSIS — M25511 Pain in right shoulder: Secondary | ICD-10-CM | POA: Diagnosis not present

## 2023-01-27 NOTE — Therapy (Signed)
OUTPATIENT PHYSICAL THERAPY TREATMENT NOTE   Patient Name: Michelle Cross MRN: [id number] DOB:[birth year], 42 y.o., female Today's Date: 01/27/2023  PCP: Kathreen Cosier, MD   REFERRING PROVIDER: Teryl Lucy, MD    END OF SESSION:   PT End of Session - 01/27/23 0931     Visit Number 5    Number of Visits 17    Date for PT Re-Evaluation 02/26/23    Authorization Type MCD [facility]    Authorization Time Period no auth first 27 visits    PT Start Time 0931    PT Stop Time 1010    PT Time Calculation (min) 39 min    Activity Tolerance Patient tolerated treatment well;No increased pain    Behavior During Therapy WFL for tasks assessed/performed               Past Medical History:  Diagnosis Date   Anxiety    Depression    Gestational diabetes    glyburide w/1st, diet control w/2nd, 3rd & 4th   Preterm labor    delivery   Past Surgical History:  Procedure Laterality Date   APPENDECTOMY     Patient Active Problem List   Diagnosis Date Noted   Gestational diabetes mellitus (GDM) affecting pregnancy, antepartum 04/23/2019   Supervision of high risk pregnancy, antepartum 12/08/2018   History of preterm delivery, currently pregnant 12/08/2018   History of gestational diabetes in prior pregnancy, currently pregnant 12/08/2018   Language barrier 12/08/2018   Multigravida of advanced maternal age 51/07/2019   GDM, class A2 05/13/2012    REFERRING DIAG: Right Shoulder pain and cervicalgia  THERAPY DIAG:  Chronic right shoulder pain  Cervicalgia  Stiffness of right shoulder, not elsewhere classified  Rationale for Evaluation and Treatment Rehabilitation  PERTINENT HISTORY:  Anxiety/depression  PRECAUTIONS: none  SUBJECTIVE:                                                                                                                                                                                      SUBJECTIVE STATEMENT:  01/27/2023 Pt states she  continues to improve, feels nerve glides have helped quite a bit and needling was helpful. No resting pain since Friday, only while lifting arm overhead.  Appreciate assistance of in person interpreter  PAIN:  Are you having pain: yes, 0/10 Location/description: R shoulder/neck, throbbing/aching Best-worst over past week: 2-9/10  Per eval -  - aggravating factors: cold water on hand, reaching overhead and behind back, upper body dressing - Easing factors: self massage, injections       OBJECTIVE: (objective measures completed at initial evaluation unless otherwise dated)   DIAGNOSTIC FINDINGS:  Pt states  she has had XR, no remarkable findings. Unable to view in EPIC   PATIENT SURVEYS:  Quick Dash 70.45  (spanish)   COGNITION: Overall cognitive status: Within functional limits for tasks assessed with assistance of in person interpreter   SENSATION: WFL BIL UE    POSTURE: forward head posture, elevated R UT    PALPATION: Concordant tenderness R infraspinatus/teres, R elbow extensor bundle and triceps. Significant nontender tightness of R UT/LS and rhomboid/mid trap              CERVICAL ROM:    Active ROM A/PROM (deg) eval  Flexion 100% s  Extension 100%  Right lateral flexion 100%  Left lateral flexion 100%  Right rotation 60 deg pain  Left rotation 52 deg pain   (Blank rows = not tested) (Key: WFL = within functional limits not formally assessed, * = concordant pain, s = stiffness/stretching sensation, NT = not tested)    UPPER EXTREMITY ROM:   A/PROM Right eval Left eval Right 01/07/23  Shoulder flexion 152 deg * 166 deg   Shoulder abduction 142 deg * 162 deg   Shoulder internal rotation    WFL  Shoulder external rotation     WFL  Elbow flexion       Elbow extension       Wrist flexion       Wrist extension        (Blank rows = not tested) (Key: WFL = within functional limits not formally assessed, * = concordant pain, s = stiffness/stretching sensation, NT  = not tested)  Comments: more pain with lowering than raising on RUE   UPPER EXTREMITY MMT:   MMT Right eval Left eval Right  01/07/23  Shoulder flexion     4- pain from hand to neck   Shoulder extension       Shoulder abduction     4- pain from hand to neck   Shoulder extension       Shoulder internal rotation     5 no pain  Shoulder external rotation     4 pain  Elbow flexion       Elbow extension       Grip strength 24 kg 28 kg    (Blank rows = not tested)  (Key: WFL = within functional limits not formally assessed, * = concordant pain, s = stiffness/stretching sensation, NT = not tested)  Comments: NT on eval given symptom irritability on eval    CERVICAL SPECIAL TESTS:  NT on eval given time constraints   FUNCTIONAL TESTS:  Limited overhead reach as above   TODAY'S TREATMENT:     [facility] Adult PT Treatment:                                                DATE: 01/27/23 Therapeutic Exercise: Swiss ball flexion up wall x12 cues for lean and comfortable ROM  Swiss ball perturbations at wall 3x30sec cues for form, gentle pressure GTB ER + scapular retraction 3x8 cues for setup and posture  STS + fwd chest press 3# 3x5 cues for pacing and appropriate ROM  BIL scaption RTB around wrist 2x8 cues for posture and appropriate ROM  HEP review + education   [facility] Adult PT Treatment:  DATE: 01/23/23 Therapeutic Exercise: Radial nerve glides RUE x8 LS stretch 4x30 sec BIL cues for breath control  Swiss ball flexion rollout at table x12  B ER + scapular retraction 2x10 cues for posture  RTB around wrist BIL scaption  Radial nerve glides x10 HEP update to include radial nerve glides, significant time spent w/ education on appropriate performance, monitoring symptoms and pain modulation strategies   [facility] Adult PT Treatment:                                                DATE: 01-22-23 TPDN? Therapeutic Exercise: UT stretch  on R 2 x 30  sec Levator stretch R 2 x 30 sec Scap squeeze x 15 Median nerve flossing  ER bil with RTB 3 x 10 Star pattern 3 x 10  Posterior capsule/rhomboid stretch - single arm cross body- 30 sec x 3  Seated chin tuck 5 sec x 10 Supine shoulder flexion 1 x 7  but increased pain.  Told to not do exercise at home and do the other TB exercises Manual Therapy: Trigger Point Dry Needling Treatment: through interpreter and given Spanish handout Pre-treatment instruction: Patient instructed on dry needling rationale, procedures, and possible side effects including pain during treatment (achy,cramping feeling), bruising, drop of blood, lightheadedness, nausea, sweating. Patient Consent Given: Yes Education handout provided: Yes Muscles treated: R UT, R LS R infraspinatus  Needle size and number: .30x72mm x 3 Electrical stimulation performed: No Parameters: N/A Treatment response/outcome: Twitch response elicited and Palpable decrease in muscle tension Post-treatment instructions: Patient instructed to expect possible mild to moderate muscle soreness later today and/or tomorrow. Patient instructed in methods to reduce muscle soreness and to continue prescribed HEP. If patient was dry needled over the lung field, patient was instructed on signs and symptoms of pneumothorax and, however unlikely, to see immediate medical attention should they occur. Patient was also educated on signs and symptoms of infection and to seek medical attention should they occur. Patient verbalized understanding of these instructions and education.   Modalities: Moist hot pack concurrent with exercise cervical                                                                                                                        [facility] Adult PT Treatment:                                                DATE: 01/07/23 Therapeutic Exercise: UT stretch  Scap squeeze Levator stretch Cervical rotation AROM Median nerve rocking  Posterior  capsule/rhomboid stretch - single arm cross body- 30 sec x 3  Seated chin tuck 5 sec x 10 Supine shoulder flexion AAROM -chest to overhead x 10  [facility] Adult PT Treatment:                                                DATE: 01/01/23 Therapeutic Exercise: UT stretch x30sec BIL cues for form, breath control, HEP Scapular retraction x10 cues for form, reduced UT compensation, and HEP HEP education     PATIENT EDUCATION:  Education details: rationale for interventions, relevant anatomy/physiology, HEP update Person educated: Patient Education method: Explanation, Demonstration, Tactile cues, Verbal cues, handout Education comprehension: verbalized understanding, returned demonstration, verbal cues required, tactile cues required, and needs further education     HOME EXERCISE PROGRAM: Access Code: [email or web address] URL: [email or web address] Date: 01/23/2023 Prepared by: Fransisco Hertz  Exercises - Seated Scapular Retraction  - 1 x daily - 7 x weekly - 2 sets - 10 reps - Seated Gentle Upper Trapezius Stretch  - 1 x daily - 7 x weekly - 1-3 sets - 3 reps - 30sec hold - Standing Shoulder Posterior Capsule Stretch  - 1 x daily - 7 x weekly - 1 sets - 3 reps - 30 hold - Supine Shoulder Flexion with Dowel  - 1 x daily - 7 x weekly - 1 sets - 10 reps - 5 hold - Standing Shoulder External Rotation with Resistance  - 1 x daily - 7 x weekly - 3 sets - 10 reps - Standing Shoulder Horizontal Abduction with Resistance  - 1 x daily - 7 x weekly - 3 sets - 10 reps - Standing Shoulder Diagonal Horizontal Abduction 60/120 Degrees with Resistance  - 1 x daily - 7 x weekly - 3 sets - 10 reps - Radial Nerve Flossing  - 1 x daily - 7 x weekly - 1 sets - 10 reps    ASSESSMENT:   CLINICAL IMPRESSION: 01/27/2023 Pt arrives w/o pain, states that doing nerve glides has abolished her UE symptoms and now she is only having shoulder pain while actively reaching overhead. Today able to progress for increased  time with GH/periscapular strengthening/endurance towards overhead positioning. Describes muscular fatigue/pulling but no increases in resting pain. Reintroduced banded scaption today (provocative at last session) with significantly improved tolerance, no pain and denies any significant fatigue. HEP review at end of session. No adverse events, pt departs without pain. Recommend continuing along current POC in order to address relevant deficits and improve functional tolerance. Pt departs today's session in no acute distress, all voiced questions/concerns addressed appropriately from PT perspective.      OBJECTIVE IMPAIRMENTS: decreased activity tolerance, decreased endurance, decreased mobility, decreased ROM, decreased strength, hypomobility, impaired UE functional use, postural dysfunction, and pain.    ACTIVITY LIMITATIONS: dressing, reach over head, hygiene/grooming, and caring for others   PARTICIPATION LIMITATIONS: meal prep, cleaning, laundry, and occupation   PERSONAL FACTORS: Time since onset of injury/illness/exacerbation are also affecting patient's functional outcome.    REHAB POTENTIAL: Good   CLINICAL DECISION MAKING: Stable/uncomplicated   EVALUATION COMPLEXITY: Low     GOALS: Goals reviewed with patient? No   SHORT TERM GOALS: Target date: 01/29/2023 Pt will demonstrate appropriate understanding and performance of initially prescribed HEP in order to facilitate improved independence with management of symptoms.  Baseline: HEP provided on eval Goal status: INITIAL    2. Pt will score less than or equal to 60 on QuickDASH in order to demonstrate improved perception of function due to symptoms.  Baseline: 70.45            Goal status: INITIAL     LONG TERM GOALS: Target date: 02/26/2023 Pt will score 50 or less on QuickDASH  in order to demonstrate improved perception of function due to symptoms. Baseline: 70.45 Goal status: INITIAL   2. Pt will demonstrate  grossly symmetrical cervical rotation ROM in order to demonstrate improved environmental awareness. Baseline: see ROM chart above Goal status: INITIAL   3. Pt will demonstrate at least 4+/5 shoulder MMT for improved symmetry of UE strength and improved tolerance to functional movements.  Baseline: see MMT chart above Goal status: INITIAL    4. Pt will report ability to perform upper body dressing with less than 2 point increase on NPS in order to demonstrate improved tolerance to ADLs. Baseline: up to 9/10 pain with ADLs Goal status: INITIAL    5. Pt will demonstrate grossly symmetrical grip strength in order to demonstrate improved functional strength/dexterity.            Baseline: see MMT chart above            Goal status: INITIAL    6. Pt will report at least 50% increase in overall pain in past week in order to facilitate improved tolerance to basic ADLs/mobility.             Baseline: 2-9/10 pain             Goal status: INITIAL       PLAN:   PT FREQUENCY: 1-2x/week   PT DURATION: 8 weeks (ending week of 02/27/23)   PLANNED INTERVENTIONS: Therapeutic exercises, Therapeutic activity, Neuromuscular re-education, Balance training, Gait training, Patient/Family education, Self Care, Joint mobilization, DME instructions, Aquatic Therapy, Dry Needling, Electrical stimulation, Spinal mobilization, Cryotherapy, Moist heat, Taping, Manual therapy, and Re-evaluation   PLAN FOR NEXT SESSION: radial nerve mobilization PRN for symptom modification. GH/periscapular strengthening, gradually working into more long lever and overhead positions     Ashley Murrain PT, DPT 01/27/2023 10:33 AM

## 2023-01-29 ENCOUNTER — Ambulatory Visit: Payer: Medicaid Other | Attending: Orthopedic Surgery | Admitting: Physical Therapy

## 2023-01-29 ENCOUNTER — Encounter: Payer: Self-pay | Admitting: Physical Therapy

## 2023-01-29 DIAGNOSIS — M25611 Stiffness of right shoulder, not elsewhere classified: Secondary | ICD-10-CM | POA: Diagnosis present

## 2023-01-29 DIAGNOSIS — M25511 Pain in right shoulder: Secondary | ICD-10-CM | POA: Insufficient documentation

## 2023-01-29 DIAGNOSIS — M542 Cervicalgia: Secondary | ICD-10-CM | POA: Diagnosis present

## 2023-01-29 DIAGNOSIS — G8929 Other chronic pain: Secondary | ICD-10-CM | POA: Diagnosis present

## 2023-01-29 NOTE — Therapy (Signed)
OUTPATIENT PHYSICAL THERAPY TREATMENT NOTE   Patient Name: Michelle Cross MRN: [id number] DOB:1981/03/21, 42 y.o., female Today's Date: 01/29/2023  PCP: Kathreen Cosier, MD   REFERRING PROVIDER: Teryl Lucy, MD    END OF SESSION:   PT End of Session - 01/29/23 0854     Visit Number 6    Number of Visits 17    Date for PT Re-Evaluation 02/26/23    Authorization Type [employer]    Authorization Time Period no auth first 27 visits    PT Start Time 0852    PT Stop Time 0930    PT Time Calculation (min) 38 min               Past Medical History:  Diagnosis Date   Anxiety    Depression    Gestational diabetes    glyburide w/1st, diet control w/2nd, 3rd & 4th   Preterm labor    delivery   Past Surgical History:  Procedure Laterality Date   APPENDECTOMY     Patient Active Problem List   Diagnosis Date Noted   Gestational diabetes mellitus (GDM) affecting pregnancy, antepartum 04/23/2019   Supervision of high risk pregnancy, antepartum 12/08/2018   History of preterm delivery, currently pregnant 12/08/2018   History of gestational diabetes in prior pregnancy, currently pregnant 12/08/2018   Language barrier 12/08/2018   Multigravida of advanced maternal age 59/07/2019   GDM, class A2 05/13/2012    REFERRING DIAG: Right Shoulder pain and cervicalgia  THERAPY DIAG:  Chronic right shoulder pain  Cervicalgia  Stiffness of right shoulder, not elsewhere classified  Rationale for Evaluation and Treatment Rehabilitation  PERTINENT HISTORY:  Anxiety/depression  PRECAUTIONS: none  SUBJECTIVE:                                                                                                                                                                                      SUBJECTIVE STATEMENT:  01/29/2023 Pt reports she had some pain yesterday afternoon that improved with her exercises. It was 5/10. Pain increased with rotational movements at 50-60 degrees  abduction.  PAIN:  Are you having pain: yes, 0/10 Location/description: R shoulder/neck, throbbing/aching Best-worst over past week: 0-5/10  Per eval -  - aggravating factors: cold water on hand, reaching overhead and behind back, upper body dressing - Easing factors: self massage, injections       OBJECTIVE: (objective measures completed at initial evaluation unless otherwise dated)   DIAGNOSTIC FINDINGS:  Pt states she has had XR, no remarkable findings. Unable to view in EPIC   PATIENT SURVEYS:  Quick Dash 70.45  (spanish) 01/29/23: 63.6 % (spanish)   COGNITION: Overall cognitive  status: Within functional limits for tasks assessed with assistance of in person interpreter   SENSATION: WFL BIL UE    POSTURE: forward head posture, elevated R UT    PALPATION: Concordant tenderness R infraspinatus/teres, R elbow extensor bundle and triceps. Significant nontender tightness of R UT/LS and rhomboid/mid trap              CERVICAL ROM:    Active ROM A/PROM (deg) eval AROM 01/29/23  Flexion 100% s   Extension 100%   Right lateral flexion 100%   Left lateral flexion 100%   Right rotation 60 deg pain 65  Left rotation 52 deg pain 65   (Blank rows = not tested) (Key: WFL = within functional limits not formally assessed, * = concordant pain, s = stiffness/stretching sensation, NT = not tested)    UPPER EXTREMITY ROM:   A/PROM Right eval Left eval Right 01/07/23  Shoulder flexion 152 deg * 166 deg   Shoulder abduction 142 deg * 162 deg   Shoulder internal rotation    WFL  Shoulder external rotation     WFL  Elbow flexion       Elbow extension       Wrist flexion       Wrist extension        (Blank rows = not tested) (Key: WFL = within functional limits not formally assessed, * = concordant pain, s = stiffness/stretching sensation, NT = not tested)  Comments: more pain with lowering than raising on RUE   UPPER EXTREMITY MMT:   MMT Right eval Left eval Right  01/07/23    Shoulder flexion     4- pain from hand to neck    Shoulder extension        Shoulder abduction     4- pain from hand to neck    Shoulder extension        Shoulder internal rotation     5 no pain   Shoulder external rotation     4 pain 4+ pain  Elbow flexion        Elbow extension        Grip strength 24 kg 28 kg     (Blank rows = not tested)  (Key: WFL = within functional limits not formally assessed, * = concordant pain, s = stiffness/stretching sensation, NT = not tested)  Comments: NT on eval given symptom irritability on eval    CERVICAL SPECIAL TESTS:  NT on eval given time constraints   FUNCTIONAL TESTS:  Limited overhead reach as above   TODAY'S TREATMENT:     [facility] Adult PT Treatment:                                                DATE: 01/29/23 Therapeutic Exercise: Standing scaption 2# bil Standing abduction 2# bil x 5 -pain S/L shoulder abduction x 8, 2# x 8 S/L ER x 5, 2 # x 8 x 2  Supine chest press 2 # right  Supine 2# at 90 deg -circles 8 each way Supine shoulder flexion with dowel 2# chest press to overhead x 8 Supine ER/IR AAROM in 45 deg abduction Supine Right IR from 90deg Er 1# x 10 Right radial nerve glide     [facility] Adult PT Treatment:  DATE: 01/27/23 Therapeutic Exercise: Swiss ball flexion up wall x12 cues for lean and comfortable ROM  Swiss ball perturbations at wall 3x30sec cues for form, gentle pressure GTB ER + scapular retraction 3x8 cues for setup and posture  STS + fwd chest press 3# 3x5 cues for pacing and appropriate ROM  BIL scaption RTB around wrist 2x8 cues for posture and appropriate ROM  HEP review + education   [facility] Adult PT Treatment:                                                DATE: 01/23/23 Therapeutic Exercise: Radial nerve glides RUE x8 LS stretch 4x30 sec BIL cues for breath control  Swiss ball flexion rollout at table x12  B ER + scapular retraction 2x10 cues for posture  RTB  around wrist BIL scaption  Radial nerve glides x10 HEP update to include radial nerve glides, significant time spent w/ education on appropriate performance, monitoring symptoms and pain modulation strategies   [facility] Adult PT Treatment:                                                DATE: 01-22-23 TPDN? Therapeutic Exercise: UT stretch  on R 2 x 30 sec Levator stretch R 2 x 30 sec Scap squeeze x 15 Median nerve flossing  ER bil with RTB 3 x 10 Star pattern 3 x 10  Posterior capsule/rhomboid stretch - single arm cross body- 30 sec x 3  Seated chin tuck 5 sec x 10 Supine shoulder flexion 1 x 7  but increased pain.  Told to not do exercise at home and do the other TB exercises Manual Therapy: Trigger Point Dry Needling Treatment: through interpreter and given Spanish handout Pre-treatment instruction: Patient instructed on dry needling rationale, procedures, and possible side effects including pain during treatment (achy,cramping feeling), bruising, drop of blood, lightheadedness, nausea, sweating. Patient Consent Given: Yes Education handout provided: Yes Muscles treated: R UT, R LS R infraspinatus  Needle size and number: .30x27mm x 3 Electrical stimulation performed: No Parameters: N/A Treatment response/outcome: Twitch response elicited and Palpable decrease in muscle tension Post-treatment instructions: Patient instructed to expect possible mild to moderate muscle soreness later today and/or tomorrow. Patient instructed in methods to reduce muscle soreness and to continue prescribed HEP. If patient was dry needled over the lung field, patient was instructed on signs and symptoms of pneumothorax and, however unlikely, to see immediate medical attention should they occur. Patient was also educated on signs and symptoms of infection and to seek medical attention should they occur. Patient verbalized understanding of these instructions and education.   Modalities: Moist hot pack concurrent  with exercise cervical  [facility] Adult PT Treatment:                                                DATE: 01/07/23 Therapeutic Exercise: UT stretch  Scap squeeze Levator stretch Cervical rotation AROM Median nerve rocking  Posterior capsule/rhomboid stretch - single arm cross body- 30 sec x 3  Seated chin tuck 5 sec x 10 Supine shoulder flexion AAROM -chest to overhead x 10   [facility] Adult PT Treatment:                                                DATE: 01/01/23 Therapeutic Exercise: UT stretch x30sec BIL cues for form, breath control, HEP Scapular retraction x10 cues for form, reduced UT compensation, and HEP HEP education     PATIENT EDUCATION:  Education details: rationale for interventions, relevant anatomy/physiology, HEP update Person educated: Patient Education method: Explanation, Demonstration, Tactile cues, Verbal cues, handout Education comprehension: verbalized understanding, returned demonstration, verbal cues required, tactile cues required, and needs further education     HOME EXERCISE PROGRAM: Access Code: [email or web address] URL: [email or web address] Date: 01/23/2023 Prepared by: Fransisco Hertz  Exercises - Seated Scapular Retraction  - 1 x daily - 7 x weekly - 2 sets - 10 reps - Seated Gentle Upper Trapezius Stretch  - 1 x daily - 7 x weekly - 1-3 sets - 3 reps - 30sec hold - Standing Shoulder Posterior Capsule Stretch  - 1 x daily - 7 x weekly - 1 sets - 3 reps - 30 hold - Supine Shoulder Flexion with Dowel  - 1 x daily - 7 x weekly - 1 sets - 10 reps - 5 hold - Standing Shoulder External Rotation with Resistance  - 1 x daily - 7 x weekly - 3 sets - 10 reps - Standing Shoulder Horizontal Abduction with Resistance  - 1 x daily - 7 x weekly - 3 sets - 10 reps - Standing Shoulder Diagonal Horizontal Abduction 60/120 Degrees with Resistance  - 1 x  daily - 7 x weekly - 3 sets - 10 reps - Radial Nerve Flossing  - 1 x daily - 7 x weekly - 1 sets - 10 reps    ASSESSMENT:   CLINICAL IMPRESSION: 01/29/2023 Pt arrives w/o pain, states that doing nerve glides has abolished her UE symptoms and now she is only having shoulder pain while actively reaching overhead and twisting flexed arm. Today able to progress for increased time with GH/periscapular strengthening/endurance towards overhead positioning in supine. Abduction and ER strengthening tolerated well in side lying without pain.  Quick Dash score improved. I with HEP. STG#1 met. Progress made toward STG#2.      OBJECTIVE IMPAIRMENTS: decreased activity tolerance, decreased endurance, decreased mobility, decreased ROM, decreased strength, hypomobility, impaired UE functional use, postural dysfunction, and pain.    ACTIVITY LIMITATIONS: dressing, reach over head, hygiene/grooming, and caring for others   PARTICIPATION LIMITATIONS: meal prep, cleaning, laundry, and occupation   PERSONAL FACTORS: Time since onset of injury/illness/exacerbation are also affecting patient's functional outcome.    REHAB POTENTIAL: Good   CLINICAL DECISION MAKING: Stable/uncomplicated   EVALUATION COMPLEXITY: Low     GOALS: Goals reviewed with patient? No   SHORT TERM GOALS: Target  date: 01/29/2023 Pt will demonstrate appropriate understanding and performance of initially prescribed HEP in order to facilitate improved independence with management of symptoms.  Baseline: HEP provided on eval 01/29/23: independent and compliant Goal status: MET   2. Pt will score less than or equal to 60 on QuickDASH in order to demonstrate improved perception of function due to symptoms.            Baseline: 70.45  01/29/23: 63.6%             Goal status: ONGOING   LONG TERM GOALS: Target date: 02/26/2023 Pt will score 50 or less on QuickDASH  in order to demonstrate improved perception of function due to  symptoms. Baseline: 70.45 01/29/23: 63.6% Goal status: ONGOING   2. Pt will demonstrate grossly symmetrical cervical rotation ROM in order to demonstrate improved environmental awareness. Baseline: see ROM chart above Goal status: INITIAL   3. Pt will demonstrate at least 4+/5 shoulder MMT for improved symmetry of UE strength and improved tolerance to functional movements.  Baseline: see MMT chart above Goal status: INITIAL    4. Pt will report ability to perform upper body dressing with less than 2 point increase on NPS in order to demonstrate improved tolerance to ADLs. Baseline: up to 9/10 pain with ADLs Goal status: INITIAL    5. Pt will demonstrate grossly symmetrical grip strength in order to demonstrate improved functional strength/dexterity.            Baseline: see MMT chart above            Goal status: INITIAL    6. Pt will report at least 50% increase in overall pain in past week in order to facilitate improved tolerance to basic ADLs/mobility.             Baseline: 2-9/10 pain             Goal status: INITIAL       PLAN:   PT FREQUENCY: 1-2x/week   PT DURATION: 8 weeks (ending week of 02/27/23)   PLANNED INTERVENTIONS: Therapeutic exercises, Therapeutic activity, Neuromuscular re-education, Balance training, Gait training, Patient/Family education, Self Care, Joint mobilization, DME instructions, Aquatic Therapy, Dry Needling, Electrical stimulation, Spinal mobilization, Cryotherapy, Moist heat, Taping, Manual therapy, and Re-evaluation   PLAN FOR NEXT SESSION: radial nerve mobilization PRN for symptom modification. GH/periscapular strengthening, gradually working into more long lever and overhead positions     Jannette Spanner, [place] 01/29/23 12:38 PM Phone: [phone] Fax: [phone]

## 2023-02-03 ENCOUNTER — Ambulatory Visit: Payer: Medicaid Other | Admitting: Physical Therapy

## 2023-02-03 ENCOUNTER — Encounter: Payer: Self-pay | Admitting: Physical Therapy

## 2023-02-03 DIAGNOSIS — M25611 Stiffness of right shoulder, not elsewhere classified: Secondary | ICD-10-CM

## 2023-02-03 DIAGNOSIS — M25511 Pain in right shoulder: Secondary | ICD-10-CM | POA: Diagnosis not present

## 2023-02-03 DIAGNOSIS — G8929 Other chronic pain: Secondary | ICD-10-CM

## 2023-02-03 DIAGNOSIS — M542 Cervicalgia: Secondary | ICD-10-CM

## 2023-02-03 NOTE — Therapy (Signed)
OUTPATIENT PHYSICAL THERAPY TREATMENT NOTE   Patient Name: Michelle Cross MRN: [id number] DOB:06/25/1981, 42 y.o., female Today's Date: 02/03/2023  PCP: Kathreen Cosier, MD   REFERRING PROVIDER: Teryl Lucy, MD    END OF SESSION:   PT End of Session - 02/03/23 0933     Visit Number 7    Number of Visits 17    Date for PT Re-Evaluation 02/26/23    Authorization Type [employer]    Authorization Time Period no auth first 27 visits    PT Start Time 0930    PT Stop Time 1010    PT Time Calculation (min) 40 min               Past Medical History:  Diagnosis Date   Anxiety    Depression    Gestational diabetes    glyburide w/1st, diet control w/2nd, 3rd & 4th   Preterm labor    delivery   Past Surgical History:  Procedure Laterality Date   APPENDECTOMY     Patient Active Problem List   Diagnosis Date Noted   Gestational diabetes mellitus (GDM) affecting pregnancy, antepartum 04/23/2019   Supervision of high risk pregnancy, antepartum 12/08/2018   History of preterm delivery, currently pregnant 12/08/2018   History of gestational diabetes in prior pregnancy, currently pregnant 12/08/2018   Language barrier 12/08/2018   Multigravida of advanced maternal age 92/07/2019   GDM, class A2 05/13/2012    REFERRING DIAG: Right Shoulder pain and cervicalgia  THERAPY DIAG:  Chronic right shoulder pain  Cervicalgia  Stiffness of right shoulder, not elsewhere classified  Rationale for Evaluation and Treatment Rehabilitation  PERTINENT HISTORY:  Anxiety/depression  PRECAUTIONS: none  SUBJECTIVE:                                                                                                                                                                                      SUBJECTIVE STATEMENT:  02/03/2023 Pt reports she is doing well she did not have pain after last session.   PAIN:  Are you having pain: yes, 0/10 at rest, has a sensation when twisting arm  at 40-60 degrees of abduction Location/description: R shoulder/neck, throbbing/aching Best-worst over past week: 0-5/10  Per eval -  - aggravating factors: cold water on hand, reaching overhead and behind back, upper body dressing - Easing factors: self massage, injections       OBJECTIVE: (objective measures completed at initial evaluation unless otherwise dated)   DIAGNOSTIC FINDINGS:  Pt states she has had XR, no remarkable findings. Unable to view in EPIC   PATIENT SURVEYS:  Quick Dash 70.45  (spanish) 01/29/23: 63.6 % (spanish)  COGNITION: Overall cognitive status: Within functional limits for tasks assessed with assistance of in person interpreter   SENSATION: WFL BIL UE    POSTURE: forward head posture, elevated R UT    PALPATION: Concordant tenderness R infraspinatus/teres, R elbow extensor bundle and triceps. Significant nontender tightness of R UT/LS and rhomboid/mid trap              CERVICAL ROM:    Active ROM A/PROM (deg) eval AROM 01/29/23  Flexion 100% s   Extension 100%   Right lateral flexion 100%   Left lateral flexion 100%   Right rotation 60 deg pain 65  Left rotation 52 deg pain 65   (Blank rows = not tested) (Key: WFL = within functional limits not formally assessed, * = concordant pain, s = stiffness/stretching sensation, NT = not tested)    UPPER EXTREMITY ROM:   A/PROM Right eval Left eval Right 01/07/23 Right  02/03/23  Shoulder flexion 152 deg * 166 deg    Shoulder abduction 142 deg * 162 deg  165 pain with eccentric   Shoulder internal rotation    WFL   Shoulder external rotation     WFL   Elbow flexion        Elbow extension        Wrist flexion        Wrist extension         (Blank rows = not tested) (Key: WFL = within functional limits not formally assessed, * = concordant pain, s = stiffness/stretching sensation, NT = not tested)  Comments: more pain with lowering than raising on RUE   UPPER EXTREMITY MMT:   MMT Right eval  Left eval Right  01/07/23 Right 01/29/23  Shoulder flexion     4- pain from hand to neck    Shoulder extension        Shoulder abduction     4- pain from hand to neck    Shoulder extension        Shoulder internal rotation     5 no pain   Shoulder external rotation     4 pain 4+ pain  Elbow flexion        Elbow extension        Grip strength 24 kg 28 kg     (Blank rows = not tested)  (Key: WFL = within functional limits not formally assessed, * = concordant pain, s = stiffness/stretching sensation, NT = not tested)  Comments: NT on eval given symptom irritability on eval    CERVICAL SPECIAL TESTS:  NT on eval given time constraints   FUNCTIONAL TESTS:  Limited overhead reach as above   TODAY'S TREATMENT:     [facility] Adult PT Treatment:                                                DATE: 02/03/23 Therapeutic Exercise: Standing scaption 3# bilateral  Standing bicep curl to OH press 2# x 8, 3# x 8  Swiss ball flexion up wall x12 cues for lean and comfortable ROM  Green band ER + scap retract  Supine 3# at 90 deg -circles 10 each way S/L shoulder abduction 2# 1 x 10 , 3# 1 x 10 S/L shoulder ER 2# 1 x 10, 3# 1 x 10 Supine Right chest press 3# x 10 Supine right long  lever AROM shoulder flexion to 90 1# x 10, 2# x 10 Supine green band diagonals x 10  Standing yellow band IR  x 10  Standing yellow band chest press x 10    [facility] Adult PT Treatment:                                                DATE: 01/29/23 Therapeutic Exercise: Standing scaption 2# bil Standing abduction 2# bil x 5 -pain S/L shoulder abduction x 8, 2# x 8 S/L ER x 5, 2 # x 8 x 2  Supine chest press 2 # right  Supine 2# at 90 deg -circles 8 each way Supine shoulder flexion with dowel 2# chest press to overhead x 8 Supine ER/IR AAROM in 45 deg abduction Supine Right IR from 90deg Er 1# x 10 Right radial nerve glide     [facility] Adult PT Treatment:                                                DATE:  01/27/23 Therapeutic Exercise: Swiss ball flexion up wall x12 cues for lean and comfortable ROM  Swiss ball perturbations at wall 3x30sec cues for form, gentle pressure GTB ER + scapular retraction 3x8 cues for setup and posture  STS + fwd chest press 3# 3x5 cues for pacing and appropriate ROM  BIL scaption RTB around wrist 2x8 cues for posture and appropriate ROM  HEP review + education   [facility] Adult PT Treatment:                                                DATE: 01/23/23 Therapeutic Exercise: Radial nerve glides RUE x8 LS stretch 4x30 sec BIL cues for breath control  Swiss ball flexion rollout at table x12  B ER + scapular retraction 2x10 cues for posture  RTB around wrist BIL scaption  Radial nerve glides x10 HEP update to include radial nerve glides, significant time spent w/ education on appropriate performance, monitoring symptoms and pain modulation strategies   [facility] Adult PT Treatment:                                                DATE: 01-22-23 TPDN? Therapeutic Exercise: UT stretch  on R 2 x 30 sec Levator stretch R 2 x 30 sec Scap squeeze x 15 Median nerve flossing  ER bil with RTB 3 x 10 Star pattern 3 x 10  Posterior capsule/rhomboid stretch - single arm cross body- 30 sec x 3  Seated chin tuck 5 sec x 10 Supine shoulder flexion 1 x 7  but increased pain.  Told to not do exercise at home and do the other TB exercises Manual Therapy: Trigger Point Dry Needling Treatment: through interpreter and given Spanish handout Pre-treatment instruction: Patient instructed on dry needling rationale, procedures, and possible side effects including pain during treatment (achy,cramping feeling), bruising, drop of blood, lightheadedness, nausea, sweating. Patient Consent Given: Yes  Education handout provided: Yes Muscles treated: R UT, R LS R infraspinatus  Needle size and number: .30x15mm x 3 Electrical stimulation performed: No Parameters: N/A Treatment response/outcome:  Twitch response elicited and Palpable decrease in muscle tension Post-treatment instructions: Patient instructed to expect possible mild to moderate muscle soreness later today and/or tomorrow. Patient instructed in methods to reduce muscle soreness and to continue prescribed HEP. If patient was dry needled over the lung field, patient was instructed on signs and symptoms of pneumothorax and, however unlikely, to see immediate medical attention should they occur. Patient was also educated on signs and symptoms of infection and to seek medical attention should they occur. Patient verbalized understanding of these instructions and education.   Modalities: Moist hot pack concurrent with exercise cervical                                                                                                                        [facility] Adult PT Treatment:                                                DATE: 01/07/23 Therapeutic Exercise: UT stretch  Scap squeeze Levator stretch Cervical rotation AROM Median nerve rocking  Posterior capsule/rhomboid stretch - single arm cross body- 30 sec x 3  Seated chin tuck 5 sec x 10 Supine shoulder flexion AAROM -chest to overhead x 10   [facility] Adult PT Treatment:                                                DATE: 01/01/23 Therapeutic Exercise: UT stretch x30sec BIL cues for form, breath control, HEP Scapular retraction x10 cues for form, reduced UT compensation, and HEP HEP education     PATIENT EDUCATION:  Education details: rationale for interventions, relevant anatomy/physiology, HEP update Person educated: Patient Education method: Explanation, Demonstration, Tactile cues, Verbal cues, handout Education comprehension: verbalized understanding, returned demonstration, verbal cues required, tactile cues required, and needs further education     HOME EXERCISE PROGRAM: Access Code: [email or web address] URL: [email or web address] Date: 01/23/2023 Prepared  by: Fransisco Hertz  Exercises - Seated Scapular Retraction  - 1 x daily - 7 x weekly - 2 sets - 10 reps - Seated Gentle Upper Trapezius Stretch  - 1 x daily - 7 x weekly - 1-3 sets - 3 reps - 30sec hold - Standing Shoulder Posterior Capsule Stretch  - 1 x daily - 7 x weekly - 1 sets - 3 reps - 30 hold - Supine Shoulder Flexion with Dowel  - 1 x daily - 7 x weekly - 1 sets - 10 reps - 5 hold - Standing Shoulder External Rotation with Resistance  - 1 x daily - 7 x  weekly - 3 sets - 10 reps - Standing Shoulder Horizontal Abduction with Resistance  - 1 x daily - 7 x weekly - 3 sets - 10 reps - Standing Shoulder Diagonal Horizontal Abduction 60/120 Degrees with Resistance  - 1 x daily - 7 x weekly - 3 sets - 10 reps - Radial Nerve Flossing  - 1 x daily - 7 x weekly - 1 sets - 10 reps    ASSESSMENT:   CLINICAL IMPRESSION: 02/03/2023 Pt arrives w/o pain, states that she has pain with donning and doffing blouses that require shoulder extension and IR combination. Her overhead reaching is improved. Able to progress with resisted OH press and long lever supine shoulder flexion and side lying abduction with resistance. Will continue to progress as tolerated.       OBJECTIVE IMPAIRMENTS: decreased activity tolerance, decreased endurance, decreased mobility, decreased ROM, decreased strength, hypomobility, impaired UE functional use, postural dysfunction, and pain.    ACTIVITY LIMITATIONS: dressing, reach over head, hygiene/grooming, and caring for others   PARTICIPATION LIMITATIONS: meal prep, cleaning, laundry, and occupation   PERSONAL FACTORS: Time since onset of injury/illness/exacerbation are also affecting patient's functional outcome.    REHAB POTENTIAL: Good   CLINICAL DECISION MAKING: Stable/uncomplicated   EVALUATION COMPLEXITY: Low     GOALS: Goals reviewed with patient? No   SHORT TERM GOALS: Target date: 01/29/2023 Pt will demonstrate appropriate understanding and performance of  initially prescribed HEP in order to facilitate improved independence with management of symptoms.  Baseline: HEP provided on eval 01/29/23: independent and compliant Goal status: MET   2. Pt will score less than or equal to 60 on QuickDASH in order to demonstrate improved perception of function due to symptoms.            Baseline: 70.45  01/29/23: 63.6%             Goal status: ONGOING   LONG TERM GOALS: Target date: 02/26/2023 Pt will score 50 or less on QuickDASH  in order to demonstrate improved perception of function due to symptoms. Baseline: 70.45 01/29/23: 63.6% Goal status: ONGOING   2. Pt will demonstrate grossly symmetrical cervical rotation ROM in order to demonstrate improved environmental awareness. Baseline: see ROM chart above Goal status: INITIAL   3. Pt will demonstrate at least 4+/5 shoulder MMT for improved symmetry of UE strength and improved tolerance to functional movements.  Baseline: see MMT chart above Goal status: INITIAL    4. Pt will report ability to perform upper body dressing with less than 2 point increase on NPS in order to demonstrate improved tolerance to ADLs. Baseline: up to 9/10 pain with ADLs 02/03/23: is still careful : has pain with taking off coat (shoulder ext and IR ) Goal status: ONGOING   5. Pt will demonstrate grossly symmetrical grip strength in order to demonstrate improved functional strength/dexterity.            Baseline: see MMT chart above            Goal status: INITIAL    6. Pt will report at least 50% increase in overall pain in past week in order to facilitate improved tolerance to basic ADLs/mobility.             Baseline: 2-9/10 pain             Goal status: INITIAL       PLAN:   PT FREQUENCY: 1-2x/week   PT DURATION: 8 weeks (ending week of 02/27/23)  PLANNED INTERVENTIONS: Therapeutic exercises, Therapeutic activity, Neuromuscular re-education, Balance training, Gait training, Patient/Family education, Self Care, Joint  mobilization, DME instructions, Aquatic Therapy, Dry Needling, Electrical stimulation, Spinal mobilization, Cryotherapy, Moist heat, Taping, Manual therapy, and Re-evaluation   PLAN FOR NEXT SESSION: radial nerve mobilization PRN for symptom modification. GH/periscapular strengthening, gradually working into more long lever and overhead positions     Jannette Spanner, PTA 02/03/23 1:07 PM Phone: [phone] Fax: [phone]

## 2023-02-05 ENCOUNTER — Encounter: Payer: Self-pay | Admitting: Physical Therapy

## 2023-02-05 ENCOUNTER — Ambulatory Visit: Payer: Medicaid Other | Admitting: Physical Therapy

## 2023-02-05 DIAGNOSIS — M25611 Stiffness of right shoulder, not elsewhere classified: Secondary | ICD-10-CM

## 2023-02-05 DIAGNOSIS — M542 Cervicalgia: Secondary | ICD-10-CM

## 2023-02-05 DIAGNOSIS — G8929 Other chronic pain: Secondary | ICD-10-CM

## 2023-02-05 DIAGNOSIS — M25511 Pain in right shoulder: Secondary | ICD-10-CM | POA: Diagnosis not present

## 2023-02-05 NOTE — Therapy (Signed)
OUTPATIENT PHYSICAL THERAPY TREATMENT NOTE   Patient Name: Michelle Cross MRN: [id number] DOB:05/07/1981, 42 y.o., female Today's Date: 02/05/2023  PCP: Kathreen Cosier, MD   REFERRING PROVIDER: Teryl Lucy, MD    END OF SESSION:   PT End of Session - 02/05/23 0846     Visit Number 8    Number of Visits 17    Date for PT Re-Evaluation 02/26/23    Authorization Type [employer]    Authorization Time Period no auth first 27 visits    PT Start Time 0845    PT Stop Time 0928    PT Time Calculation (min) 43 min               Past Medical History:  Diagnosis Date   Anxiety    Depression    Gestational diabetes    glyburide w/1st, diet control w/2nd, 3rd & 4th   Preterm labor    delivery   Past Surgical History:  Procedure Laterality Date   APPENDECTOMY     Patient Active Problem List   Diagnosis Date Noted   Gestational diabetes mellitus (GDM) affecting pregnancy, antepartum 04/23/2019   Supervision of high risk pregnancy, antepartum 12/08/2018   History of preterm delivery, currently pregnant 12/08/2018   History of gestational diabetes in prior pregnancy, currently pregnant 12/08/2018   Language barrier 12/08/2018   Multigravida of advanced maternal age 10/09/2018   GDM, class A2 05/13/2012    REFERRING DIAG: Right Shoulder pain and cervicalgia  THERAPY DIAG:  Chronic right shoulder pain  Cervicalgia  Stiffness of right shoulder, not elsewhere classified  Rationale for Evaluation and Treatment Rehabilitation  PERTINENT HISTORY:  Anxiety/depression  PRECAUTIONS: none  SUBJECTIVE:                                                                                                                                                                                      SUBJECTIVE STATEMENT:  02/05/2023 This morning I had pain in my right shoulder.  I feel a pinching In my shoulder when I move my had certain ways. My hand felt swollen this morning.   PAIN:   Are you having pain: yes, 4/10 at rest, was higher earlier  Location/description: R shoulder/neck, throbbing/aching Best-worst over past week: 0-5/10  Per eval -  - aggravating factors: cold water on hand, reaching overhead and behind back, upper body dressing - Easing factors: self massage, injections       OBJECTIVE: (objective measures completed at initial evaluation unless otherwise dated)   DIAGNOSTIC FINDINGS:  Pt states she has had XR, no remarkable findings. Unable to view in EPIC   PATIENT SURVEYS:  [name]  70.45  (spanish) 01/29/23: 63.6 % (spanish)   COGNITION: Overall cognitive status: Within functional limits for tasks assessed with assistance of in person interpreter   SENSATION: WFL BIL UE    POSTURE: forward head posture, elevated R UT    PALPATION: Concordant tenderness R infraspinatus/teres, R elbow extensor bundle and triceps. Significant nontender tightness of R UT/LS and rhomboid/mid trap              CERVICAL ROM:    Active ROM A/PROM (deg) eval AROM 01/29/23  Flexion 100% s   Extension 100%   Right lateral flexion 100%   Left lateral flexion 100%   Right rotation 60 deg pain 65  Left rotation 52 deg pain 65   (Blank rows = not tested) (Key: WFL = within functional limits not formally assessed, * = concordant pain, s = stiffness/stretching sensation, NT = not tested)    UPPER EXTREMITY ROM:   A/PROM Right eval Left eval Right 01/07/23 Right  02/03/23  Shoulder flexion 152 deg * 166 deg    Shoulder abduction 142 deg * 162 deg  165 pain with eccentric   Shoulder internal rotation    WFL   Shoulder external rotation     WFL   Elbow flexion        Elbow extension        Wrist flexion        Wrist extension         (Blank rows = not tested) (Key: WFL = within functional limits not formally assessed, * = concordant pain, s = stiffness/stretching sensation, NT = not tested)  Comments: more pain with lowering than raising on RUE   UPPER  EXTREMITY MMT:   MMT Right eval Left eval Right  01/07/23 Right 01/29/23  Shoulder flexion     4- pain from hand to neck    Shoulder extension        Shoulder abduction     4- pain from hand to neck    Shoulder extension        Shoulder internal rotation     5 no pain   Shoulder external rotation     4 pain 4+ pain  Elbow flexion        Elbow extension        Grip strength 24 kg 28 kg     (Blank rows = not tested)  (Key: WFL = within functional limits not formally assessed, * = concordant pain, s = stiffness/stretching sensation, NT = not tested)  Comments: NT on eval given symptom irritability on eval    CERVICAL SPECIAL TESTS:  NT on eval given time constraints   FUNCTIONAL TESTS:  Limited overhead reach as above   TODAY'S TREATMENT:     [facility] Adult PT Treatment:                                                DATE: 02/05/23 Therapeutic Exercise: Radial nerve glides  Standing scaption, no weight (pain with weight)  Standing bicep curl to OH press 2# x 10 Standing yellow band chest press, IR, ER x10 each  Standing Green band rows x15 Standing red band ext x 15 bilat Supine 3# at 90 deg -circles 10 each way Supine Right chest press 3# x 10 Supine right long lever AROM shoulder flexion to 90 x 10,  1# x 10,  S/L shoulder abduction 2# 1 x 10 , 3# x 10 S/L shoulder ER 3# 2 x 10 UT and levator stretches    [facility] Adult PT Treatment:                                                DATE: 02/03/23 Therapeutic Exercise: Standing scaption 3# bilateral  Standing bicep curl to OH press 2# x 8, 3# x 8  Swiss ball flexion up wall x12 cues for lean and comfortable ROM  Green band ER + scap retract  Supine 3# at 90 deg -circles 10 each way S/L shoulder abduction 2# 1 x 10 , 3# 1 x 10 S/L shoulder ER 2# 1 x 10, 3# 1 x 10 Supine Right chest press 3# x 10 Supine right long lever AROM shoulder flexion to 90 1# x 10, 2# x 10 Supine green band diagonals x 10  Standing yellow band IR  x 10   Standing yellow band chest press x 10    [facility] Adult PT Treatment:                                                DATE: 01/29/23 Therapeutic Exercise: Standing scaption 2# bil Standing abduction 2# bil x 5 -pain S/L shoulder abduction x 8, 2# x 8 S/L ER x 5, 2 # x 8 x 2  Supine chest press 2 # right  Supine 2# at 90 deg -circles 8 each way Supine shoulder flexion with dowel 2# chest press to overhead x 8 Supine ER/IR AAROM in 45 deg abduction Supine Right IR from 90deg Er 1# x 10 Right radial nerve glide     [facility] Adult PT Treatment:                                                DATE: 01/27/23 Therapeutic Exercise: Swiss ball flexion up wall x12 cues for lean and comfortable ROM  Swiss ball perturbations at wall 3x30sec cues for form, gentle pressure GTB ER + scapular retraction 3x8 cues for setup and posture  STS + fwd chest press 3# 3x5 cues for pacing and appropriate ROM  BIL scaption RTB around wrist 2x8 cues for posture and appropriate ROM  HEP review + education   [facility] Adult PT Treatment:                                                DATE: 01/23/23 Therapeutic Exercise: Radial nerve glides RUE x8 LS stretch 4x30 sec BIL cues for breath control  Swiss ball flexion rollout at table x12  B ER + scapular retraction 2x10 cues for posture  RTB around wrist BIL scaption  Radial nerve glides x10 HEP update to include radial nerve glides, significant time spent w/ education on appropriate performance, monitoring symptoms and pain modulation strategies   [facility] Adult PT Treatment:  DATE: 01-22-23 TPDN? Therapeutic Exercise: UT stretch  on R 2 x 30 sec Levator stretch R 2 x 30 sec Scap squeeze x 15 Median nerve flossing  ER bil with RTB 3 x 10 Star pattern 3 x 10  Posterior capsule/rhomboid stretch - single arm cross body- 30 sec x 3  Seated chin tuck 5 sec x 10 Supine shoulder flexion 1 x 7  but increased pain.  Told to not do  exercise at home and do the other TB exercises Manual Therapy: Trigger Point Dry Needling Treatment: through interpreter and given Spanish handout Pre-treatment instruction: Patient instructed on dry needling rationale, procedures, and possible side effects including pain during treatment (achy,cramping feeling), bruising, drop of blood, lightheadedness, nausea, sweating. Patient Consent Given: Yes Education handout provided: Yes Muscles treated: R UT, R LS R infraspinatus  Needle size and number: .30x29mm x 3 Electrical stimulation performed: No Parameters: N/A Treatment response/outcome: Twitch response elicited and Palpable decrease in muscle tension Post-treatment instructions: Patient instructed to expect possible mild to moderate muscle soreness later today and/or tomorrow. Patient instructed in methods to reduce muscle soreness and to continue prescribed HEP. If patient was dry needled over the lung field, patient was instructed on signs and symptoms of pneumothorax and, however unlikely, to see immediate medical attention should they occur. Patient was also educated on signs and symptoms of infection and to seek medical attention should they occur. Patient verbalized understanding of these instructions and education.   Modalities: Moist hot pack concurrent with exercise cervical                                                                                                                        [facility] Adult PT Treatment:                                                DATE: 01/07/23 Therapeutic Exercise: UT stretch  Scap squeeze Levator stretch Cervical rotation AROM Median nerve rocking  Posterior capsule/rhomboid stretch - single arm cross body- 30 sec x 3  Seated chin tuck 5 sec x 10 Supine shoulder flexion AAROM -chest to overhead x 10   [facility] Adult PT Treatment:                                                DATE: 01/01/23 Therapeutic Exercise: UT stretch x30sec BIL cues for  form, breath control, HEP Scapular retraction x10 cues for form, reduced UT compensation, and HEP HEP education     PATIENT EDUCATION:  Education details: rationale for interventions, relevant anatomy/physiology, HEP update Person educated: Patient Education method: Explanation, Demonstration, Tactile cues, Verbal cues, handout Education comprehension: verbalized understanding, returned demonstration, verbal cues required, tactile cues required, and  needs further education     HOME EXERCISE PROGRAM: Access Code: [email or web address] URL: [email or web address] Date: 01/23/2023 Prepared by: Fransisco Hertz  Exercises - Seated Scapular Retraction  - 1 x daily - 7 x weekly - 2 sets - 10 reps - Seated Gentle Upper Trapezius Stretch  - 1 x daily - 7 x weekly - 1-3 sets - 3 reps - 30sec hold - Standing Shoulder Posterior Capsule Stretch  - 1 x daily - 7 x weekly - 1 sets - 3 reps - 30 hold - Supine Shoulder Flexion with Dowel  - 1 x daily - 7 x weekly - 1 sets - 10 reps - 5 hold - Standing Shoulder External Rotation with Resistance  - 1 x daily - 7 x weekly - 3 sets - 10 reps - Standing Shoulder Horizontal Abduction with Resistance  - 1 x daily - 7 x weekly - 3 sets - 10 reps - Standing Shoulder Diagonal Horizontal Abduction 60/120 Degrees with Resistance  - 1 x daily - 7 x weekly - 3 sets - 10 reps - Radial Nerve Flossing  - 1 x daily - 7 x weekly - 1 sets - 10 reps    ASSESSMENT:   CLINICAL IMPRESSION: 02/05/2023 Pt arrives w/ increased pain today. She reports no issues after last session. Woke up with more pain this morning, better now, still 4/10 right shoulder. Began with nerve flossing and continued shoulder strengthening with modifications as needed for pain. Overall she tolerated session well and was able to tolerate resistance similar to last session. She continues to report overall improvement. She requires cues for posture and shoulder positioning with therex.   Will continue to  progress as tolerated.       OBJECTIVE IMPAIRMENTS: decreased activity tolerance, decreased endurance, decreased mobility, decreased ROM, decreased strength, hypomobility, impaired UE functional use, postural dysfunction, and pain.    ACTIVITY LIMITATIONS: dressing, reach over head, hygiene/grooming, and caring for others   PARTICIPATION LIMITATIONS: meal prep, cleaning, laundry, and occupation   PERSONAL FACTORS: Time since onset of injury/illness/exacerbation are also affecting patient's functional outcome.    REHAB POTENTIAL: Good   CLINICAL DECISION MAKING: Stable/uncomplicated   EVALUATION COMPLEXITY: Low     GOALS: Goals reviewed with patient? No   SHORT TERM GOALS: Target date: 01/29/2023 Pt will demonstrate appropriate understanding and performance of initially prescribed HEP in order to facilitate improved independence with management of symptoms.  Baseline: HEP provided on eval 01/29/23: independent and compliant Goal status: MET   2. Pt will score less than or equal to 60 on QuickDASH in order to demonstrate improved perception of function due to symptoms.            Baseline: 70.45  01/29/23: 63.6%             Goal status: ONGOING   LONG TERM GOALS: Target date: 02/26/2023 Pt will score 50 or less on QuickDASH  in order to demonstrate improved perception of function due to symptoms. Baseline: 70.45 01/29/23: 63.6% Goal status: ONGOING   2. Pt will demonstrate grossly symmetrical cervical rotation ROM in order to demonstrate improved environmental awareness. Baseline: see ROM chart above Goal status: INITIAL   3. Pt will demonstrate at least 4+/5 shoulder MMT for improved symmetry of UE strength and improved tolerance to functional movements.  Baseline: see MMT chart above Goal status: INITIAL    4. Pt will report ability to perform upper body dressing with less than 2 point increase on NPS in order to  demonstrate improved tolerance to ADLs. Baseline: up to 9/10  pain with ADLs 02/03/23: is still careful : has pain with taking off coat (shoulder ext and IR ) Goal status: ONGOING   5. Pt will demonstrate grossly symmetrical grip strength in order to demonstrate improved functional strength/dexterity.            Baseline: see MMT chart above            Goal status: INITIAL    6. Pt will report at least 50% increase in overall pain in past week in order to facilitate improved tolerance to basic ADLs/mobility.             Baseline: 2-9/10 pain             Goal status: INITIAL       PLAN:   PT FREQUENCY: 1-2x/week   PT DURATION: 8 weeks (ending week of 02/27/23)   PLANNED INTERVENTIONS: Therapeutic exercises, Therapeutic activity, Neuromuscular re-education, Balance training, Gait training, Patient/Family education, Self Care, Joint mobilization, DME instructions, Aquatic Therapy, Dry Needling, Electrical stimulation, Spinal mobilization, Cryotherapy, Moist heat, Taping, Manual therapy, and Re-evaluation   PLAN FOR NEXT SESSION: radial nerve mobilization PRN for symptom modification. GH/periscapular strengthening, gradually working into more long lever and overhead positions     Jannette Spanner, PTA 02/05/23 9:35 AM Phone: [phone] Fax: [phone]

## 2023-02-10 ENCOUNTER — Encounter: Payer: Self-pay | Admitting: Physical Therapy

## 2023-02-10 ENCOUNTER — Ambulatory Visit: Payer: Medicaid Other | Admitting: Physical Therapy

## 2023-02-10 DIAGNOSIS — M542 Cervicalgia: Secondary | ICD-10-CM

## 2023-02-10 DIAGNOSIS — G8929 Other chronic pain: Secondary | ICD-10-CM

## 2023-02-10 DIAGNOSIS — M25611 Stiffness of right shoulder, not elsewhere classified: Secondary | ICD-10-CM

## 2023-02-10 DIAGNOSIS — M25511 Pain in right shoulder: Secondary | ICD-10-CM | POA: Diagnosis not present

## 2023-02-10 NOTE — Therapy (Signed)
OUTPATIENT PHYSICAL THERAPY TREATMENT NOTE   Patient Name: Michelle Cross MRN: [id number] DOB:16-Mar-1981, 42 y.o., female Today's Date: 02/10/2023  PCP: Kathreen Cosier, MD   REFERRING PROVIDER: Teryl Lucy, MD    END OF SESSION:   PT End of Session - 02/10/23 0850     Visit Number 9    Number of Visits 17    Date for PT Re-Evaluation 02/26/23    Authorization Type [employer]    Authorization Time Period no auth first 27 visits    PT Start Time 0849    PT Stop Time 0928    PT Time Calculation (min) 39 min               Past Medical History:  Diagnosis Date   Anxiety    Depression    Gestational diabetes    glyburide w/1st, diet control w/2nd, 3rd & 4th   Preterm labor    delivery   Past Surgical History:  Procedure Laterality Date   APPENDECTOMY     Patient Active Problem List   Diagnosis Date Noted   Gestational diabetes mellitus (GDM) affecting pregnancy, antepartum 04/23/2019   Supervision of high risk pregnancy, antepartum 12/08/2018   History of preterm delivery, currently pregnant 12/08/2018   History of gestational diabetes in prior pregnancy, currently pregnant 12/08/2018   Language barrier 12/08/2018   Multigravida of advanced maternal age 109/07/2019   GDM, class A2 05/13/2012    REFERRING DIAG: Right Shoulder pain and cervicalgia  THERAPY DIAG:  Chronic right shoulder pain  Cervicalgia  Stiffness of right shoulder, not elsewhere classified  Rationale for Evaluation and Treatment Rehabilitation  PERTINENT HISTORY:  Anxiety/depression  PRECAUTIONS: none  SUBJECTIVE:                                                                                                                                                                                      SUBJECTIVE STATEMENT:  02/10/2023 I went to the doctor and he gave me an injection in my shoulder to relax all of my muscles. That helped a lot. I still have a little pain at outside  shoulder but nothing like it was before.     PAIN:  Are you having pain: yes, 0/10 at rest Location/description: R shoulder/neck, throbbing/aching Best-worst over past week: 0-5/10  Per eval -  - aggravating factors: cold water on hand, reaching overhead and behind back, upper body dressing - Easing factors: self massage, injections       OBJECTIVE: (objective measures completed at initial evaluation unless otherwise dated)   DIAGNOSTIC FINDINGS:  Pt states she has had XR, no remarkable findings. Unable to view in EPIC  PATIENT SURVEYS:  Quick Dash 70.45  (spanish) 01/29/23: 63.6 % (spanish)   COGNITION: Overall cognitive status: Within functional limits for tasks assessed with assistance of in person interpreter   SENSATION: WFL BIL UE    POSTURE: forward head posture, elevated R UT    PALPATION: Concordant tenderness R infraspinatus/teres, R elbow extensor bundle and triceps. Significant nontender tightness of R UT/LS and rhomboid/mid trap              CERVICAL ROM:    Active ROM A/PROM (deg) eval AROM 01/29/23  Flexion 100% s   Extension 100%   Right lateral flexion 100%   Left lateral flexion 100%   Right rotation 60 deg pain 65  Left rotation 52 deg pain 65   (Blank rows = not tested) (Key: WFL = within functional limits not formally assessed, * = concordant pain, s = stiffness/stretching sensation, NT = not tested)    UPPER EXTREMITY ROM:   A/PROM Right eval Left eval Right 01/07/23 Right  02/03/23 Right  02/10/23  Shoulder flexion 152 deg * 166 deg     Shoulder abduction 142 deg * 162 deg  165 pain with eccentric  165 Painful arc  Shoulder internal rotation    WFL    Shoulder external rotation     WFL    Elbow flexion         Elbow extension         Wrist flexion         Wrist extension          (Blank rows = not tested) (Key: WFL = within functional limits not formally assessed, * = concordant pain, s = stiffness/stretching sensation, NT = not  tested)  Comments: more pain with lowering than raising on RUE   UPPER EXTREMITY MMT:   MMT Right eval Left eval Right  01/07/23 Right 01/29/23 Right  02/10/23  Shoulder flexion     4- pain from hand to neck   4 min pain(electric)  Shoulder extension         Shoulder abduction     4- pain from hand to neck   4- mod  (electric)  Shoulder extension         Shoulder internal rotation     5 no pain    Shoulder external rotation     4 pain 4+ pain 4 no pain  Elbow flexion         Elbow extension         Grip strength 24 kg 28 kg    29 kg  (Blank rows = not tested)  (Key: WFL = within functional limits not formally assessed, * = concordant pain, s = stiffness/stretching sensation, NT = not tested)  Comments: NT on eval given symptom irritability on eval    CERVICAL SPECIAL TESTS:  NT on eval given time constraints   FUNCTIONAL TESTS:  Limited overhead reach as above   TODAY'S TREATMENT:     [facility] Adult PT Treatment:                                                DATE: 02/10/23 Therapeutic Exercise: UBE L2 2 min each way  Standing bicep curl to OH press 3# x 10, 4# x 10 Scaption bilat 3# x 10  Standing scap retract with ER green x 20  Supine Green band diagonals to fatigure right Supine 4# at 90 deg -circles 10 each way Supine Right chest press 4# 2 x 10 ( with protraction)  Supine right long lever AROM shoulder flexion x 10,  2# x 10, 3# x 10  S/L shoulder ER 3# 1 x 15 S/L shoulder abduction  1 x 10 , 3# x 10 UT and levator stretches Standing forward raise x 10, 2# x 10      [facility] Adult PT Treatment:                                                DATE: 02/05/23 Therapeutic Exercise: Radial nerve glides  Standing scaption, no weight (pain with weight)  Standing bicep curl to OH press 2# x 10 Standing yellow band chest press, IR, ER x10 each  Standing Green band rows x15 Standing red band ext x 15 bilat Supine 3# at 90 deg -circles 10 each way Supine Right chest press 3# x  10 Supine right long lever AROM shoulder flexion to 90 x 10,  1# x 10,  S/L shoulder abduction 2# 1 x 10 , 3# x 10 S/L shoulder ER 3# 2 x 10 UT and levator stretches    [facility] Adult PT Treatment:                                                DATE: 02/03/23 Therapeutic Exercise: Standing scaption 3# bilateral  Standing bicep curl to OH press 2# x 8, 3# x 8  Swiss ball flexion up wall x12 cues for lean and comfortable ROM  Green band ER + scap retract  Supine 3# at 90 deg -circles 10 each way S/L shoulder abduction 2# 1 x 10 , 3# 1 x 10 S/L shoulder ER 2# 1 x 10, 3# 1 x 10 Supine Right chest press 3# x 10 Supine right long lever AROM shoulder flexion to 90 1# x 10, 2# x 10 Supine green band diagonals x 10  Standing yellow band IR  x 10  Standing yellow band chest press x 10    [facility] Adult PT Treatment:                                                DATE: 01/29/23 Therapeutic Exercise: Standing scaption 2# bil Standing abduction 2# bil x 5 -pain S/L shoulder abduction x 8, 2# x 8 S/L ER x 5, 2 # x 8 x 2  Supine chest press 2 # right  Supine 2# at 90 deg -circles 8 each way Supine shoulder flexion with dowel 2# chest press to overhead x 8 Supine ER/IR AAROM in 45 deg abduction Supine Right IR from 90deg Er 1# x 10 Right radial nerve glide          PATIENT EDUCATION:  Education details: rationale for interventions, relevant anatomy/physiology, HEP update Person educated: Patient Education method: Explanation, Demonstration, Tactile cues, Verbal cues, handout Education comprehension: verbalized understanding, returned demonstration, verbal cues required, tactile cues required, and needs further education     HOME EXERCISE PROGRAM: Access Code: [email or web address]  URL: [email or web address] Date: 01/23/2023 Prepared by: Fransisco Hertz  Exercises - Seated Scapular Retraction  - 1 x daily - 7 x weekly - 2 sets - 10 reps - Seated Gentle Upper Trapezius Stretch  - 1 x daily  - 7 x weekly - 1-3 sets - 3 reps - 30sec hold - Standing Shoulder Posterior Capsule Stretch  - 1 x daily - 7 x weekly - 1 sets - 3 reps - 30 hold - Supine Shoulder Flexion with Dowel  - 1 x daily - 7 x weekly - 1 sets - 10 reps - 5 hold - Standing Shoulder External Rotation with Resistance  - 1 x daily - 7 x weekly - 3 sets - 10 reps - Standing Shoulder Horizontal Abduction with Resistance  - 1 x daily - 7 x weekly - 3 sets - 10 reps - Standing Shoulder Diagonal Horizontal Abduction 60/120 Degrees with Resistance  - 1 x daily - 7 x weekly - 3 sets - 10 reps - Radial Nerve Flossing  - 1 x daily - 7 x weekly - 1 sets - 10 reps    ASSESSMENT:   CLINICAL IMPRESSION: 02/10/2023 Pt arrives w/ decreased pain today. She reports no issues after last session. She saw MD Friday who gave her an injection to relax her shoulder motions. She reports improvement since the injection. She reports overall pain is much better however still has a low level pain at lateral shoulder with some movements. Grip strength improved and cervical rotation improved to symmetrical.LTG#  2 & #5 met. Pain still present with MMT testing. Continued with strengthening while modifying for pain and giving cues for proper shoulder positioning. Able to complete long lever supine shoulder flexion with resistance, no pain. Can complete standing forward raise with 2# resistance and no pain.   Will continue to progress as tolerated.       OBJECTIVE IMPAIRMENTS: decreased activity tolerance, decreased endurance, decreased mobility, decreased ROM, decreased strength, hypomobility, impaired UE functional use, postural dysfunction, and pain.    ACTIVITY LIMITATIONS: dressing, reach over head, hygiene/grooming, and caring for others   PARTICIPATION LIMITATIONS: meal prep, cleaning, laundry, and occupation   PERSONAL FACTORS: Time since onset of injury/illness/exacerbation are also affecting patient's functional outcome.    REHAB POTENTIAL:  Good   CLINICAL DECISION MAKING: Stable/uncomplicated   EVALUATION COMPLEXITY: Low     GOALS: Goals reviewed with patient? No   SHORT TERM GOALS: Target date: 01/29/2023 Pt will demonstrate appropriate understanding and performance of initially prescribed HEP in order to facilitate improved independence with management of symptoms.  Baseline: HEP provided on eval 01/29/23: independent and compliant Goal status: MET   2. Pt will score less than or equal to 60 on QuickDASH in order to demonstrate improved perception of function due to symptoms.            Baseline: 70.45  01/29/23: 63.6%             Goal status: ONGOING   LONG TERM GOALS: Target date: 02/26/2023 Pt will score 50 or less on QuickDASH  in order to demonstrate improved perception of function due to symptoms. Baseline: 70.45 01/29/23: 63.6% Goal status: ONGOING   2. Pt will demonstrate grossly symmetrical cervical rotation ROM in order to demonstrate improved environmental awareness. Baseline: see ROM chart above 01/29/23: 65 each  Goal status: MET    3. Pt will demonstrate at least 4+/5 shoulder MMT for improved symmetry of UE strength and improved tolerance to functional movements.  Baseline: see MMT chart above Goal status: INITIAL    4. Pt will report ability to perform upper body dressing with less than 2 point increase on NPS in order to demonstrate improved tolerance to ADLs. Baseline: up to 9/10 pain with ADLs 02/03/23: is still careful : has pain with taking off coat (shoulder ext and IR ) Goal status: ONGOING   5. Pt will demonstrate grossly symmetrical grip strength in order to demonstrate improved functional strength/dexterity.            Baseline: see MMT chart above  02/10/23: 28kg left, 29 kg right            Goal status: MET   6. Pt will report at least 50% increase in overall pain in past week in order to facilitate improved tolerance to basic ADLs/mobility.             Baseline: 2-9/10 pain              Goal status: ONGOING     PLAN:   PT FREQUENCY: 1-2x/week   PT DURATION: 8 weeks (ending week of 02/27/23)   PLANNED INTERVENTIONS: Therapeutic exercises, Therapeutic activity, Neuromuscular re-education, Balance training, Gait training, Patient/Family education, Self Care, Joint mobilization, DME instructions, Aquatic Therapy, Dry Needling, Electrical stimulation, Spinal mobilization, Cryotherapy, Moist heat, Taping, Manual therapy, and Re-evaluation   PLAN FOR NEXT SESSION: radial nerve mobilization PRN for symptom modification. GH/periscapular strengthening, gradually working into more long lever and overhead positions     Jannette Spanner, PTA 02/10/23 9:32 AM Phone: [phone] Fax: [phone]

## 2023-02-11 NOTE — Therapy (Signed)
OUTPATIENT PHYSICAL THERAPY TREATMENT NOTE   Patient Name: Michelle Cross MRN: [id number] DOB:[birth year], 42 y.o., female Today's Date: 02/12/2023  PCP: Kathreen Cosier, MD   REFERRING PROVIDER: Teryl Lucy, MD    END OF SESSION:   PT End of Session - 02/12/23 0841     Visit Number 10    Number of Visits 17    Date for PT Re-Evaluation 02/26/23    Authorization Type [facility]    Authorization Time Period no auth first 27 visits    PT Start Time 0845    PT Stop Time 0929    PT Time Calculation (min) 44 min    Activity Tolerance Patient tolerated treatment well;No increased pain    Behavior During Therapy WFL for tasks assessed/performed                Past Medical History:  Diagnosis Date   Anxiety    Depression    Gestational diabetes    glyburide w/1st, diet control w/2nd, 3rd & 4th   Preterm labor    delivery   Past Surgical History:  Procedure Laterality Date   APPENDECTOMY     Patient Active Problem List   Diagnosis Date Noted   Gestational diabetes mellitus (GDM) affecting pregnancy, antepartum 04/23/2019   Supervision of high risk pregnancy, antepartum 12/08/2018   History of preterm delivery, currently pregnant 12/08/2018   History of gestational diabetes in prior pregnancy, currently pregnant 12/08/2018   Language barrier 12/08/2018   Multigravida of advanced maternal age 74/07/2019   GDM, class A2 05/13/2012    REFERRING DIAG: Right Shoulder pain and cervicalgia  THERAPY DIAG:  Chronic right shoulder pain  Cervicalgia  Stiffness of right shoulder, not elsewhere classified  Rationale for Evaluation and Treatment Rehabilitation  PERTINENT HISTORY:  Anxiety/depression  PRECAUTIONS: none  SUBJECTIVE:                                                                                                                                                                                      SUBJECTIVE STATEMENT:  02/12/2023 Pt states she has  no pain at present, a little bit of soreness after last session but not bad. States her primary issues is waking up at night due to laying on her arm, some mild lateral pain with reaching.    PAIN:  Are you having pain: yes, 0/10 at rest Location/description: R shoulder/neck, throbbing/aching Best-worst over past week: 0-4/10  Per eval -  - aggravating factors: cold water on hand, reaching overhead and behind back, upper body dressing - Easing factors: self massage, injections       OBJECTIVE: (objective measures completed at initial evaluation  unless otherwise dated)   DIAGNOSTIC FINDINGS:  Pt states she has had XR, no remarkable findings. Unable to view in EPIC   PATIENT SURVEYS:  Quick Dash 70.45  (spanish) 01/29/23: 63.6 % (spanish)   COGNITION: Overall cognitive status: Within functional limits for tasks assessed with assistance of in person interpreter   SENSATION: WFL BIL UE    POSTURE: forward head posture, elevated R UT    PALPATION: Concordant tenderness R infraspinatus/teres, R elbow extensor bundle and triceps. Significant nontender tightness of R UT/LS and rhomboid/mid trap              CERVICAL ROM:    Active ROM A/PROM (deg) eval AROM 01/29/23  Flexion 100% s   Extension 100%   Right lateral flexion 100%   Left lateral flexion 100%   Right rotation 60 deg pain 65  Left rotation 52 deg pain 65   (Blank rows = not tested) (Key: WFL = within functional limits not formally assessed, * = concordant pain, s = stiffness/stretching sensation, NT = not tested)    UPPER EXTREMITY ROM:   A/PROM Right eval Left eval Right 01/07/23 Right  02/03/23 Right  02/10/23  Shoulder flexion 152 deg * 166 deg     Shoulder abduction 142 deg * 162 deg  165 pain with eccentric  165 Painful arc  Shoulder internal rotation    WFL    Shoulder external rotation     WFL    Elbow flexion         Elbow extension         Wrist flexion         Wrist extension          (Blank rows  = not tested) (Key: WFL = within functional limits not formally assessed, * = concordant pain, s = stiffness/stretching sensation, NT = not tested)  Comments: more pain with lowering than raising on RUE   UPPER EXTREMITY MMT:   MMT Right eval Left eval Right  01/07/23 Right 01/29/23 Right  02/10/23  Shoulder flexion     4- pain from hand to neck   4 min pain(electric)  Shoulder extension         Shoulder abduction     4- pain from hand to neck   4- mod  (electric)  Shoulder extension         Shoulder internal rotation     5 no pain    Shoulder external rotation     4 pain 4+ pain 4 no pain  Elbow flexion         Elbow extension         Grip strength 24 kg 28 kg    29 kg  (Blank rows = not tested)  (Key: WFL = within functional limits not formally assessed, * = concordant pain, s = stiffness/stretching sensation, NT = not tested)  Comments: NT on eval given symptom irritability on eval    CERVICAL SPECIAL TESTS:  NT on eval given time constraints   FUNCTIONAL TESTS:  Limited overhead reach as above   TODAY'S TREATMENT:     [facility] Adult PT Treatment:                                                DATE: 02/12/23 Therapeutic Exercise: 5# suitcase carry 2x87ft, 10# x62ft BIL cues for  posture  Waiter carry 4# BIL 2x49ft cues for posture  CC row 7# each UE 2x12 cues for setup and posture  CC high>low row 7# x10, 10# x10  CC lat deltoid raise 3# RUE x8 cues for setup 4# DB lat raise x8 standing, cues for appropriate ROM and setup Modified counter plank 2x45sec cues for posture and muscular tension  Shoulder flexion walkbacks at counter x10 cues for appropriate ROM and breath control  Sidelying RUE abduction AROM x10 emphasis on full ROM  HEP update + handout/education, provided w/ green band   [facility] Adult PT Treatment:                                                DATE: 02/10/23 Therapeutic Exercise: UBE L2 2 min each way  Standing bicep curl to OH press 3# x 10, 4# x 10 Scaption  bilat 3# x 10  Standing scap retract with ER green x 20  Supine Green band diagonals to fatigure right Supine 4# at 90 deg -circles 10 each way Supine Right chest press 4# 2 x 10 ( with protraction)  Supine right long lever AROM shoulder flexion x 10,  2# x 10, 3# x 10  S/L shoulder ER 3# 1 x 15 S/L shoulder abduction  1 x 10 , 3# x 10 UT and levator stretches Standing forward raise x 10, 2# x 10      [facility] Adult PT Treatment:                                                DATE: 02/05/23 Therapeutic Exercise: Radial nerve glides  Standing scaption, no weight (pain with weight)  Standing bicep curl to OH press 2# x 10 Standing yellow band chest press, IR, ER x10 each  Standing Green band rows x15 Standing red band ext x 15 bilat Supine 3# at 90 deg -circles 10 each way Supine Right chest press 3# x 10 Supine right long lever AROM shoulder flexion to 90 x 10,  1# x 10,  S/L shoulder abduction 2# 1 x 10 , 3# x 10 S/L shoulder ER 3# 2 x 10 UT and levator stretches    [facility] Adult PT Treatment:                                                DATE: 02/03/23 Therapeutic Exercise: Standing scaption 3# bilateral  Standing bicep curl to OH press 2# x 8, 3# x 8  Swiss ball flexion up wall x12 cues for lean and comfortable ROM  Green band ER + scap retract  Supine 3# at 90 deg -circles 10 each way S/L shoulder abduction 2# 1 x 10 , 3# 1 x 10 S/L shoulder ER 2# 1 x 10, 3# 1 x 10 Supine Right chest press 3# x 10 Supine right long lever AROM shoulder flexion to 90 1# x 10, 2# x 10 Supine green band diagonals x 10  Standing yellow band IR  x 10  Standing yellow band chest press x 10    [facility] Adult PT Treatment:  DATE: 01/29/23 Therapeutic Exercise: Standing scaption 2# bil Standing abduction 2# bil x 5 -pain S/L shoulder abduction x 8, 2# x 8 S/L ER x 5, 2 # x 8 x 2  Supine chest press 2 # right  Supine 2# at 90 deg -circles 8 each way Supine  shoulder flexion with dowel 2# chest press to overhead x 8 Supine ER/IR AAROM in 45 deg abduction Supine Right IR from 90deg Er 1# x 10 Right radial nerve glide          PATIENT EDUCATION:  Education details: rationale for interventions, relevant anatomy/physiology, HEP update Person educated: Patient Education method: Explanation, Demonstration, Tactile cues, Verbal cues, handout Education comprehension: verbalized understanding, returned demonstration, verbal cues required, tactile cues required, and needs further education     HOME EXERCISE PROGRAM: Access Code: [email or web address] URL: [email or web address] Date: 02/12/2023 Prepared by: Fransisco Hertz  Exercises - Standing Shoulder External Rotation with Resistance  - 1 x daily - 7 x weekly - 2 sets - 10 reps - Standing Shoulder Horizontal Abduction with Resistance  - 1 x daily - 7 x weekly - 2 sets - 10 reps - Standing Shoulder Diagonal Horizontal Abduction 60/120 Degrees with Resistance  - 1 x daily - 7 x weekly - 2 sets - 10 reps - Standing High Row with Resistance  - 1 x daily - 7 x weekly - 2 sets - 10 reps    ASSESSMENT:   CLINICAL IMPRESSION: 02/12/2023 Pt arrives without pain, continues to endorse steady improvement, mild pain with reaching and lying on her arm. Today progressing for increased emphasis on GH/periscapular strength and closed chain GH stability. Pt tolerates quite well, no pain or UE referred symptoms, primary report of muscular fatigue. Mild "pinching" sensation reported during cable column delt raises, denies any pain and does not increase in intensity. Improves with modification to DB delt raise. Reports improved fatigue after stretching at end of session. Departs without any pain, no adverse events. Recommend continuing along current POC in order to address relevant deficits and improve functional tolerance. Pt departs today's session in no acute distress, all voiced questions/concerns addressed  appropriately from PT perspective.        OBJECTIVE IMPAIRMENTS: decreased activity tolerance, decreased endurance, decreased mobility, decreased ROM, decreased strength, hypomobility, impaired UE functional use, postural dysfunction, and pain.    ACTIVITY LIMITATIONS: dressing, reach over head, hygiene/grooming, and caring for others   PARTICIPATION LIMITATIONS: meal prep, cleaning, laundry, and occupation   PERSONAL FACTORS: Time since onset of injury/illness/exacerbation are also affecting patient's functional outcome.    REHAB POTENTIAL: Good   CLINICAL DECISION MAKING: Stable/uncomplicated   EVALUATION COMPLEXITY: Low     GOALS: Goals reviewed with patient? No   SHORT TERM GOALS: Target date: 01/29/2023 Pt will demonstrate appropriate understanding and performance of initially prescribed HEP in order to facilitate improved independence with management of symptoms.  Baseline: HEP provided on eval 01/29/23: independent and compliant Goal status: MET   2. Pt will score less than or equal to 60 on QuickDASH in order to demonstrate improved perception of function due to symptoms.            Baseline: 70.45  01/29/23: 63.6%             Goal status: ONGOING   LONG TERM GOALS: Target date: 02/26/2023 Pt will score 50 or less on QuickDASH  in order to demonstrate improved perception of function due to symptoms. Baseline: 70.45 01/29/23: 63.6% Goal status: ONGOING  2. Pt will demonstrate grossly symmetrical cervical rotation ROM in order to demonstrate improved environmental awareness. Baseline: see ROM chart above 01/29/23: 65 each  Goal status: MET    3. Pt will demonstrate at least 4+/5 shoulder MMT for improved symmetry of UE strength and improved tolerance to functional movements.  Baseline: see MMT chart above Goal status: INITIAL    4. Pt will report ability to perform upper body dressing with less than 2 point increase on NPS in order to demonstrate improved tolerance to  ADLs. Baseline: up to 9/10 pain with ADLs 02/03/23: is still careful : has pain with taking off coat (shoulder ext and IR ) Goal status: ONGOING   5. Pt will demonstrate grossly symmetrical grip strength in order to demonstrate improved functional strength/dexterity.            Baseline: see MMT chart above  02/10/23: 28kg left, 29 kg right            Goal status: MET   6. Pt will report at least 50% increase in overall pain in past week in order to facilitate improved tolerance to basic ADLs/mobility.             Baseline: 2-9/10 pain             Goal status: ONGOING     PLAN:   PT FREQUENCY: 1-2x/week   PT DURATION: 8 weeks (ending week of 02/27/23)   PLANNED INTERVENTIONS: Therapeutic exercises, Therapeutic activity, Neuromuscular re-education, Balance training, Gait training, Patient/Family education, Self Care, Joint mobilization, DME instructions, Aquatic Therapy, Dry Needling, Electrical stimulation, Spinal mobilization, Cryotherapy, Moist heat, Taping, Manual therapy, and Re-evaluation   PLAN FOR NEXT SESSION: radial nerve mobilization PRN for symptom modification. GH/periscapular strengthening, gradually working into more long lever and overhead positions     Ashley Murrain PT, DPT 02/12/2023 9:36 AM

## 2023-02-12 ENCOUNTER — Ambulatory Visit: Payer: Medicaid Other | Admitting: Physical Therapy

## 2023-02-12 ENCOUNTER — Encounter: Payer: Self-pay | Admitting: Physical Therapy

## 2023-02-12 DIAGNOSIS — M542 Cervicalgia: Secondary | ICD-10-CM

## 2023-02-12 DIAGNOSIS — G8929 Other chronic pain: Secondary | ICD-10-CM

## 2023-02-12 DIAGNOSIS — M25611 Stiffness of right shoulder, not elsewhere classified: Secondary | ICD-10-CM

## 2023-02-12 DIAGNOSIS — M25511 Pain in right shoulder: Secondary | ICD-10-CM | POA: Diagnosis not present

## 2023-02-16 NOTE — Therapy (Signed)
OUTPATIENT PHYSICAL THERAPY TREATMENT NOTE   Patient Name: Latece Bidgood MRN: [id number] DOB:March 06, 1981, 42 y.o., female Today's Date: 02/17/2023  PCP: Kathreen Cosier, MD   REFERRING PROVIDER: Teryl Lucy, MD    END OF SESSION:   PT End of Session - 02/17/23 0845     Visit Number 11    Number of Visits 17    Date for PT Re-Evaluation 02/26/23    Authorization Type [facility]    Authorization Time Period no auth first 27 visits    PT Start Time 0846    PT Stop Time 0927    PT Time Calculation (min) 41 min    Activity Tolerance Patient tolerated treatment well;No increased pain    Behavior During Therapy WFL for tasks assessed/performed                 Past Medical History:  Diagnosis Date   Anxiety    Depression    Gestational diabetes    glyburide w/1st, diet control w/2nd, 3rd & 4th   Preterm labor    delivery   Past Surgical History:  Procedure Laterality Date   APPENDECTOMY     Patient Active Problem List   Diagnosis Date Noted   Gestational diabetes mellitus (GDM) affecting pregnancy, antepartum 04/23/2019   Supervision of high risk pregnancy, antepartum 12/08/2018   History of preterm delivery, currently pregnant 12/08/2018   History of gestational diabetes in prior pregnancy, currently pregnant 12/08/2018   Language barrier 12/08/2018   Multigravida of advanced maternal age 43/07/2019   GDM, class A2 05/13/2012    REFERRING DIAG: Right Shoulder pain and cervicalgia  THERAPY DIAG:  Chronic right shoulder pain  Cervicalgia  Stiffness of right shoulder, not elsewhere classified  Rationale for Evaluation and Treatment Rehabilitation  PERTINENT HISTORY:  Anxiety/depression  PRECAUTIONS: none  SUBJECTIVE:                                                                                                                                                                                      SUBJECTIVE STATEMENT:  02/17/2023 Pt arrives w/o  pain, states she really hasn't had any pain since last session with exception of when she rolls on to her R arm while sleeping, and this resolves quickly with positional changes. HEP going well without issue.     PAIN:  Are you having pain: 0/10 Location/description: R shoulder/neck, throbbing/aching Best-worst over past week: 0-4/10  Per eval -  - aggravating factors: cold water on hand, reaching overhead and behind back, upper body dressing - Easing factors: self massage, injections       OBJECTIVE: (objective measures completed at initial evaluation unless otherwise  dated)   DIAGNOSTIC FINDINGS:  Pt states she has had XR, no remarkable findings. Unable to view in EPIC   PATIENT SURVEYS:  Quick Dash 70.45  (spanish) 01/29/23: 63.6 % (spanish)   COGNITION: Overall cognitive status: Within functional limits for tasks assessed with assistance of in person interpreter   SENSATION: WFL BIL UE    POSTURE: forward head posture, elevated R UT    PALPATION: Concordant tenderness R infraspinatus/teres, R elbow extensor bundle and triceps. Significant nontender tightness of R UT/LS and rhomboid/mid trap              CERVICAL ROM:    Active ROM A/PROM (deg) eval AROM 01/29/23  Flexion 100% s   Extension 100%   Right lateral flexion 100%   Left lateral flexion 100%   Right rotation 60 deg pain 65  Left rotation 52 deg pain 65   (Blank rows = not tested) (Key: WFL = within functional limits not formally assessed, * = concordant pain, s = stiffness/stretching sensation, NT = not tested)    UPPER EXTREMITY ROM:   A/PROM Right eval Left eval Right 01/07/23 Right  02/03/23 Right  02/10/23  Shoulder flexion 152 deg * 166 deg     Shoulder abduction 142 deg * 162 deg  165 pain with eccentric  165 Painful arc  Shoulder internal rotation    WFL    Shoulder external rotation     WFL    Elbow flexion         Elbow extension         Wrist flexion         Wrist extension           (Blank rows = not tested) (Key: WFL = within functional limits not formally assessed, * = concordant pain, s = stiffness/stretching sensation, NT = not tested)  Comments: more pain with lowering than raising on RUE   UPPER EXTREMITY MMT:   MMT Right eval Left eval Right  01/07/23 Right 01/29/23 Right  02/10/23  Shoulder flexion     4- pain from hand to neck   4 min pain(electric)  Shoulder extension         Shoulder abduction     4- pain from hand to neck   4- mod  (electric)  Shoulder extension         Shoulder internal rotation     5 no pain    Shoulder external rotation     4 pain 4+ pain 4 no pain  Elbow flexion         Elbow extension         Grip strength 24 kg 28 kg    29 kg  (Blank rows = not tested)  (Key: WFL = within functional limits not formally assessed, * = concordant pain, s = stiffness/stretching sensation, NT = not tested)  Comments: NT on eval given symptom irritability on eval    CERVICAL SPECIAL TESTS:  NT on eval given time constraints   FUNCTIONAL TESTS:  Limited overhead reach as above   TODAY'S TREATMENT:     [facility] Adult PT Treatment:                                                DATE: 02/17/23 Therapeutic Exercise: Modified counter plank + UE reaches 2x15 BIL cues for pacing  3 way RTB closed chain reaches at wall 3x5 BIL cues for appropriate reaching mechanics Counter push ups 2x12 cues for shoulder mechanics  2# DB 3 way raise (flex/scap/abd) 3x5 cues for shoulder mechanics  Chin tuck x10, seated  Sidelying shoulder abduction AROM 2x12 cues for pacing and full ROM, second set with 2# High>low CC row 10# x12, 13# 2x10 cues for scapular mechanics  HEP update + education w/ handout   [facility] Adult PT Treatment:                                                DATE: 02/12/23 Therapeutic Exercise: 5# suitcase carry 2x5ft, 10# x31ft BIL cues for posture  Waiter carry 4# BIL 2x55ft cues for posture  CC row 7# each UE 2x12 cues for setup and posture  CC  high>low row 7# x10, 10# x10  CC lat deltoid raise 3# RUE x8 cues for setup 4# DB lat raise x8 standing, cues for appropriate ROM and setup Modified counter plank 2x45sec cues for posture and muscular tension  Shoulder flexion walkbacks at counter x10 cues for appropriate ROM and breath control  Sidelying RUE abduction AROM x10 emphasis on full ROM  HEP update + handout/education, provided w/ green band   [facility] Adult PT Treatment:                                                DATE: 02/10/23 Therapeutic Exercise: UBE L2 2 min each way  Standing bicep curl to OH press 3# x 10, 4# x 10 Scaption bilat 3# x 10  Standing scap retract with ER green x 20  Supine Green band diagonals to fatigure right Supine 4# at 90 deg -circles 10 each way Supine Right chest press 4# 2 x 10 ( with protraction)  Supine right long lever AROM shoulder flexion x 10,  2# x 10, 3# x 10  S/L shoulder ER 3# 1 x 15 S/L shoulder abduction  1 x 10 , 3# x 10 UT and levator stretches Standing forward raise x 10, 2# x 10      [facility] Adult PT Treatment:                                                DATE: 02/05/23 Therapeutic Exercise: Radial nerve glides  Standing scaption, no weight (pain with weight)  Standing bicep curl to OH press 2# x 10 Standing yellow band chest press, IR, ER x10 each  Standing Green band rows x15 Standing red band ext x 15 bilat Supine 3# at 90 deg -circles 10 each way Supine Right chest press 3# x 10 Supine right long lever AROM shoulder flexion to 90 x 10,  1# x 10,  S/L shoulder abduction 2# 1 x 10 , 3# x 10 S/L shoulder ER 3# 2 x 10 UT and levator stretches    [facility] Adult PT Treatment:  DATE: 02/03/23 Therapeutic Exercise: Standing scaption 3# bilateral  Standing bicep curl to [facility] press 2# x 8, 3# x 8  Swiss ball flexion up wall x12 cues for lean and comfortable ROM  Green band ER + scap retract  Supine 3# at 90 deg -circles 10 each  way S/L shoulder abduction 2# 1 x 10 , 3# 1 x 10 S/L shoulder ER 2# 1 x 10, 3# 1 x 10 Supine Right chest press 3# x 10 Supine right long lever AROM shoulder flexion to 90 1# x 10, 2# x 10 Supine green band diagonals x 10  Standing yellow band IR  x 10  Standing yellow band chest press x 10    [facility] Adult PT Treatment:                                                DATE: 01/29/23 Therapeutic Exercise: Standing scaption 2# bil Standing abduction 2# bil x 5 -pain S/L shoulder abduction x 8, 2# x 8 S/L ER x 5, 2 # x 8 x 2  Supine chest press 2 # right  Supine 2# at 90 deg -circles 8 each way Supine shoulder flexion with dowel 2# chest press to overhead x 8 Supine ER/IR AAROM in 45 deg abduction Supine Right IR from 90deg Er 1# x 10 Right radial nerve glide          PATIENT EDUCATION:  Education details: rationale for interventions, relevant anatomy/physiology, HEP update Person educated: Patient Education method: Explanation, Demonstration, Tactile cues, Verbal cues, handout Education comprehension: verbalized understanding, returned demonstration, verbal cues required, tactile cues required, and needs further education     HOME EXERCISE PROGRAM: Access Code: [email or web address] URL: [email or web address] Date: 02/17/2023 Prepared by: Fransisco Hertz  Exercises - Standing Shoulder External Rotation with Resistance  - 1 x daily - 7 x weekly - 2 sets - 10 reps - Standing Shoulder Diagonal Horizontal Abduction 60/120 Degrees with Resistance  - 1 x daily - 7 x weekly - 2 sets - 10 reps - Standing High Row with Resistance  - 1 x daily - 7 x weekly - 2 sets - 10 reps - Plank with Shoulder Flexion on Counter  - 1 x daily - 7 x weekly - 2 sets - 15 reps    ASSESSMENT:   CLINICAL IMPRESSION: 02/17/2023 Pt arrives without pain, states the only pain she has really had since last session is when she rolls onto R shoulder while sleeping. Today focusing on progression of closed chain  GH/periscapular strengthening which pt tolerates well, no pain or pinching, primary report of muscular fatigue. Only symptom today is mild pinching in anterolateral shoulder with DB raises for abduction, and this resolves with cues for reduced IR during movement. No adverse events, on departure pt denies pain, reports muscular fatigue. HEP update as above with addition of closed chain work. Recommend continuing along current POC in order to address relevant deficits and improve functional tolerance. Consider discharging in coming sessions if pt continues along current trajectory. Pt departs today's session in no acute distress, all voiced questions/concerns addressed appropriately from PT perspective.      OBJECTIVE IMPAIRMENTS: decreased activity tolerance, decreased endurance, decreased mobility, decreased ROM, decreased strength, hypomobility, impaired UE functional use, postural dysfunction, and pain.    ACTIVITY LIMITATIONS: dressing, reach over head, hygiene/grooming, and caring for others  PARTICIPATION LIMITATIONS: meal prep, cleaning, laundry, and occupation   PERSONAL FACTORS: Time since onset of injury/illness/exacerbation are also affecting patient's functional outcome.    REHAB POTENTIAL: Good   CLINICAL DECISION MAKING: Stable/uncomplicated   EVALUATION COMPLEXITY: Low     GOALS: Goals reviewed with patient? No   SHORT TERM GOALS: Target date: 01/29/2023 Pt will demonstrate appropriate understanding and performance of initially prescribed HEP in order to facilitate improved independence with management of symptoms.  Baseline: HEP provided on eval 01/29/23: independent and compliant Goal status: MET   2. Pt will score less than or equal to 60 on QuickDASH in order to demonstrate improved perception of function due to symptoms.            Baseline: 70.45  01/29/23: 63.6%             Goal status: ONGOING   LONG TERM GOALS: Target date: 02/26/2023 Pt will score 50 or less on  QuickDASH  in order to demonstrate improved perception of function due to symptoms. Baseline: 70.45 01/29/23: 63.6% Goal status: ONGOING   2. Pt will demonstrate grossly symmetrical cervical rotation ROM in order to demonstrate improved environmental awareness. Baseline: see ROM chart above 01/29/23: 65 each  Goal status: MET    3. Pt will demonstrate at least 4+/5 shoulder MMT for improved symmetry of UE strength and improved tolerance to functional movements.  Baseline: see MMT chart above Goal status: INITIAL    4. Pt will report ability to perform upper body dressing with less than 2 point increase on NPS in order to demonstrate improved tolerance to ADLs. Baseline: up to 9/10 pain with ADLs 02/03/23: is still careful : has pain with taking off coat (shoulder ext and IR ) Goal status: ONGOING   5. Pt will demonstrate grossly symmetrical grip strength in order to demonstrate improved functional strength/dexterity.            Baseline: see MMT chart above  02/10/23: 28kg left, 29 kg right            Goal status: MET   6. Pt will report at least 50% increase in overall pain in past week in order to facilitate improved tolerance to basic ADLs/mobility.             Baseline: 2-9/10 pain             Goal status: ONGOING     PLAN:   PT FREQUENCY: 1-2x/week   PT DURATION: 8 weeks (ending week of 02/27/23)   PLANNED INTERVENTIONS: Therapeutic exercises, Therapeutic activity, Neuromuscular re-education, Balance training, Gait training, Patient/Family education, Self Care, Joint mobilization, DME instructions, Aquatic Therapy, Dry Needling, Electrical stimulation, Spinal mobilization, Cryotherapy, Moist heat, Taping, Manual therapy, and Re-evaluation   PLAN FOR NEXT SESSION: long lever GH strengthening, closed chain GH/periscapular work. Consider d/c in coming visits if continues along current trajectory    Ashley Murrain PT, DPT 02/17/2023 9:32 AM

## 2023-02-17 ENCOUNTER — Ambulatory Visit: Payer: Medicaid Other | Admitting: Physical Therapy

## 2023-02-17 ENCOUNTER — Encounter: Payer: Self-pay | Admitting: Physical Therapy

## 2023-02-17 DIAGNOSIS — M25511 Pain in right shoulder: Secondary | ICD-10-CM | POA: Diagnosis not present

## 2023-02-17 DIAGNOSIS — G8929 Other chronic pain: Secondary | ICD-10-CM

## 2023-02-17 DIAGNOSIS — M25611 Stiffness of right shoulder, not elsewhere classified: Secondary | ICD-10-CM

## 2023-02-17 DIAGNOSIS — M542 Cervicalgia: Secondary | ICD-10-CM

## 2023-02-19 ENCOUNTER — Ambulatory Visit: Payer: Medicaid Other | Admitting: Physical Therapy

## 2023-02-20 NOTE — Therapy (Signed)
OUTPATIENT PHYSICAL THERAPY TREATMENT NOTE   Patient Name: Michelle Cross MRN: [id number] DOB:1980/11/10, 42 y.o., female Today's Date: 02/24/2023  PCP: Kathreen Cosier, MD   REFERRING PROVIDER: Teryl Lucy, MD    END OF SESSION:   PT End of Session - 02/24/23 0850     Visit Number 12    Number of Visits 17    Date for PT Re-Evaluation 02/26/23    Authorization Type [facility]    Authorization Time Period no auth first 27 visits    PT Start Time [phone]   late check in   PT Stop Time 0924    PT Time Calculation (min) 32 min    Activity Tolerance Patient tolerated treatment well;No increased pain    Behavior During Therapy WFL for tasks assessed/performed                  Past Medical History:  Diagnosis Date   Anxiety    Depression    Gestational diabetes    glyburide w/1st, diet control w/2nd, 3rd & 4th   Preterm labor    delivery   Past Surgical History:  Procedure Laterality Date   APPENDECTOMY     Patient Active Problem List   Diagnosis Date Noted   Gestational diabetes mellitus (GDM) affecting pregnancy, antepartum 04/23/2019   Supervision of high risk pregnancy, antepartum 12/08/2018   History of preterm delivery, currently pregnant 12/08/2018   History of gestational diabetes in prior pregnancy, currently pregnant 12/08/2018   Language barrier 12/08/2018   Multigravida of advanced maternal age 26/07/2019   GDM, class A2 05/13/2012    REFERRING DIAG: Right Shoulder pain and cervicalgia  THERAPY DIAG:  Chronic right shoulder pain  Cervicalgia  Stiffness of right shoulder, not elsewhere classified  Rationale for Evaluation and Treatment Rehabilitation  PERTINENT HISTORY:  Anxiety/depression  PRECAUTIONS: none  SUBJECTIVE:                                                                                                                                                                                      SUBJECTIVE STATEMENT:  02/24/2023  Arrives w/o pain, states she hasn't really had much pain since last session. Continues to do well. States she thinks she will be ready to d/c next session   PAIN:  Are you having pain: 0/10 Location/description: R shoulder/neck, throbbing/aching Best-worst over past week: 0-4/10  Per eval -  - aggravating factors: cold water on hand, reaching overhead and behind back, upper body dressing - Easing factors: self massage, injections       OBJECTIVE: (objective measures completed at initial evaluation unless otherwise dated)   DIAGNOSTIC FINDINGS:  Pt states  she has had XR, no remarkable findings. Unable to view in EPIC   PATIENT SURVEYS:  Quick Dash 70.45  (spanish) 01/29/23: 63.6 % (spanish)   COGNITION: Overall cognitive status: Within functional limits for tasks assessed with assistance of in person interpreter   SENSATION: WFL BIL UE    POSTURE: forward head posture, elevated R UT    PALPATION: Concordant tenderness R infraspinatus/teres, R elbow extensor bundle and triceps. Significant nontender tightness of R UT/LS and rhomboid/mid trap              CERVICAL ROM:    Active ROM A/PROM (deg) eval AROM 01/29/23  Flexion 100% s   Extension 100%   Right lateral flexion 100%   Left lateral flexion 100%   Right rotation 60 deg pain 65  Left rotation 52 deg pain 65   (Blank rows = not tested) (Key: WFL = within functional limits not formally assessed, * = concordant pain, s = stiffness/stretching sensation, NT = not tested)    UPPER EXTREMITY ROM:   A/PROM Right eval Left eval Right 01/07/23 Right  02/03/23 Right  02/10/23  Shoulder flexion 152 deg * 166 deg     Shoulder abduction 142 deg * 162 deg  165 pain with eccentric  165 Painful arc  Shoulder internal rotation    WFL    Shoulder external rotation     WFL    Elbow flexion         Elbow extension         Wrist flexion         Wrist extension          (Blank rows = not tested) (Key: WFL = within functional  limits not formally assessed, * = concordant pain, s = stiffness/stretching sensation, NT = not tested)  Comments: more pain with lowering than raising on RUE   UPPER EXTREMITY MMT:   MMT Right eval Left eval Right  01/07/23 Right 01/29/23 Right  02/10/23  Shoulder flexion     4- pain from hand to neck   4 min pain(electric)  Shoulder extension         Shoulder abduction     4- pain from hand to neck   4- mod  (electric)  Shoulder extension         Shoulder internal rotation     5 no pain    Shoulder external rotation     4 pain 4+ pain 4 no pain  Elbow flexion         Elbow extension         Grip strength 24 kg 28 kg    29 kg  (Blank rows = not tested)  (Key: WFL = within functional limits not formally assessed, * = concordant pain, s = stiffness/stretching sensation, NT = not tested)  Comments: NT on eval given symptom irritability on eval    CERVICAL SPECIAL TESTS:  NT on eval given time constraints   FUNCTIONAL TESTS:  Limited overhead reach as above   TODAY'S TREATMENT:     [facility] Adult PT Treatment:                                                DATE: 02/24/23 Therapeutic Exercise: UBE 2 min fwd/back  Counter plank + UE reach 2x12 BIL cues for reduced compensations at trunk  Counter pushup 2x15 cues for pacing  RTB 3 way elevation (flex/scap/abd) 2x8 cues for pacing and posture, home setup High>low row blue band 2x8 cues for posture Mid row blue band 2x8 cues for elbow/hand positioning Bent row RTB 2x8 BIL cues for reduced elbow compensations    [facility] Adult PT Treatment:                                                DATE: 02/17/23 Therapeutic Exercise: Modified counter plank + UE reaches 2x15 BIL cues for pacing  3 way RTB closed chain reaches at wall 3x5 BIL cues for appropriate reaching mechanics Counter push ups 2x12 cues for shoulder mechanics  2# DB 3 way raise (flex/scap/abd) 3x5 cues for shoulder mechanics  Chin tuck x10, seated  Sidelying shoulder abduction AROM  2x12 cues for pacing and full ROM, second set with 2# High>low CC row 10# x12, 13# 2x10 cues for scapular mechanics  HEP update + education w/ handout   [facility] Adult PT Treatment:                                                DATE: 02/12/23 Therapeutic Exercise: 5# suitcase carry 2x55ft, 10# x30ft BIL cues for posture  Waiter carry 4# BIL 2x25ft cues for posture  CC row 7# each UE 2x12 cues for setup and posture  CC high>low row 7# x10, 10# x10  CC lat deltoid raise 3# RUE x8 cues for setup 4# DB lat raise x8 standing, cues for appropriate ROM and setup Modified counter plank 2x45sec cues for posture and muscular tension  Shoulder flexion walkbacks at counter x10 cues for appropriate ROM and breath control  Sidelying RUE abduction AROM x10 emphasis on full ROM  HEP update + handout/education, provided w/ green band   [facility] Adult PT Treatment:                                                DATE: 02/10/23 Therapeutic Exercise: UBE L2 2 min each way  Standing bicep curl to OH press 3# x 10, 4# x 10 Scaption bilat 3# x 10  Standing scap retract with ER green x 20  Supine Green band diagonals to fatigure right Supine 4# at 90 deg -circles 10 each way Supine Right chest press 4# 2 x 10 ( with protraction)  Supine right long lever AROM shoulder flexion x 10,  2# x 10, 3# x 10  S/L shoulder ER 3# 1 x 15 S/L shoulder abduction  1 x 10 , 3# x 10 UT and levator stretches Standing forward raise x 10, 2# x 10      [facility] Adult PT Treatment:                                                DATE: 02/05/23 Therapeutic Exercise: Radial nerve glides  Standing scaption, no weight (pain with weight)  Standing bicep curl to OH press 2# x  10 Standing yellow band chest press, IR, ER x10 each  Standing Green band rows x15 Standing red band ext x 15 bilat Supine 3# at 90 deg -circles 10 each way Supine Right chest press 3# x 10 Supine right long lever AROM shoulder flexion to 90 x 10,  1# x 10,  S/L  shoulder abduction 2# 1 x 10 , 3# x 10 S/L shoulder ER 3# 2 x 10 UT and levator stretches    [facility] Adult PT Treatment:                                                DATE: 02/03/23 Therapeutic Exercise: Standing scaption 3# bilateral  Standing bicep curl to OH press 2# x 8, 3# x 8  Swiss ball flexion up wall x12 cues for lean and comfortable ROM  Green band ER + scap retract  Supine 3# at 90 deg -circles 10 each way S/L shoulder abduction 2# 1 x 10 , 3# 1 x 10 S/L shoulder ER 2# 1 x 10, 3# 1 x 10 Supine Right chest press 3# x 10 Supine right long lever AROM shoulder flexion to 90 1# x 10, 2# x 10 Supine green band diagonals x 10  Standing yellow band IR  x 10  Standing yellow band chest press x 10    [facility] Adult PT Treatment:                                                DATE: 01/29/23 Therapeutic Exercise: Standing scaption 2# bil Standing abduction 2# bil x 5 -pain S/L shoulder abduction x 8, 2# x 8 S/L ER x 5, 2 # x 8 x 2  Supine chest press 2 # right  Supine 2# at 90 deg -circles 8 each way Supine shoulder flexion with dowel 2# chest press to overhead x 8 Supine ER/IR AAROM in 45 deg abduction Supine Right IR from 90deg Er 1# x 10 Right radial nerve glide     PATIENT EDUCATION:  Education details: rationale for interventions, relevant anatomy/physiology, HEP update Person educated: Patient Education method: Explanation, Demonstration, Tactile cues, Verbal cues, handout Education comprehension: verbalized understanding, returned demonstration, verbal cues required, tactile cues required, and needs further education     HOME EXERCISE PROGRAM: Access Code: [email or web address] URL: [email or web address] Date: 02/17/2023 Prepared by: Fransisco Hertz  Exercises - Standing Shoulder External Rotation with Resistance  - 1 x daily - 7 x weekly - 2 sets - 10 reps - Standing Shoulder Diagonal Horizontal Abduction 60/120 Degrees with Resistance  - 1 x daily - 7 x weekly - 2 sets  - 10 reps - Standing High Row with Resistance  - 1 x daily - 7 x weekly - 2 sets - 10 reps - Plank with Shoulder Flexion on Counter  - 1 x daily - 7 x weekly - 2 sets - 15 reps    ASSESSMENT:   CLINICAL IMPRESSION: 02/24/2023 Pt arrives w/o pain, continues to report steady progress and states she thinks she will be ready to d/c next session. Today focusing on progression of familiar exercises and ensure increased independence w/ plan for tentative d/c next visit. Pt tolerates activity well without pain, primary report of muscular fatigue,  education on appropriate home performance. No adverse events, departs without pain. Pt departs today's session in no acute distress, all voiced questions/concerns addressed appropriately from PT perspective.     OBJECTIVE IMPAIRMENTS: decreased activity tolerance, decreased endurance, decreased mobility, decreased ROM, decreased strength, hypomobility, impaired UE functional use, postural dysfunction, and pain.    ACTIVITY LIMITATIONS: dressing, reach over head, hygiene/grooming, and caring for others   PARTICIPATION LIMITATIONS: meal prep, cleaning, laundry, and occupation   PERSONAL FACTORS: Time since onset of injury/illness/exacerbation are also affecting patient's functional outcome.    REHAB POTENTIAL: Good   CLINICAL DECISION MAKING: Stable/uncomplicated   EVALUATION COMPLEXITY: Low     GOALS: Goals reviewed with patient? No   SHORT TERM GOALS: Target date: 01/29/2023 Pt will demonstrate appropriate understanding and performance of initially prescribed HEP in order to facilitate improved independence with management of symptoms.  Baseline: HEP provided on eval 01/29/23: independent and compliant Goal status: MET   2. Pt will score less than or equal to 60 on QuickDASH in order to demonstrate improved perception of function due to symptoms.            Baseline: 70.45  01/29/23: 63.6%             Goal status: ONGOING   LONG TERM GOALS: Target  date: 02/26/2023 Pt will score 50 or less on QuickDASH  in order to demonstrate improved perception of function due to symptoms. Baseline: 70.45 01/29/23: 63.6% Goal status: ONGOING   2. Pt will demonstrate grossly symmetrical cervical rotation ROM in order to demonstrate improved environmental awareness. Baseline: see ROM chart above 01/29/23: 65 each  Goal status: MET    3. Pt will demonstrate at least 4+/5 shoulder MMT for improved symmetry of UE strength and improved tolerance to functional movements.  Baseline: see MMT chart above Goal status: INITIAL    4. Pt will report ability to perform upper body dressing with less than 2 point increase on NPS in order to demonstrate improved tolerance to ADLs. Baseline: up to 9/10 pain with ADLs 02/03/23: is still careful : has pain with taking off coat (shoulder ext and IR ) Goal status: ONGOING   5. Pt will demonstrate grossly symmetrical grip strength in order to demonstrate improved functional strength/dexterity.            Baseline: see MMT chart above  02/10/23: 28kg left, 29 kg right            Goal status: MET   6. Pt will report at least 50% increase in overall pain in past week in order to facilitate improved tolerance to basic ADLs/mobility.             Baseline: 2-9/10 pain             Goal status: ONGOING     PLAN:   PT FREQUENCY: 1-2x/week   PT DURATION: 8 weeks (ending week of 02/27/23)   PLANNED INTERVENTIONS: Therapeutic exercises, Therapeutic activity, Neuromuscular re-education, Balance training, Gait training, Patient/Family education, Self Care, Joint mobilization, DME instructions, Aquatic Therapy, Dry Needling, Electrical stimulation, Spinal mobilization, Cryotherapy, Moist heat, Taping, Manual therapy, and Re-evaluation   PLAN FOR NEXT SESSION: plan for tentative d/c if continues along current trajectory     Ashley Murrain PT, DPT 02/24/2023 11:02 AM

## 2023-02-24 ENCOUNTER — Ambulatory Visit: Payer: Medicaid Other | Admitting: Physical Therapy

## 2023-02-24 ENCOUNTER — Encounter: Payer: Self-pay | Admitting: Physical Therapy

## 2023-02-24 DIAGNOSIS — G8929 Other chronic pain: Secondary | ICD-10-CM

## 2023-02-24 DIAGNOSIS — M25511 Pain in right shoulder: Secondary | ICD-10-CM | POA: Diagnosis not present

## 2023-02-24 DIAGNOSIS — M542 Cervicalgia: Secondary | ICD-10-CM

## 2023-02-24 DIAGNOSIS — M25611 Stiffness of right shoulder, not elsewhere classified: Secondary | ICD-10-CM

## 2023-02-25 NOTE — Therapy (Signed)
OUTPATIENT PHYSICAL THERAPY TREATMENT NOTE + DISCHARGE SUMMARY    Patient Name: Michelle Cross MRN: [id number] DOB:1981/01/05, 42 y.o., female Today's Date: 02/26/2023  PHYSICAL THERAPY DISCHARGE SUMMARY  Visits from Start of Care: 13  Current functional level related to goals / functional outcomes: Able to perform all typical activities without pain or limitations    Remaining deficits: none   Education / Equipment: HEP, discharge education, follow up with provider PRN   Patient agrees to discharge. Patient goals were met. Patient is being discharged due to meeting the stated rehab goals.   PCP: Kathreen Cosier, MD   REFERRING PROVIDER: Teryl Lucy, MD    END OF SESSION:   PT End of Session - 02/26/23 0843     Visit Number 13    Number of Visits 17    Authorization Type [employer]    Authorization Time Period no auth first 27 visits    PT Start Time 0845    PT Stop Time 0920    PT Time Calculation (min) 35 min    Activity Tolerance Patient tolerated treatment well    Behavior During Therapy WFL for tasks assessed/performed                   Past Medical History:  Diagnosis Date   Anxiety    Depression    Gestational diabetes    glyburide w/1st, diet control w/2nd, 3rd & 4th   Preterm labor    delivery   Past Surgical History:  Procedure Laterality Date   APPENDECTOMY     Patient Active Problem List   Diagnosis Date Noted   Gestational diabetes mellitus (GDM) affecting pregnancy, antepartum 04/23/2019   Supervision of high risk pregnancy, antepartum 12/08/2018   History of preterm delivery, currently pregnant 12/08/2018   History of gestational diabetes in prior pregnancy, currently pregnant 12/08/2018   Language barrier 12/08/2018   Multigravida of advanced maternal age 70/07/2019   GDM, class A2 05/13/2012    REFERRING DIAG: Right Shoulder pain and cervicalgia  THERAPY DIAG:  Chronic right shoulder pain  Cervicalgia  Stiffness  of right shoulder, not elsewhere classified  Rationale for Evaluation and Treatment Rehabilitation  PERTINENT HISTORY:  Anxiety/depression  PRECAUTIONS: none  SUBJECTIVE:                                                                                                                                                                                      SUBJECTIVE STATEMENT:  02/26/2023 Pt states she hasn't had any pain over past week, feels she is able to perform all typical activities without pain or difficulty. Feels confident discharging today. Sees MD next month.  Appreciate assistance of in person interpreter.    PAIN:  Are you having pain: 0/10 Location/description: R shoulder/neck, throbbing/aching Best-worst over past week: 0/10   Per eval -  - aggravating factors: cold water on hand, reaching overhead and behind back, upper body dressing - Easing factors: self massage, injections       OBJECTIVE: (objective measures completed at initial evaluation unless otherwise dated)   DIAGNOSTIC FINDINGS:  Pt states she has had XR, no remarkable findings. Unable to view in EPIC   PATIENT SURVEYS:  Quick Dash 70.45  (spanish) 01/29/23: 63.6 % (spanish) 02/26/23: 0%    COGNITION: Overall cognitive status: Within functional limits for tasks assessed with assistance of in person interpreter   SENSATION: WFL BIL UE    POSTURE: forward head posture, elevated R UT    PALPATION: Concordant tenderness R infraspinatus/teres, R elbow extensor bundle and triceps. Significant nontender tightness of R UT/LS and rhomboid/mid trap              CERVICAL ROM:    Active ROM A/PROM (deg) eval AROM 01/29/23 AROM 02/26/23  Flexion 100% s    Extension 100%    Right lateral flexion 100%    Left lateral flexion 100%    Right rotation 60 deg pain 65   Left rotation 52 deg pain 65    (Blank rows = not tested) (Key: WFL = within functional limits not formally assessed, * = concordant pain, s =  stiffness/stretching sensation, NT = not tested)    UPPER EXTREMITY ROM:   A/PROM Right eval Left eval Right 01/07/23 Right  02/03/23 Right  02/10/23 Right 02/26/23  Shoulder flexion 152 deg * 166 deg    168 painless   Shoulder abduction 142 deg * 162 deg  165 pain with eccentric  165 Painful arc 170 painless   Shoulder internal rotation    WFL     Shoulder external rotation     WFL     Elbow flexion          Elbow extension          Wrist flexion          Wrist extension           (Blank rows = not tested) (Key: WFL = within functional limits not formally assessed, * = concordant pain, s = stiffness/stretching sensation, NT = not tested)  Comments: more pain with lowering than raising on RUE   UPPER EXTREMITY MMT:   MMT Right eval Left eval Right  01/07/23 Right 01/29/23 Right  02/10/23 R/L 02/26/23  Shoulder flexion     4- pain from hand to neck   4 min pain(electric) 5   Shoulder extension          Shoulder abduction     4- pain from hand to neck   4- mod  (electric) 5  Shoulder extension          Shoulder internal rotation     5 no pain   5  Shoulder external rotation     4 pain 4+ pain 4 no pain 5  Elbow flexion          Elbow extension          Grip strength 24 kg 28 kg    29 kg   (Blank rows = not tested)  (Key: WFL = within functional limits not formally assessed, * = concordant pain, s = stiffness/stretching sensation, NT = not tested)  Comments: NT  on eval given symptom irritability on eval    CERVICAL SPECIAL TESTS:  NT on eval given time constraints   FUNCTIONAL TESTS:  Limited overhead reach as above 02/26/23: No limitations or pain with overhead reach   TODAY'S TREATMENT:     [facility] Adult PT Treatment:                                                DATE: 02/26/23 Therapeutic Exercise: Modified plank + UE reaches 2x12 BIL B ER + scapular retraction blueband 2x10 Blue band diagonals 2x8 BIL  Blueband unilat high>low row in doorway 2x10 HEP handout +  education  Therapeutic Activity: MSK assessment + education [place] + education Education/discussion re: progress with PT, PT goals/POC, post discharge activity, monitoring symptoms, follow up with provider    PATIENT EDUCATION:  Education details: rationale for interventions, relevant anatomy/physiology, HEP review, follow up with provider, discharge education Person educated: Patient Education method: Explanation, Demonstration, Tactile cues, Verbal cues, handout Education comprehension: verbalized understanding, returned demonstration, verbal cues required, tactile cues required, and needs further education     HOME EXERCISE PROGRAM: Access Code: [email or web address] URL: [email or web address] Date: 02/17/2023 Prepared by: Fransisco Hertz  Exercises - Standing Shoulder External Rotation with Resistance  - 1 x daily - 7 x weekly - 2 sets - 10 reps - Standing Shoulder Diagonal Horizontal Abduction 60/120 Degrees with Resistance  - 1 x daily - 7 x weekly - 2 sets - 10 reps - Standing High Row with Resistance  - 1 x daily - 7 x weekly - 2 sets - 10 reps - Plank with Shoulder Flexion on Counter  - 1 x daily - 7 x weekly - 2 sets - 15 reps    ASSESSMENT:   CLINICAL IMPRESSION: 02/26/2023 Pt arrives w/o pain, states she continues to feel notable improvement and is confident discharging today. Denies any limitations or pain with activity recently, notable improvement in cervical/GH mobility and strength compared to start of care. All LTG's have been met (see below). Pt tolerates HEP well without issue. Pt denies any questions/concerns, states she has a follow up scheduled with MD next month. At present, recommend discharge to independent HEP, pt verbalizes agreement/understanding with plan and departs clinic in no acute distress.    OBJECTIVE IMPAIRMENTS: NA   ACTIVITY LIMITATIONS: denies limitations   PARTICIPATION LIMITATIONS: denies limitations   PERSONAL FACTORS: Time since  onset of injury/illness/exacerbation are also affecting patient's functional outcome.    REHAB POTENTIAL: Good   CLINICAL DECISION MAKING: Stable/uncomplicated   EVALUATION COMPLEXITY: Low     GOALS: Goals reviewed with patient? No   SHORT TERM GOALS: Target date: 01/29/2023 Pt will demonstrate appropriate understanding and performance of initially prescribed HEP in order to facilitate improved independence with management of symptoms.  Baseline: HEP provided on eval 01/29/23: independent and compliant Goal status: MET   2. Pt will score less than or equal to 60 on QuickDASH in order to demonstrate improved perception of function due to symptoms.            Baseline: 70.45  01/29/23: 63.6%  02/26/23: 0%  Goal status: MET   LONG TERM GOALS: Target date: 02/26/2023 Pt will score 50 or less on QuickDASH  in order to demonstrate improved perception of function due to symptoms. Baseline: 70.45 01/29/23: 63.6% 02/26/23: 0%  Goal status: MET  2. Pt will demonstrate grossly symmetrical cervical rotation ROM in order to demonstrate improved environmental awareness. Baseline: see ROM chart above 01/29/23: 65 each  Goal status: MET    3. Pt will demonstrate at least 4+/5 shoulder MMT for improved symmetry of UE strength and improved tolerance to functional movements.  Baseline: see MMT chart above 02/26/23: 5/5 globally   Goal status: MET   4. Pt will report ability to perform upper body dressing with less than 2 point increase on NPS in order to demonstrate improved tolerance to ADLs. Baseline: up to 9/10 pain with ADLs 02/03/23: is still careful : has pain with taking off coat (shoulder ext and IR ) 02/26/23: no pain with typical activities  Goal status: MET   5. Pt will demonstrate grossly symmetrical grip strength in order to demonstrate improved functional strength/dexterity.            Baseline: see MMT chart above  02/10/23: 28kg left, 29 kg right            Goal status: MET   6. Pt  will report at least 50% increase in overall pain in past week in order to facilitate improved tolerance to basic ADLs/mobility.             Baseline: 2-9/10 pain   02/26/23: 0/10 pain over last 7 days             Goal status: MET      PLAN: DISCHARGE 02/26/23   PT FREQUENCY: NA   PT DURATION: NA   PLANNED INTERVENTIONS: Therapeutic exercises, Therapeutic activity, Neuromuscular re-education, Balance training, Gait training, Patient/Family education, Self Care, Joint mobilization, DME instructions, Aquatic Therapy, Dry Needling, Electrical stimulation, Spinal mobilization, Cryotherapy, Moist heat, Taping, Manual therapy, and Re-evaluation   PLAN FOR NEXT SESSION: discharge to independent HEP, follow up with provider as needed     Ashley Murrain PT, DPT 02/26/2023 10:27 AM

## 2023-02-26 ENCOUNTER — Encounter: Payer: Self-pay | Admitting: Physical Therapy

## 2023-02-26 ENCOUNTER — Ambulatory Visit: Payer: Medicaid Other | Admitting: Physical Therapy

## 2023-02-26 DIAGNOSIS — G8929 Other chronic pain: Secondary | ICD-10-CM

## 2023-02-26 DIAGNOSIS — M25611 Stiffness of right shoulder, not elsewhere classified: Secondary | ICD-10-CM

## 2023-02-26 DIAGNOSIS — M542 Cervicalgia: Secondary | ICD-10-CM

## 2023-02-26 DIAGNOSIS — M25511 Pain in right shoulder: Secondary | ICD-10-CM | POA: Diagnosis not present

## 2023-04-24 NOTE — Progress Notes (Signed)
[facility] - Clinic Note  04/27/2023     CHIEF COMPLAINT Patient presents for Retina Evaluation   HISTORY OF PRESENT ILLNESS: Michelle Cross is a 42 y.o. female who presents to the clinic today for:   HPI     Retina Evaluation   In both eyes.  This started 4 years ago.  Duration of 8 months.  Associated Symptoms Fatigue.  Context:  mid-range vision and near vision.  Response to treatment was no improvement.  I, the attending physician,  performed the HPI with the patient and updated documentation appropriately.        Comments   Pt here for diabetic eval/blurry VA OU. Pt states she has not experienced flashes of light, just having difficulty seeing up close and blurry vision that comes and goes since November. Pt has a hx of gestational diabetes, last pregnancy was 4 years ago and the diabetes did stay has a permanent diagnosis. Unsure of most recent A1C. Blood sugar this am was 122. Pt only on Metformin.       Last edited by Rennis Chris, MD on 04/27/2023 12:48 PM.     Patient she is having a hard time to see, She is seeing flashes and blurry vision when she is real tired and reading.   Referring physician: [facility] [address] [place],  [place] [address]  HISTORICAL INFORMATION:   Selected notes from the MEDICAL RECORD NUMBER Referred by Dr. Zenaida Niece ([facility]) for flashes of light and diabetic eval LEE:  Ocular Hx- PMH-    CURRENT MEDICATIONS: No current outpatient medications on file. (Ophthalmic Drugs)   No current facility-administered medications for this visit. (Ophthalmic Drugs)   Current Outpatient Medications (Other)  Medication Sig   metformin (FORTAMET) 500 MG (OSM) 24 hr tablet Take 500 mg by mouth 2 (two) times daily with a meal.   acetaminophen (TYLENOL) 325 MG tablet Take 2 tablets (650 mg total) by mouth every 4 (four) hours as needed (for pain scale < 4). (Patient not  taking: Reported on 04/27/2023)   aspirin EC 81 MG tablet Take 1 tablet (81 mg total) by mouth daily. (Patient not taking: Reported on 04/27/2023)   ibuprofen (ADVIL) 600 MG tablet Take 1 tablet (600 mg total) by mouth every 6 (six) hours. (Patient not taking: Reported on 04/27/2023)   insulin NPH Human (NOVOLIN N) 100 UNIT/ML injection Inject SQ 22 units every morning with breakfast and 10 units at bedtime. (Patient not taking: Reported on 04/27/2023)   insulin regular (NOVOLIN R) 100 units/mL injection Inject SQ 8 units with breakfast and 8 units with supper (evening meal) (Patient not taking: Reported on 04/27/2023)   Insulin Syringe-Needle U-100 (INSULIN SYRINGE .5CC/31GX5/16") 31G X 5/16" 0.5 ML MISC 1 Device by Does not apply route 3 (three) times daily. (Patient not taking: Reported on 04/27/2023)   omeprazole (PRILOSEC) 20 MG capsule Take 1 capsule (20 mg total) by mouth daily. (Patient not taking: Reported on 04/27/2023)   Prenatal Multivit-Min-Fe-FA (PRENATAL VITAMINS PO) Take 1 tablet by mouth daily.  (Patient not taking: Reported on 04/27/2023)   Current Facility-Administered Medications (Other)  Medication Route   insulin starter kit- syringes (English) 1 kit Other      REVIEW OF SYSTEMS: ROS   Positive for: Endocrine, Eyes Negative for: Constitutional, Gastrointestinal, Neurological, Skin, Genitourinary, Musculoskeletal, HENT, Cardiovascular, Respiratory, Psychiatric, Allergic/Imm, Heme/Lymph Last edited by Thompson Grayer, COT on 04/27/2023  9:05 AM.  ALLERGIES No Known Allergies  PAST MEDICAL HISTORY Past Medical History:  Diagnosis Date   Anxiety    Depression    Gestational diabetes    glyburide w/1st, diet control w/2nd, 3rd & 4th   Preterm labor    delivery   Past Surgical History:  Procedure Laterality Date   APPENDECTOMY      FAMILY HISTORY Family History  Problem Relation Age of Onset   Diabetes Father    Cataracts Father    Diabetes Brother     Hypertension Brother    Anesthesia problems Neg Hx     SOCIAL HISTORY Social History   Tobacco Use   Smoking status: Never   Smokeless tobacco: Never  Vaping Use   Vaping status: Never Used  Substance Use Topics   Alcohol use: Not Currently    Alcohol/week: 1.0 standard drink of alcohol    Types: 1 Glasses of wine per week    Comment: occasional   Drug use: No       OPHTHALMIC EXAM:  Base Eye Exam     Visual Acuity (Snellen - Linear)       Right Left   Dist [place] 20/20 20/20         Tonometry (Tonopen, 9:10 AM)       Right Left   Pressure 14 13         Pupils       Pupils Dark Light Shape React APD   Right PERRL 3 2 Round Brisk None   Left PERRL 3 2 Round Brisk None         Visual Fields (Counting fingers)       Left Right    Full Full         Extraocular Movement       Right Left    Full, Ortho Full, Ortho         Neuro/Psych     Oriented x3: Yes   Mood/Affect: Normal         Dilation     Both eyes: 1.0% Mydriacyl, 2.5% Phenylephrine @ 9:11 AM           Slit Lamp and Fundus Exam     External Exam       Right Left   External Normal Normal         Slit Lamp Exam       Right Left   Lids/Lashes Normal Normal   Conjunctiva/Sclera Nasal Pinguecula Nasal and temporal Pinguecula   Cornea Clear Clear   Anterior Chamber Deep and clear Deep and clear   Iris Round and dilated Round and dilated   Lens Trace cortical changes Trace cortical changes   Anterior Vitreous Vitreous syneresis Vitreous syneresis         Fundus Exam       Right Left   Disc Pink and sharp, mild PPP Pink and sharp, mild PPP   C/D Ratio 0.6 0.5   Macula Flat, Good foveal reflex, mild RPE mottling, No heme or edema Flat, Blunted foveal reflex, focal PED temporal fovea, RPE mottling, no heme/edema   Vessels Normal Normal   Periphery Attached, No heme Attached, No heme            IMAGING AND PROCEDURES  Imaging and Procedures for  04/27/2023  OCT, Retina - OU - Both Eyes       Right Eye Quality was good. Central Foveal Thickness: 256. Progression has no prior data. Findings include normal foveal contour, no  IRF, no SRF, retinal drusen , vitreomacular adhesion (Rare Drusen).   Left Eye Quality was good. Central Foveal Thickness: 246. Progression has no prior data. Findings include normal foveal contour, no IRF, no SRF, retinal drusen , vitreomacular adhesion (Focal drusen IT fovea).   Notes *Images captured and stored on drive  Diagnosis / Impression:  No DME OU OD: rare drusen OS: Focal PED/drusen IT fovea  Clinical management:  See below  Abbreviations: NFP - Normal foveal profile. CME - cystoid macular edema. PED - pigment epithelial detachment. IRF - intraretinal fluid. SRF - subretinal fluid. EZ - ellipsoid zone. ERM - epiretinal membrane. ORA - outer retinal atrophy. ORT - outer retinal tubulation. SRHM - subretinal hyper-reflective material. IRHM - intraretinal hyper-reflective material            ASSESSMENT/PLAN:    ICD-10-CM   1. Diabetes mellitus type 2 without retinopathy (HCC)  E11.9 OCT, Retina - OU - Both Eyes    2. Encounter for long-term (current) insulin use (HCC)  Z79.4     3. 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 Bilateral dry eyes  H04.123      1,2. Diabetes mellitus, type 2 without retinopathy  - Last A1C 5.3 on 12/14/18 - The incidence, risk factors for progression, natural history and treatment options for diabetic retinopathy  were discussed with patient.   - The need for close monitoring of blood glucose, blood pressure, and serum lipids, avoiding cigarette or any type of tobacco, and the need for long term follow up was also discussed with patient. - OCT shows no DME   - f/u PRN  3. 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 Dry eyes OU - recommend artificial tears and lubricating ointment as needed   Ophthalmic Meds Ordered this visit:   No orders of the defined types were placed in this encounter.      Return if symptoms worsen or fail to improve, for f/u Diabetes .  There are no Patient Instructions on file for this visit.   Explained the diagnoses, plan, and follow up with the patient and they expressed understanding.  Patient expressed understanding of the importance of proper follow up care.   This document serves as a record of services personally performed by Karie Chimera, MD, PhD. It was created on their behalf by Annalee Genta, COMT. The creation of this record is the provider's dictation and/or activities during the visit.  Electronically signed by: Annalee Genta, COMT 04/27/23 12:53 PM  This document serves as a record of services personally performed by Karie Chimera, MD, PhD. It was created on their behalf by Gerilyn Nestle, COT an ophthalmic technician. The creation of this record is the provider's dictation and/or activities during the visit.    Electronically signed by:  Charlette Caffey, COT  04/27/23 12:53 PM   Karie Chimera, M.D., Ph.D. Diseases & Surgery of the Retina and Vitreous [facility] 04/27/23  I have reviewed the above documentation for accuracy and completeness, and I agree with the above. Karie Chimera, M.D., Ph.D. 04/27/23 12:53 PM   Abbreviations: M myopia (nearsighted); A astigmatism; H hyperopia (farsighted); P presbyopia; Mrx spectacle prescription;  CTL contact lenses; OD right eye; OS left eye; OU both eyes  XT exotropia; ET esotropia; PEK punctate epithelial keratitis; PEE punctate epithelial erosions; DES dry eye syndrome; MGD meibomian gland dysfunction; ATs artificial tears; PFAT's preservative free  artificial tears; NSC nuclear sclerotic cataract; PSC posterior subcapsular cataract; ERM epi-retinal membrane; PVD posterior vitreous detachment; RD retinal detachment; DM diabetes mellitus; DR diabetic retinopathy; NPDR non-proliferative  diabetic retinopathy; PDR proliferative diabetic retinopathy; CSME clinically significant macular edema; DME diabetic macular edema; dbh dot blot hemorrhages; CWS cotton wool spot; POAG primary open angle glaucoma; C/D cup-to-disc ratio; HVF humphrey visual field; GVF goldmann visual field; OCT optical coherence tomography; IOP intraocular pressure; BRVO Branch retinal vein occlusion; CRVO central retinal vein occlusion; CRAO central retinal artery occlusion; BRAO branch retinal artery occlusion; RT retinal tear; SB scleral buckle; PPV pars plana vitrectomy; VH Vitreous hemorrhage; PRP panretinal laser photocoagulation; IVK intravitreal kenalog; VMT vitreomacular traction; MH Macular hole;  NVD neovascularization of the disc; NVE neovascularization elsewhere; AREDS age related eye disease study; ARMD age related macular degeneration; POAG primary open angle glaucoma; EBMD epithelial/anterior basement membrane dystrophy; ACIOL anterior chamber intraocular lens; IOL intraocular lens; PCIOL posterior chamber intraocular lens; Phaco/IOL phacoemulsification with intraocular lens placement; PRK photorefractive keratectomy; LASIK laser assisted in situ keratomileusis; HTN hypertension; DM diabetes mellitus; COPD chronic obstructive pulmonary disease

## 2023-04-27 ENCOUNTER — Encounter (INDEPENDENT_AMBULATORY_CARE_PROVIDER_SITE_OTHER): Payer: Self-pay | Admitting: Ophthalmology

## 2023-04-27 ENCOUNTER — Ambulatory Visit (INDEPENDENT_AMBULATORY_CARE_PROVIDER_SITE_OTHER): Payer: Medicaid Other | Admitting: Ophthalmology

## 2023-04-27 DIAGNOSIS — E119 Type 2 diabetes mellitus without complications: Secondary | ICD-10-CM | POA: Diagnosis not present

## 2023-04-27 DIAGNOSIS — H04123 Dry eye syndrome of bilateral lacrimal glands: Secondary | ICD-10-CM

## 2023-04-27 DIAGNOSIS — H35722 Serous detachment of retinal pigment epithelium, left eye: Secondary | ICD-10-CM

## 2023-04-27 DIAGNOSIS — Z794 Long term (current) use of insulin: Secondary | ICD-10-CM
